# Patient Record
Sex: Female | Born: 1945 | Race: Black or African American | Hispanic: No | Marital: Single | Smoking: Former smoker
Health system: Southern US, Community
[De-identification: ages and names within clinical notes are randomized; demographics above are authoritative.]

## PROBLEM LIST (undated history)

## (undated) DIAGNOSIS — M1990 Unspecified osteoarthritis, unspecified site: Secondary | ICD-10-CM

## (undated) DIAGNOSIS — I10 Essential (primary) hypertension: Secondary | ICD-10-CM

## (undated) DIAGNOSIS — M329 Systemic lupus erythematosus, unspecified: Secondary | ICD-10-CM

## (undated) DIAGNOSIS — E7800 Pure hypercholesterolemia, unspecified: Secondary | ICD-10-CM

## (undated) DIAGNOSIS — G629 Polyneuropathy, unspecified: Secondary | ICD-10-CM

## (undated) DIAGNOSIS — E119 Type 2 diabetes mellitus without complications: Secondary | ICD-10-CM

## (undated) DIAGNOSIS — IMO0002 Reserved for concepts with insufficient information to code with codable children: Secondary | ICD-10-CM

## (undated) HISTORY — PX: BACK SURGERY: SHX140

## (undated) HISTORY — PX: LAPAROSCOPIC TUBAL LIGATION: SUR803

---

## 1999-01-31 ENCOUNTER — Encounter: Payer: Self-pay | Admitting: Cardiology

## 1999-01-31 ENCOUNTER — Ambulatory Visit (HOSPITAL_COMMUNITY): Admission: RE | Admit: 1999-01-31 | Discharge: 1999-01-31 | Payer: Self-pay | Admitting: Cardiology

## 1999-11-11 ENCOUNTER — Emergency Department (HOSPITAL_COMMUNITY): Admission: EM | Admit: 1999-11-11 | Discharge: 1999-11-11 | Payer: Self-pay | Admitting: Emergency Medicine

## 2000-12-13 ENCOUNTER — Other Ambulatory Visit: Admission: RE | Admit: 2000-12-13 | Discharge: 2000-12-13 | Payer: Self-pay | Admitting: Obstetrics and Gynecology

## 2000-12-20 ENCOUNTER — Encounter: Payer: Self-pay | Admitting: Obstetrics and Gynecology

## 2000-12-20 ENCOUNTER — Ambulatory Visit (HOSPITAL_COMMUNITY): Admission: RE | Admit: 2000-12-20 | Discharge: 2000-12-20 | Payer: Self-pay | Admitting: Obstetrics and Gynecology

## 2001-01-03 ENCOUNTER — Encounter: Payer: Self-pay | Admitting: Cardiology

## 2001-01-03 ENCOUNTER — Encounter: Admission: RE | Admit: 2001-01-03 | Discharge: 2001-01-03 | Payer: Self-pay | Admitting: Cardiology

## 2001-02-24 ENCOUNTER — Ambulatory Visit (HOSPITAL_COMMUNITY): Admission: RE | Admit: 2001-02-24 | Discharge: 2001-02-24 | Payer: Self-pay | Admitting: Cardiology

## 2001-02-24 ENCOUNTER — Encounter: Payer: Self-pay | Admitting: Cardiology

## 2001-03-07 ENCOUNTER — Ambulatory Visit (HOSPITAL_COMMUNITY): Admission: RE | Admit: 2001-03-07 | Discharge: 2001-03-07 | Payer: Self-pay | Admitting: Cardiology

## 2001-08-28 ENCOUNTER — Ambulatory Visit (HOSPITAL_COMMUNITY): Admission: RE | Admit: 2001-08-28 | Discharge: 2001-08-28 | Payer: Self-pay | Admitting: *Deleted

## 2001-08-31 ENCOUNTER — Ambulatory Visit (HOSPITAL_COMMUNITY): Admission: RE | Admit: 2001-08-31 | Discharge: 2001-08-31 | Payer: Self-pay | Admitting: Obstetrics and Gynecology

## 2001-08-31 ENCOUNTER — Encounter: Payer: Self-pay | Admitting: Obstetrics and Gynecology

## 2001-09-01 ENCOUNTER — Ambulatory Visit (HOSPITAL_COMMUNITY): Admission: RE | Admit: 2001-09-01 | Discharge: 2001-09-01 | Payer: Self-pay | Admitting: *Deleted

## 2001-11-06 ENCOUNTER — Encounter: Admission: RE | Admit: 2001-11-06 | Discharge: 2001-11-06 | Payer: Self-pay | Admitting: Cardiology

## 2001-11-06 ENCOUNTER — Encounter: Payer: Self-pay | Admitting: Cardiology

## 2001-11-10 ENCOUNTER — Ambulatory Visit (HOSPITAL_COMMUNITY): Admission: RE | Admit: 2001-11-10 | Discharge: 2001-11-10 | Payer: Self-pay | Admitting: Obstetrics and Gynecology

## 2001-11-10 ENCOUNTER — Encounter (INDEPENDENT_AMBULATORY_CARE_PROVIDER_SITE_OTHER): Payer: Self-pay

## 2002-02-23 ENCOUNTER — Ambulatory Visit (HOSPITAL_COMMUNITY): Admission: RE | Admit: 2002-02-23 | Discharge: 2002-02-23 | Payer: Self-pay | Admitting: *Deleted

## 2002-02-23 ENCOUNTER — Encounter (INDEPENDENT_AMBULATORY_CARE_PROVIDER_SITE_OTHER): Payer: Self-pay | Admitting: Specialist

## 2002-03-27 ENCOUNTER — Other Ambulatory Visit: Admission: RE | Admit: 2002-03-27 | Discharge: 2002-03-27 | Payer: Self-pay | Admitting: Obstetrics and Gynecology

## 2002-06-12 ENCOUNTER — Encounter: Payer: Self-pay | Admitting: Cardiology

## 2002-06-12 ENCOUNTER — Encounter: Admission: RE | Admit: 2002-06-12 | Discharge: 2002-06-12 | Payer: Self-pay | Admitting: Cardiology

## 2002-12-17 ENCOUNTER — Encounter: Payer: Self-pay | Admitting: Cardiology

## 2002-12-17 ENCOUNTER — Encounter: Admission: RE | Admit: 2002-12-17 | Discharge: 2002-12-17 | Payer: Self-pay | Admitting: Cardiology

## 2003-04-08 ENCOUNTER — Other Ambulatory Visit: Admission: RE | Admit: 2003-04-08 | Discharge: 2003-04-08 | Payer: Self-pay | Admitting: Obstetrics and Gynecology

## 2003-05-06 ENCOUNTER — Encounter: Payer: Self-pay | Admitting: Cardiology

## 2003-05-06 ENCOUNTER — Encounter: Admission: RE | Admit: 2003-05-06 | Discharge: 2003-05-06 | Payer: Self-pay | Admitting: Cardiology

## 2003-06-05 ENCOUNTER — Ambulatory Visit (HOSPITAL_BASED_OUTPATIENT_CLINIC_OR_DEPARTMENT_OTHER): Admission: RE | Admit: 2003-06-05 | Discharge: 2003-06-05 | Payer: Self-pay | Admitting: Urology

## 2003-06-05 ENCOUNTER — Encounter (INDEPENDENT_AMBULATORY_CARE_PROVIDER_SITE_OTHER): Payer: Self-pay | Admitting: Specialist

## 2004-01-20 ENCOUNTER — Encounter: Admission: RE | Admit: 2004-01-20 | Discharge: 2004-01-20 | Payer: Self-pay | Admitting: Urology

## 2004-01-22 ENCOUNTER — Ambulatory Visit (HOSPITAL_COMMUNITY): Admission: RE | Admit: 2004-01-22 | Discharge: 2004-01-22 | Payer: Self-pay | Admitting: Urology

## 2004-01-22 ENCOUNTER — Ambulatory Visit (HOSPITAL_BASED_OUTPATIENT_CLINIC_OR_DEPARTMENT_OTHER): Admission: RE | Admit: 2004-01-22 | Discharge: 2004-01-22 | Payer: Self-pay | Admitting: Urology

## 2004-04-22 ENCOUNTER — Ambulatory Visit (HOSPITAL_COMMUNITY): Admission: RE | Admit: 2004-04-22 | Discharge: 2004-04-22 | Payer: Self-pay | Admitting: Urology

## 2004-04-22 ENCOUNTER — Ambulatory Visit (HOSPITAL_BASED_OUTPATIENT_CLINIC_OR_DEPARTMENT_OTHER): Admission: RE | Admit: 2004-04-22 | Discharge: 2004-04-22 | Payer: Self-pay | Admitting: Urology

## 2004-07-22 ENCOUNTER — Encounter (INDEPENDENT_AMBULATORY_CARE_PROVIDER_SITE_OTHER): Payer: Self-pay | Admitting: Specialist

## 2004-07-22 ENCOUNTER — Ambulatory Visit (HOSPITAL_BASED_OUTPATIENT_CLINIC_OR_DEPARTMENT_OTHER): Admission: RE | Admit: 2004-07-22 | Discharge: 2004-07-22 | Payer: Self-pay | Admitting: Urology

## 2004-07-22 ENCOUNTER — Ambulatory Visit (HOSPITAL_COMMUNITY): Admission: RE | Admit: 2004-07-22 | Discharge: 2004-07-22 | Payer: Self-pay | Admitting: Urology

## 2005-07-29 ENCOUNTER — Ambulatory Visit: Payer: Self-pay | Admitting: Internal Medicine

## 2005-08-30 ENCOUNTER — Ambulatory Visit: Payer: Self-pay | Admitting: Internal Medicine

## 2005-09-06 ENCOUNTER — Encounter (INDEPENDENT_AMBULATORY_CARE_PROVIDER_SITE_OTHER): Payer: Self-pay | Admitting: *Deleted

## 2005-09-06 ENCOUNTER — Ambulatory Visit: Payer: Self-pay | Admitting: Internal Medicine

## 2006-07-12 ENCOUNTER — Other Ambulatory Visit: Admission: RE | Admit: 2006-07-12 | Discharge: 2006-07-12 | Payer: Self-pay | Admitting: Obstetrics and Gynecology

## 2007-02-03 ENCOUNTER — Encounter: Admission: RE | Admit: 2007-02-03 | Discharge: 2007-02-03 | Payer: Self-pay | Admitting: Urology

## 2007-02-08 ENCOUNTER — Ambulatory Visit (HOSPITAL_BASED_OUTPATIENT_CLINIC_OR_DEPARTMENT_OTHER): Admission: RE | Admit: 2007-02-08 | Discharge: 2007-02-08 | Payer: Self-pay | Admitting: Urology

## 2007-02-08 ENCOUNTER — Encounter (INDEPENDENT_AMBULATORY_CARE_PROVIDER_SITE_OTHER): Payer: Self-pay | Admitting: Specialist

## 2007-02-12 ENCOUNTER — Emergency Department (HOSPITAL_COMMUNITY): Admission: EM | Admit: 2007-02-12 | Discharge: 2007-02-12 | Payer: Self-pay | Admitting: Emergency Medicine

## 2007-05-31 ENCOUNTER — Encounter (INDEPENDENT_AMBULATORY_CARE_PROVIDER_SITE_OTHER): Payer: Self-pay | Admitting: Urology

## 2007-05-31 ENCOUNTER — Ambulatory Visit (HOSPITAL_BASED_OUTPATIENT_CLINIC_OR_DEPARTMENT_OTHER): Admission: RE | Admit: 2007-05-31 | Discharge: 2007-05-31 | Payer: Self-pay | Admitting: Urology

## 2008-03-19 ENCOUNTER — Encounter: Admission: RE | Admit: 2008-03-19 | Discharge: 2008-03-19 | Payer: Self-pay | Admitting: *Deleted

## 2008-08-15 ENCOUNTER — Encounter (INDEPENDENT_AMBULATORY_CARE_PROVIDER_SITE_OTHER): Payer: Self-pay | Admitting: Obstetrics and Gynecology

## 2008-08-15 ENCOUNTER — Ambulatory Visit (HOSPITAL_COMMUNITY): Admission: RE | Admit: 2008-08-15 | Discharge: 2008-08-15 | Payer: Self-pay | Admitting: Obstetrics and Gynecology

## 2008-12-30 ENCOUNTER — Encounter: Admission: RE | Admit: 2008-12-30 | Discharge: 2008-12-30 | Payer: Self-pay | Admitting: Cardiology

## 2009-02-27 ENCOUNTER — Encounter: Admission: RE | Admit: 2009-02-27 | Discharge: 2009-02-27 | Payer: Self-pay | Admitting: Obstetrics and Gynecology

## 2009-05-24 ENCOUNTER — Encounter: Payer: Self-pay | Admitting: Emergency Medicine

## 2009-05-25 ENCOUNTER — Encounter (INDEPENDENT_AMBULATORY_CARE_PROVIDER_SITE_OTHER): Payer: Self-pay | Admitting: Cardiology

## 2009-05-25 ENCOUNTER — Observation Stay (HOSPITAL_COMMUNITY): Admission: EM | Admit: 2009-05-25 | Discharge: 2009-05-30 | Payer: Self-pay | Admitting: Cardiology

## 2009-05-28 ENCOUNTER — Ambulatory Visit: Payer: Self-pay | Admitting: Pulmonary Disease

## 2010-02-27 DIAGNOSIS — Z8603 Personal history of neoplasm of uncertain behavior: Secondary | ICD-10-CM | POA: Insufficient documentation

## 2010-02-27 DIAGNOSIS — H409 Unspecified glaucoma: Secondary | ICD-10-CM | POA: Insufficient documentation

## 2010-02-27 DIAGNOSIS — M329 Systemic lupus erythematosus, unspecified: Secondary | ICD-10-CM | POA: Insufficient documentation

## 2010-05-01 ENCOUNTER — Emergency Department (HOSPITAL_COMMUNITY): Admission: EM | Admit: 2010-05-01 | Discharge: 2010-05-01 | Payer: Self-pay | Admitting: Emergency Medicine

## 2010-11-05 ENCOUNTER — Other Ambulatory Visit: Payer: Self-pay | Admitting: Family Medicine

## 2010-11-05 DIAGNOSIS — Z780 Asymptomatic menopausal state: Secondary | ICD-10-CM

## 2010-11-05 DIAGNOSIS — Z1239 Encounter for other screening for malignant neoplasm of breast: Secondary | ICD-10-CM

## 2010-12-01 ENCOUNTER — Ambulatory Visit
Admission: RE | Admit: 2010-12-01 | Discharge: 2010-12-01 | Disposition: A | Discharge status: Home / Self Care | Payer: PRIVATE HEALTH INSURANCE | Source: Ambulatory Visit | Attending: Family Medicine | Admitting: Family Medicine

## 2010-12-01 DIAGNOSIS — Z780 Asymptomatic menopausal state: Secondary | ICD-10-CM

## 2010-12-01 DIAGNOSIS — Z1239 Encounter for other screening for malignant neoplasm of breast: Secondary | ICD-10-CM

## 2010-12-07 DIAGNOSIS — M8580 Other specified disorders of bone density and structure, unspecified site: Secondary | ICD-10-CM | POA: Insufficient documentation

## 2010-12-26 LAB — URINALYSIS, ROUTINE W REFLEX MICROSCOPIC
Bilirubin Urine: NEGATIVE
Glucose, UA: NEGATIVE mg/dL
Hgb urine dipstick: NEGATIVE
Ketones, ur: NEGATIVE mg/dL
Nitrite: NEGATIVE
Protein, ur: NEGATIVE mg/dL
Specific Gravity, Urine: 1.008 (ref 1.005–1.030)
Urobilinogen, UA: 0.2 mg/dL (ref 0.0–1.0)
pH: 7 (ref 5.0–8.0)

## 2010-12-26 LAB — CBC
HCT: 44.9 % (ref 36.0–46.0)
Hemoglobin: 15.2 g/dL — ABNORMAL HIGH (ref 12.0–15.0)
MCH: 27.4 pg (ref 26.0–34.0)
MCHC: 33.8 g/dL (ref 30.0–36.0)
MCV: 81.1 fL (ref 78.0–100.0)
Platelets: 221 10*3/uL (ref 150–400)
RBC: 5.54 MIL/uL — ABNORMAL HIGH (ref 3.87–5.11)
RDW: 13.5 % (ref 11.5–15.5)
WBC: 6.2 10*3/uL (ref 4.0–10.5)

## 2010-12-26 LAB — POCT I-STAT, CHEM 8
BUN: 5 mg/dL — ABNORMAL LOW (ref 6–23)
Calcium, Ion: 1.08 mmol/L — ABNORMAL LOW (ref 1.12–1.32)
Chloride: 102 mEq/L (ref 96–112)
Creatinine, Ser: 0.9 mg/dL (ref 0.4–1.2)
Glucose, Bld: 105 mg/dL — ABNORMAL HIGH (ref 70–99)
HCT: 47 % — ABNORMAL HIGH (ref 36.0–46.0)
Hemoglobin: 16 g/dL — ABNORMAL HIGH (ref 12.0–15.0)
Potassium: 3.4 mEq/L — ABNORMAL LOW (ref 3.5–5.1)
Sodium: 137 mEq/L (ref 135–145)
TCO2: 26 mmol/L (ref 0–100)

## 2011-01-04 DIAGNOSIS — E1142 Type 2 diabetes mellitus with diabetic polyneuropathy: Secondary | ICD-10-CM | POA: Insufficient documentation

## 2011-01-04 DIAGNOSIS — I10 Essential (primary) hypertension: Secondary | ICD-10-CM | POA: Insufficient documentation

## 2011-01-04 DIAGNOSIS — E119 Type 2 diabetes mellitus without complications: Secondary | ICD-10-CM | POA: Insufficient documentation

## 2011-01-05 DIAGNOSIS — E119 Type 2 diabetes mellitus without complications: Secondary | ICD-10-CM | POA: Insufficient documentation

## 2011-01-05 DIAGNOSIS — E1169 Type 2 diabetes mellitus with other specified complication: Secondary | ICD-10-CM | POA: Insufficient documentation

## 2011-01-16 LAB — ANGIOTENSIN CONVERTING ENZYME: Angiotensin-Converting Enzyme: 31 U/L (ref 9–67)

## 2011-01-16 LAB — COMPREHENSIVE METABOLIC PANEL
ALT: 32 U/L (ref 0–35)
AST: 38 U/L — ABNORMAL HIGH (ref 0–37)
Albumin: 3.9 g/dL (ref 3.5–5.2)
Alkaline Phosphatase: 67 U/L (ref 39–117)
BUN: 10 mg/dL (ref 6–23)
CO2: 29 mEq/L (ref 19–32)
Calcium: 9.5 mg/dL (ref 8.4–10.5)
Chloride: 102 mEq/L (ref 96–112)
Creatinine, Ser: 0.9 mg/dL (ref 0.4–1.2)
GFR calc Af Amer: >60 mL/min (ref >60)
GFR calc non Af Amer: >60 mL/min (ref >60)
Glucose, Bld: 148 mg/dL — ABNORMAL HIGH (ref 70–99)
Potassium: 3.3 mEq/L — ABNORMAL LOW (ref 3.5–5.1)
Sodium: 139 mEq/L (ref 135–145)
Total Bilirubin: 0.5 mg/dL (ref 0.3–1.2)
Total Protein: 7.1 g/dL (ref 6.0–8.3)

## 2011-01-16 LAB — GLUCOSE, CAPILLARY
Glucose-Capillary: 110 mg/dL — ABNORMAL HIGH (ref 70–99)
Glucose-Capillary: 126 mg/dL — ABNORMAL HIGH (ref 70–99)
Glucose-Capillary: 126 mg/dL — ABNORMAL HIGH (ref 70–99)
Glucose-Capillary: 126 mg/dL — ABNORMAL HIGH (ref 70–99)
Glucose-Capillary: 128 mg/dL — ABNORMAL HIGH (ref 70–99)
Glucose-Capillary: 131 mg/dL — ABNORMAL HIGH (ref 70–99)
Glucose-Capillary: 132 mg/dL — ABNORMAL HIGH (ref 70–99)
Glucose-Capillary: 132 mg/dL — ABNORMAL HIGH (ref 70–99)
Glucose-Capillary: 146 mg/dL — ABNORMAL HIGH (ref 70–99)
Glucose-Capillary: 151 mg/dL — ABNORMAL HIGH (ref 70–99)
Glucose-Capillary: 157 mg/dL — ABNORMAL HIGH (ref 70–99)
Glucose-Capillary: 166 mg/dL — ABNORMAL HIGH (ref 70–99)

## 2011-01-16 LAB — BLOOD GAS, ARTERIAL
Acid-Base Excess: 0.9 mmol/L (ref 0.0–2.0)
Bicarbonate: 24.5 mEq/L — ABNORMAL HIGH (ref 20.0–24.0)
Drawn by: 122601
FIO2: 0.21 %
O2 Saturation: 97.1 %
Patient temperature: 98.6
TCO2: 25.6 mmol/L (ref 0–100)
pCO2 arterial: 35.7 mmHg (ref 35.0–45.0)
pH, Arterial: 7.451 — ABNORMAL HIGH (ref 7.350–7.400)
pO2, Arterial: 81 mmHg (ref 80.0–100.0)

## 2011-01-16 LAB — RHEUMATOID FACTOR: Rhuematoid fact SerPl-aCnc: 23 IU/mL — ABNORMAL HIGH (ref 0–20)

## 2011-01-16 LAB — URINALYSIS, ROUTINE W REFLEX MICROSCOPIC
Bilirubin Urine: NEGATIVE
Glucose, UA: NEGATIVE mg/dL
Hgb urine dipstick: NEGATIVE
Ketones, ur: NEGATIVE mg/dL
Nitrite: NEGATIVE
Protein, ur: NEGATIVE mg/dL
Specific Gravity, Urine: 1.01 (ref 1.005–1.030)
Urobilinogen, UA: 0.2 mg/dL (ref 0.0–1.0)
pH: 5 (ref 5.0–8.0)

## 2011-01-16 LAB — DIFFERENTIAL
Basophils Absolute: 0 10*3/uL (ref 0.0–0.1)
Basophils Absolute: 0 10*3/uL (ref 0.0–0.1)
Basophils Relative: 1 % (ref 0–1)
Basophils Relative: 1 % (ref 0–1)
Eosinophils Absolute: 0.1 10*3/uL (ref 0.0–0.7)
Eosinophils Absolute: 0 10*3/uL (ref 0.0–0.7)
Eosinophils Relative: 2 % (ref 0–5)
Eosinophils Relative: 0 % (ref 0–5)
Lymphocytes Relative: 49 % — ABNORMAL HIGH (ref 12–46)
Lymphocytes Relative: 26 % (ref 12–46)
Lymphs Abs: 2.5 10*3/uL (ref 0.7–4.0)
Lymphs Abs: 2.6 10*3/uL (ref 0.7–4.0)
Monocytes Absolute: 0.4 10*3/uL (ref 0.1–1.0)
Monocytes Absolute: 0.5 10*3/uL (ref 0.1–1.0)
Monocytes Relative: 8 % (ref 3–12)
Monocytes Relative: 5 % (ref 3–12)
Neutro Abs: 2 10*3/uL (ref 1.7–7.7)
Neutro Abs: 6.7 10*3/uL (ref 1.7–7.7)
Neutrophils Relative %: 40 % — ABNORMAL LOW (ref 43–77)
Neutrophils Relative %: 68 % (ref 43–77)

## 2011-01-16 LAB — CBC
HCT: 42 % (ref 36.0–46.0)
HCT: 46.5 % — ABNORMAL HIGH (ref 36.0–46.0)
Hemoglobin: 14 g/dL (ref 12.0–15.0)
Hemoglobin: 15.2 g/dL — ABNORMAL HIGH (ref 12.0–15.0)
MCHC: 33.4 g/dL (ref 30.0–36.0)
MCHC: 32.6 g/dL (ref 30.0–36.0)
MCV: 82.3 fL (ref 78.0–100.0)
MCV: 82 fL (ref 78.0–100.0)
Platelets: 220 10*3/uL (ref 150–400)
Platelets: 272 10*3/uL (ref 150–400)
RBC: 5.1 MIL/uL (ref 3.87–5.11)
RBC: 5.67 MIL/uL — ABNORMAL HIGH (ref 3.87–5.11)
RDW: 13.9 % (ref 11.5–15.5)
RDW: 14.1 % (ref 11.5–15.5)
WBC: 5 10*3/uL (ref 4.0–10.5)
WBC: 9.8 10*3/uL (ref 4.0–10.5)

## 2011-01-16 LAB — SEDIMENTATION RATE: Sed Rate: 11 mm/hr (ref 0–22)

## 2011-01-16 LAB — LIPASE, BLOOD
Lipase: 20 U/L (ref 11–59)
Lipase: 21 U/L (ref 11–59)

## 2011-01-16 LAB — CREATININE, SERUM
Creatinine, Ser: 0.77 mg/dL (ref 0.4–1.2)
GFR calc Af Amer: >60 mL/min (ref >60)
GFR calc non Af Amer: >60 mL/min (ref >60)

## 2011-01-16 LAB — URINE MICROSCOPIC-ADD ON

## 2011-01-16 LAB — POCT CARDIAC MARKERS
CKMB, poc: 1.4 ng/mL (ref 1.0–8.0)
Myoglobin, poc: 96.6 ng/mL (ref 12–200)
Troponin i, poc: <0.05 ng/mL (ref 0.00–0.09)

## 2011-01-16 LAB — CALCIUM: Calcium: 9.1 mg/dL (ref 8.4–10.5)

## 2011-01-16 LAB — C-REACTIVE PROTEIN: CRP: 0.9 mg/dL — ABNORMAL HIGH (ref <0.6)

## 2011-01-16 LAB — AMYLASE: Amylase: 58 U/L (ref 27–131)

## 2011-01-16 LAB — ANA: Anti Nuclear Antibody(ANA): NEGATIVE

## 2011-01-16 LAB — ANTI-DNA ANTIBODY, DOUBLE-STRANDED: ds DNA Ab: <1 IU/mL (ref <5)

## 2011-01-17 LAB — LIPID PANEL
Cholesterol: 209 mg/dL — ABNORMAL HIGH (ref 0–200)
HDL: 29 mg/dL — ABNORMAL LOW (ref >39)
LDL Cholesterol: 135 mg/dL — ABNORMAL HIGH (ref 0–99)
Total CHOL/HDL Ratio: 7.2 RATIO
Triglycerides: 225 mg/dL — ABNORMAL HIGH (ref <150)
VLDL: 45 mg/dL — ABNORMAL HIGH (ref 0–40)

## 2011-01-17 LAB — DIFFERENTIAL
Basophils Absolute: 0.1 10*3/uL (ref 0.0–0.1)
Basophils Relative: 1 % (ref 0–1)
Eosinophils Absolute: 0 10*3/uL (ref 0.0–0.7)
Eosinophils Relative: 0 % (ref 0–5)
Lymphocytes Relative: 37 % (ref 12–46)
Lymphs Abs: 2.7 10*3/uL (ref 0.7–4.0)
Monocytes Absolute: 0.4 10*3/uL (ref 0.1–1.0)
Monocytes Relative: 5 % (ref 3–12)
Neutro Abs: 4.1 10*3/uL (ref 1.7–7.7)
Neutrophils Relative %: 56 % (ref 43–77)

## 2011-01-17 LAB — COMPREHENSIVE METABOLIC PANEL
ALT: 25 U/L (ref 0–35)
AST: 33 U/L (ref 0–37)
Albumin: 3.2 g/dL — ABNORMAL LOW (ref 3.5–5.2)
Alkaline Phosphatase: 56 U/L (ref 39–117)
BUN: 8 mg/dL (ref 6–23)
CO2: 24 mEq/L (ref 19–32)
Calcium: 8.7 mg/dL (ref 8.4–10.5)
Chloride: 102 mEq/L (ref 96–112)
Creatinine, Ser: 0.9 mg/dL (ref 0.4–1.2)
GFR calc Af Amer: >60 mL/min (ref >60)
GFR calc non Af Amer: >60 mL/min (ref >60)
Glucose, Bld: 185 mg/dL — ABNORMAL HIGH (ref 70–99)
Potassium: 3.4 mEq/L — ABNORMAL LOW (ref 3.5–5.1)
Sodium: 135 mEq/L (ref 135–145)
Total Bilirubin: 0.5 mg/dL (ref 0.3–1.2)
Total Protein: 5.9 g/dL — ABNORMAL LOW (ref 6.0–8.3)

## 2011-01-17 LAB — BRAIN NATRIURETIC PEPTIDE: Pro B Natriuretic peptide (BNP): 37 pg/mL (ref 0.0–100.0)

## 2011-01-17 LAB — CARDIAC PANEL(CRET KIN+CKTOT+MB+TROPI)
CK, MB: 1.4 ng/mL (ref 0.3–4.0)
CK, MB: 1.4 ng/mL (ref 0.3–4.0)
CK, MB: 1.6 ng/mL (ref 0.3–4.0)
Relative Index: 1.1 (ref 0.0–2.5)
Relative Index: 1.3 (ref 0.0–2.5)
Relative Index: 1.6 (ref 0.0–2.5)
Total CK: 102 U/L (ref 7–177)
Total CK: 105 U/L (ref 7–177)
Total CK: 122 U/L (ref 7–177)
Troponin I: 0.01 ng/mL (ref 0.00–0.06)
Troponin I: 0.01 ng/mL (ref 0.00–0.06)
Troponin I: 0.02 ng/mL (ref 0.00–0.06)

## 2011-01-17 LAB — CBC
HCT: 38.7 % (ref 36.0–46.0)
Hemoglobin: 13 g/dL (ref 12.0–15.0)
MCHC: 33.6 g/dL (ref 30.0–36.0)
MCV: 81.1 fL (ref 78.0–100.0)
Platelets: 204 10*3/uL (ref 150–400)
RBC: 4.77 MIL/uL (ref 3.87–5.11)
RDW: 14.5 % (ref 11.5–15.5)
WBC: 7.2 10*3/uL (ref 4.0–10.5)

## 2011-01-17 LAB — PROTIME-INR
INR: 1.1 (ref 0.00–1.49)
Prothrombin Time: 14.2 seconds (ref 11.6–15.2)

## 2011-01-17 LAB — GLUCOSE, CAPILLARY
Glucose-Capillary: 130 mg/dL — ABNORMAL HIGH (ref 70–99)
Glucose-Capillary: 173 mg/dL — ABNORMAL HIGH (ref 70–99)

## 2011-01-17 LAB — APTT: aPTT: 36 seconds (ref 24–37)

## 2011-01-17 LAB — TSH: TSH: 0.31 u[IU]/mL — ABNORMAL LOW (ref 0.350–4.500)

## 2011-02-23 NOTE — Op Note (Signed)
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   OPERATION:  Cysto, transurethral resection of bladder tumor (small 2  cm).   ANESTHESIA:  General.   SURGEON:  Corky Downs, M.D.   BRIEF HISTORY:  A 65 year old black female presented with possible  recurrent cancer or CIS.  She has some inflammatory lesions of the right  base of the bladder which were fairly localized.  She had first her  bladder tumor 01/2003 had recurrent 7/05 and 10/05, has been on BCG  since then. had a negative biopsy for in 2004 and again 4/08.  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  She has no irritative  symptoms but does have some localized changes on cystoscopy and blood in  her urine.  She enters for biopsy of this area.   The patient was placed on the operating table in dorsal lithotomy  position, underwent time-out procedure, was given IV Cipro.  Prepped and  draped with Betadine in the usual sterile fashion.  The panendoscope was  inserted and bladder inspected.  The area over the right base was  photographed where the inflammatory lesions were seen.  The left base  where the previous lesions were seen just four months ago looked really  pretty benign.  I used a cold cup biopsy forceps and removed about 2 cm  of tissue on the right base.  I sent this for pathological examination.  I used the Bugbee electrode to fulgurate that and also some of the other  red areas that I saw.  Hemostasis was good.  I  drained the bladder,  removed the catheter and was took her to the recovery room in good  condition, be later discharged as an outpatient.  If she does well, I  may or may not continue the BCG if this is a BCG reaction causing this  irritation.      Corky Downs, M.D.  Electronically Signed     HMK/MEDQ  D:  05/31/2007  T:  06/01/2007  Job:  [id number]

## 2011-02-23 NOTE — Op Note (Signed)
Deborah Weeks, Deborah Weeks               ACCOUNT NO.:  [id number]   MEDICAL RECORD NO.:  [id number]          PATIENT TYPE:  AMB   LOCATION:  [place]                           FACILITY:  [facility]   PHYSICIAN:  Eli Hose, M.D.DATE OF BIRTH:  [birth year]   DATE OF PROCEDURE:  08/15/2008  DATE OF DISCHARGE:                               OPERATIVE REPORT   PREOPERATIVE DIAGNOSES:  1. Irregular uterine bleeding.  2. Endometrial polyps.   POSTOPERATIVE DIAGNOSES:  1. Irregular uterine bleeding.  2. Endometrial polyps.   PROCEDURES:  1. Hysteroscopy with resection of polyps.  2. Dilatation and curettage.  3. NovaSure ablation of the endometrium.   SURGEON:  Eli Hose, MD   FIRST ASSISTANT:  None.   ANESTHETIC:  General.   DISPOSITION:  Deborah Weeks is a 65 year old female who reports having  normal monthly cycles even at this age.  An endometrial biopsy has been  benign.  The patient had a hydrosonogram performed, which showed  endometrial polyps.  She presents at this time for the above-mentioned  procedure.  She understands the indications for her surgical procedure  and she accepts the risks of, but not limited to, anesthetic  complications, bleeding, infections, and possible damage to the  surrounding organs.   FINDINGS:  A normal-sized uterus was encountered.  No adnexal masses  were appreciated.  On hysteroscopy, the patient was found to have  multiple endometrial polyps.  The largest polyp measured approximately  1.5 cm.  No other pathology was noted.   PROCEDURE:  The patient was taken to the operating room where a general  anesthetic was given.  The patient's lower abdomen, perineum, and vagina  were prepped with multiple layers of Betadine.  The bladder was drained  of urine.  The patient was then sterilely draped.  Examination under  anesthesia was performed.  A paracervical block was placed using 10 mL  of 0.5% Marcaine with epinephrine.  An endocervical curettage  was  obtained.  The uterus sounded to 8 cm.  The cervix was gently dilated.  The diagnostic hysteroscope was inserted and the polyps were noted as  mentioned above.  The uterine cavity was then cleaned using Randall  stone forceps and then using a medium sharp curette.  Hysteroscopy was  repeated and the patient was noted to still have polyps on the inside.  We then irrigated the uterine cavity.  The operative hysteroscope was  inserted.  We used a single loop to then resect the remaining polyps.  Care was taken not to penetrate the myometrium.  We then irrigated the  uterine cavity once again.  The NovaSure apparatus was then inserted and  proper placement was confirmed.  The cervical length was 3 cm.  The  cavity length was 5 cm.  The cervical width was 3.8 cm.  A test  procedure was done and the integrity of the uterine cavity was  confirmed.  We then ablated the uterine cavity for 60 seconds until it  was completely ablated.  The patient tolerated her procedure well.  Hemostasis was adequate.  All instruments were removed.  The exam was  repeated and the uterus was noted to be firm.  The patient was awakened  from her anesthetic and then taken to the recovery room in stable  condition.  The estimated blood loss was 10 mL.  The estimated fluid  deficit was 100 mL.  The endocervical curettage, endometrial polyps, and  endometrial curettings were sent to pathology for evaluation.   FOLLOWUP INSTRUCTIONS:  The patient will return to see Dr. Raphael Gibney in 2  weeks for followup examination.  She was given a prescription for  Darvocet and she can take 1 or 2 tablets every 4 hours as needed for  severe pain.  She will take regular Tylenol 1 tablet or 2 tablets every  6 hours as needed for mild-to-moderate pain.  She was given a copy of  the postoperative instruction sheet as prepared by the [facility] for patients who have undergone a dilatation and  curettage.       Eli Hose, M.D.  Electronically Signed     AVS/MEDQ  D:  08/15/2008  T:  08/16/2008  Job:  [id number]   cc:   Ardyth Gal. Spruill, M.D.  Fax: [phone]

## 2011-02-25 ENCOUNTER — Other Ambulatory Visit: Payer: Self-pay | Admitting: Family Medicine

## 2011-02-26 NOTE — Op Note (Signed)
Deborah Weeks, Deborah Weeks                         ACCOUNT NO.:  [id number]   MEDICAL RECORD NO.:  [id number]                   PATIENT TYPE:  AMB   LOCATION:  [facility]                                 FACILITY:  [facility]   PHYSICIAN:  Corky Downs, M.D.          DATE OF BIRTH:  Nov 10, 1945   DATE OF PROCEDURE:  06/05/2003  DATE OF DISCHARGE:                                 OPERATIVE REPORT   PREOPERATIVE DIAGNOSES:  Hematuria, bladder tumor.   POSTOPERATIVE DIAGNOSES:  Hematuria, bladder tumor.   PROCEDURE:  Cystoscopy, transurethral resection of 3.5 cm bladder tumor.   SURGEON:  Corky Downs, M.D.   ASSISTANT:  Berdie Ogren, MD   ANESTHESIA:  General endotracheal.   DRAINS:  64 French Foley catheter straight drainage.   SPECIMENS:  Bladder tumor to pathology.   INDICATIONS FOR PROCEDURE:  Deborah Weeks is a 65 year old African-American  female who has recently been evaluated for hematuria. She underwent office  cystoscopy and CT scan which both demonstrated a 3.5 cm bladder tumor at the  right lateral wall. She was evaluated with an IVP which demonstrated normal  upper tracts. The patient does have a smoking history of approximately 1/2  pack of cigarettes per day. After understanding the risks and benefits, the  patient consented to cystoscopy with transurethral resection of bladder  tumor.   DESCRIPTION OF PROCEDURE:  The patient was taken to the operating room and  correctly identified by her identification bracelet. She was given  preoperative antibiotics and general endotracheal anesthesia. She was placed  in the dorsal lithotomy position and prepped and draped in the typical  sterile fashion. A 28 French resectoscope sheath was placed into the bladder  with obturator. The obturator was removed a cystoscope was placed into the  sheath. Careful cystoscopy was performed and again demonstrated the right  lateral wall bladder tumor which was papillary in nature. The  tumor appeared  to be on a small stalk. The rest of the bladder mucosa was carefully  surveyed and demonstrated no suspicious lesions. Bilateral ureteral orifices  were identified in the normal anatomic location. The cystoscope was removed  and the resectoscope was placed into the bladder. The cutting current was  used to excise the small stalk supporting the bladder tumor. The bladder  tumor was broken up into several pieces and removed from the bladder. The  bladder was carefully irrigated to get all of the tumor fragments out of the  bladder. The base of the stalk was then carefully fulgurated with the  coagulation current. The resection did involve the muscular wall of the  bladder. There was very little bleeding and the resection bed was assessed  with the flow off to ensure that no obvious bleeding was seen. The bladder  was then drained and a 16 French Foley catheter was  placed. This was irrigated several times and returned clear urine. The  patient was then allowed to  awaken and did so without complications. She was  taken to post anesthesia care unit in stable condition. Please note that Dr.  Serita Butcher was the primary surgeon and participated in all aspects of this  case.     Berdie Ogren, MD                           Corky Downs, M.D.    DR/MEDQ  D:  06/05/2003  T:  06/05/2003  Job:  [id number]

## 2011-02-26 NOTE — Op Note (Signed)
NAMEDARLISA, WALKENHORST               ACCOUNT NO.:  [id number]   MEDICAL RECORD NO.:  [id number]          PATIENT TYPE:  AMB   LOCATION:  [facility]                         [facility]ACILITY:  [facility]   PHYSICIAN:  Shanda Bumps., M.D.DATE OF BIRTH:  1946/05/24   DATE OF PROCEDURE:  07/22/2004  DATE OF DISCHARGE:                                 OPERATIVE REPORT   PREOPERATIVE DIAGNOSIS:  Recurrent bladder tumors, anterior bladder wall,  left anterior left bladder wall.   PROCEDURE:  Transurethral resection of bladder tumors (greater than 3 cm).   ANESTHESIA:  General.   SURGEON:  Corky Downs, M.D.   BRIEF HISTORY:  This 65 year old female is admitted with recurrent bladder  tumors for TURBT.  She had her first tumor, which was a grade 1 TCC of the  bladder, August 2004.  She had a recurrence July 2005 but had a negative  biopsy December 2004, which just showed chronic cystitis.  On routine office  cystoscopy she had no blood in her urine but had numerous, greater than 10-  15, bladder tumors on the anterior wall of her bladder, mainly on the right  side but also on the left anterior lateral wall as well.  She is to have  these resected at this time.   The patient was placed on the operating table in the dorsal lithotomy  position after satisfactory induction of general anesthesia with LMA and was  prepped and draped in the usual sterile fashion.  A #22 panendoscope was  inserted and the bladder inspected.  There were several hyperemic areas of  the bladder, but the only tumors were at the anterior wall mainly on the  right side but also on the left anterior and lateral wall.  These were  photographed.  Then using the cold cup biopsy forceps, the tumors mainly on  the right side were then resected.  There was an area of greater than 3 cm  in toto that was resected and then a few other satellite lesions as well.  After this was done, the right side was then fulgurated with the  Bugbee  electrode until hemostasis was good.  Then the left side was done and there  were fewer in number but still probably six or seven on this side that were  also excised with the cold cup biopsy forceps.  Again, the base was then  fulgurated with the Bugbee and the hemostasis was quite good. I did not feel  like I needed to leave a catheter, drained the bladder, put in a B&O  suppository, and she was taken to the recovery room in good condition.  She  will be later discharged as an outpatient if she is able to void, and we  will plan some BCG therapy when she recovers from this.      HMK/MEDQ  D:  07/22/2004  T:  07/22/2004  Job:  [id number]

## 2011-02-26 NOTE — Op Note (Signed)
[facility]  Patient:    Deborah Weeks, Deborah Weeks Visit Number: [id number] MRN: [id number]          Service Type: DSU Location: [facility] Attending Physician:  Gildardo Cranker Dictated by:   Eli Hose, M.D. Proc. Date: 11/10/01 Admit Date:  11/10/2001   CC:         Ardyth Gal. Spruill, M.D.  Allegra Lai. Terrence Dupont, M.D.   Operative Report  PREOPERATIVE DIAGNOSIS:       Postmenopausal bleeding.  POSTOPERATIVE DIAGNOSES:      1. Postmenopausal bleeding.                               2. Uterine polyp.  OPERATION:                    Hysteroscopy with dilatation and curettage.  SURGEON:                      Eli Hose, M.D.  ANESTHESIA:                   General.  DISPOSITION:                  The patient is a 65 year old female who presents with the above mentioned diagnosis.  She understands the indications for her procedure, and she accepts the risks of, but not limited to anesthetic complications, bleeding, infection, and possible damage to the surrounding organs.  FINDINGS:                     The uterus was eight to ten weeks size, and there was a question of a fibroid on the right anterior uterus.  There was a 1 cm endometrial polyp noted on hysteroscopy.  There was no evidence of cancer or any other pathology inside the uterine cavity.  DESCRIPTION OF PROCEDURE:     The patient was taken to the operating room where a general anesthetic was given.  The patients perineum and vagina were prepped with multiple layers of Betadine.  The bladder was drained of urine. Examination under anesthesia was performed.  The patient was sterilely draped. A paracervical block was placed using 10 cc of 0.25% Marcaine.  An endocervical curettage was performed.  The uterus was sounded to 7.5 cm.  The cervix was gradually dilated.  The uterine cavity was then inspected using the diagnostic hysteroscope.  The above mentioned polyp was noted.  The cavity  was then cleaned using a Randall stone forceps, a ring forceps, and then a medium sharp curet.  The cavity was felt to be clean at the end of our procedure. The hysteroscope was again inserted and no other polyps were noted.  No other pathology was noted.  All instruments were removed.  The patient was awakened from her anesthetic and taken to the recovery room in stable condition.  The estimated blood loss was 30 cc.  The fluid deficit was 80 cc.  The patient tolerated her procedure well.  FOLLOWUP INSTRUCTIONS:        The patient was given a prescription for Darvocet-N 100 and she will take one tablet every four hours as needed for pain (20 tablets total), no refills.  The patient will return to see Dr. Raphael Gibney in two to three weeks for follow up examination.  She was given a copy of the postoperative instruction sheet as  prepared by the [facility] for patients who have undergone a dilatation and curettage. Dictated by:   Eli Hose, M.D. Attending Physician:  Gildardo Cranker DD:  11/10/01 TD:  11/10/01 Job: [id number]

## 2011-02-26 NOTE — Op Note (Signed)
NAMESHARINE, Deborah Weeks               ACCOUNT NO.:  [id number]   MEDICAL RECORD NO.:  [id number]          PATIENT TYPE:  AMB   LOCATION:  [facility]                         FACILITY:  [place]:  Corky Downs, M.D.DATE OF BIRTH:  10/16/1945   DATE OF PROCEDURE:  02/08/2007  DATE OF DISCHARGE:                               OPERATIVE REPORT   PREOPERATIVE DIAGNOSIS:  Possible recurrent bladder cancer, left lateral  wall.   POSTOPERATIVE DIAGNOSIS:  Possible recurrent bladder cancer, left  lateral wall.   OPERATION:  1. Cystoscopy.  2. Transurethral resection of bladder tumor (2 cm), left lateral wall.   ANESTHESIA:  General.   SURGEON:  Corky Downs, M.D.   BRIEF HISTORY:  This 65 year old black female continues to smoke, has  hematuria and had some atypical cytology on cystoscopy.  She had some  velvety lesions on the left lateral wall and she enters for resection of  these lesions.  She does have history of bladder tumor and the first one  was April 2004, July 2005, October 2005 and had a negative biopsy in  2004.  She has been on BCG maintenance; the last was January 2008.  All  the tumors have been grade 1 TCC.   DESCRIPTION OF PROCEDURE:  The patient was placed on the operating table  in the dorsal lithotomy position after satisfactory induction of general  anesthesia.  She was prepped and draped in the usual sterile fashion and  given IV antibiotics.  Bladder was inspected using the right-angle and  the 4 oblique lens.  The lesions on the left lateral wall were somewhat  velvety in appearance and were photographed.  There were several, the  largest of which was about and 1.5 to 2 cm in diameter.  After  photographing these, I used the cold cup biopsy forceps and was able to  completely remove all of these lesions and used the Bugbee with  coagulation to achieve good hemostasis.  The lesions were then  photographed again that were biopsied.  The bladder was  drained,  irrigated with saline, was found to be clear and a Foley catheter was  not left.  She was taken to the recovery room in good condition, given  IV Toradol and will be later discharged as an outpatient and followed up  in the office in a week.      Corky Downs, M.D.  Electronically Signed     HMK/MEDQ  D:  02/08/2007  T:  02/08/2007  Job:  [id number]

## 2011-02-26 NOTE — Cardiovascular Report (Signed)
[facility]  Patient:    Deborah Weeks, Deborah Weeks                      MRN: [id number] Proc. Date: 03/07/01 Adm. Date:  [id number] Attending:  Clent Demark CC:         Ardyth Gal. Spruill, M.D.  Cardiac Catheterization Laboratory   Cardiac Catheterization  PROCEDURE:  Left cardiac catheterization with selective left and right coronary angiography, left ventriculography via the right groin using Judkins technique.  INDICATIONS FOR PROCEDURE:  The patient is a 65 year old, black female, with a past medical history significant for hypertension, hypothyroidism, lupus, complains of retrosternal chest pain associated with shortness of breath with exertion, relieved with rest.  States lately she feels weak and tired and has no energy.  The patient also gives a history of exertional dyspnea.  Denies palpitations, lightheadedness or syncope.  Denies PND, orthopnea or leg swelling.  Denies paroxysmal nocturnal angina.  The patient underwent an adenosine Cardiolite on Feb 24, 2001, which showed small area of reversible ischemia in the anterolateral wall with ejection fraction of 75%.  Due to recurrent chest pain, positive adenosine Cardiolite, the patient is advised for left catheterization, possible PTCA and stenting.  PAST MEDICAL HISTORY:  As above.  PAST SURGICAL HISTORY:  She had tubal ligation many years ago.  She had back surgery for a questionable herniated disk.  SOCIAL HISTORY:  She is single, has three children.  Smoked a half-pack per day for 36 years.  No history of alcohol or drug abuse.  The patient presently on disability.  FAMILY HISTORY:  Mother died of complications of congestive heart failure. She was 73.  She had significant coronary artery disease.  Father died of CA of lung.  She has one brother and sister, both are hypertensive.  ALLERGIES:  She has intolerance to CODEINE.  MEDICATIONS:  She is on atenolol 100 mg p.o. q.d.,  Synthroid 0.125 mg p.o. q.d., Norvasc 10 mg p.o. q.d., Lasix 20 mg p.o. q.d., aspirin 325 mg p.o. q.d., Valium 5 mg p.o. t.i.d., and Nitrostat sublingual as needed.  PHYSICAL EXAMINATION:  On examination, she is alert, awake, oriented x3 in no acute distress.  Blood pressure was 140/80, pulse was 76 regular.  Conjunctiva was pink.  Neck: Supple, no JVD, no bruits.  Lungs are clear to auscultation without rhonchi or rales.  Cardiovascular examination: S1 and S2 was normal. There was no S3 gallop.  There was soft systolic murmur at the apex.  Abdomen: Soft, bowel sounds are present.  Nontender.  Extremities: There was no clubbing, cyanosis or edema.  IMPRESSION:  New onset angina, positive adenosine Cardiolite, hypertension, hypothyroidism, tobacco abuse, positive family history of coronary artery disease.  I discussed with patient regarding left catheterization, possible PTCA with stenting, its risks, i.e., death, MI, stroke, need for emergency CABG, risk of restenosis, local vascular complications, etc., and consented for the procedure.  DESCRIPTION OF PROCEDURE:  After obtaining the informed consent, the patient was brought to the catheterization lab and was placed on the fluoroscopy table.  The right groin was prepped and draped in the usual fashion. Xylocaine 2% was used for local anesthesia in the right groin.  With the help of at thin-walled needle, a 6 French arterial sheath was placed.  The sheath was aspirated and flushed.  Next, a 11 French left Judkins catheter was advanced over the wire under fluoroscopic guidance up to the ascending aorta. The wire was  pulled out, the catheter was aspirated and connected to the manifold.  The catheter was further advanced and engaged into the left coronary ostium.  Multiple views of the left system were taken.  Next, the catheter was disengaged and was pulled out over the wire and was replaced with a 6 French right Judkins catheter, which  was advanced over the wire under fluoroscopic guidance up to the ascending aorta.  The wire was pulled out, the catheter was aspirated and connected to the manifold.  The catheter was further advanced and engaged into the right coronary ostium.  Multiple views of the right system were taken.  Next, the catheter was disengaged and was pulled out over the wire and was replaced with a 6 French pigtail catheter which was advanced over the wire under fluoroscopic guidance up to the ascending aorta.  The wire was pulled out, the catheter was aspirated and connected to the manifold.  The catheter was further advanced across the aortic valve into the LV.  LV pressures were recorded.  Next, left ventriculography was done in 30 degree RAO position.  Post cinegraphic pressures were recorded from LV and then pullback pressures were recorded from the aorta.  There was no significant gradient across the aortic valve.  Next, the pigtail catheter was pulled out, the sheaths were aspirated and flushed. Arteriotomy was closed with Perclose at the end of the procedure without any complications.  FINDINGS:  The patient has good LV systolic function.  Ejection fraction is about 65-70%.  Left main was patent.  LAD had 10-15% mid stenosis.  Diagonal #1 and diagonal #2 are patent.  Left circumflex is patent proximally and then tapers down in AV groove after giving off OM-2.  OM-1 is large which is patent.  OM-2 is medium sized which is patent.  RCA is patent proximally and midportion and is diffusely diseased which is the vessel site of less than 1 mm distally.  The patient tolerated the procedure well.  There were no complications.  PLAN:  To treat medically.  The patient was transferred to recovery room in stable condition. DD:  03/07/01 TD:  03/07/01 Job: [id number]

## 2011-02-26 NOTE — H&P (Signed)
[place]  Patient:    Deborah Weeks, Deborah Weeks Visit Number: [id number] MRN: [id number]          Service Type: END Location: ENDO Attending Physician:  Woody Seller Dictated by:   Eli Hose, M.D. Admit Date:  09/01/2001 Discharge Date: 09/01/2001   CC:         Deborah Weeks, M.D.  Woody Seller., M.D.   History and Physical  HISTORY OF PRESENT ILLNESS:   [name] is a 65 year old female, para 3-0-5-3, who presents for hysteroscopy and D&C.  The patient has a history of postmenopausal bleeding.  She has had a polyp removed that was benign.  A prior endometrial biopsy was negative.  Her most recent Pap smear was within normal limits.  An ultrasound showed marked abnormal endometrial thickening.  The ovaries appeared normal and there was no evidence of adnexal disease.  PAST MEDICAL HISTORY:         The patient has a history of systemic Lupus erythematosus.  She has hypertension, angina, mitral valve prolapse, recurrent kidney infections, arthritis, thyroid disease, and degenerative disk disease. She is taking multiple medications.  DRUG ALLERGIES:               CODEINE.  OBSTETRICAL HISTORY:          The patient has had three term vaginal deliveries.  She has had four miscarriages.  She has had one elective pregnancy termination.  SOCIAL HISTORY:               The patient currently does not work.  She is not married.  She smokes one-half pack of cigarette each day.  She denies alcohol use and recreational drug use.  REVIEW OF SYSTEMS:            Please see Past Medical History.  FAMILY HISTORY:               The patients mother has heart disease, kidney infections, intestinal disease, and she has had strokes.  She also has joint problems.  The patients father has a history of cancer.  PHYSICAL EXAMINATION:         Weight is 190 pounds.  HEENT:                        Within normal limits.  CHEST:                         Shows decreased sounds in the bases.  HEART:                        Regular rate and rhythm and there is a click present.  BREASTS:                      Without masses.  ABDOMEN:                      Nontender.  EXTREMITIES:                  Within normal limits.  NEUROLOGIC EXAMINATION:       Grossly normal.  PELVIC EXAMINATION:           External genitalia are normal, vagina is normal - except for relaxation. The cervix is within normal limits.  The uterus is upper limits of normal size.  Adnexa - no masses  and rectovaginal examination confirms.  ASSESSMENT:                   1. postmenopausal bleeding.                               2. Multiple medical problems.  PLAN:                         The patient has been cleared for surgery by Dr. Montez Morita.  We will perform hysteroscopy and dilatation and curettage.  Because the patient has a history of mitral valve prolapse, we will plan to give her antibiotics preoperatively.  The patient understands the indications for her procedure and she accepts the risks of, but not limited to, anesthetic complications, bleeding, infection, and possible damage to the surrounding organs. Dictated by:   Eli Hose, M.D. Attending Physician:  Woody Seller DD:  11/09/01 TD:  11/09/01 Job: [id number]

## 2011-03-02 ENCOUNTER — Ambulatory Visit
Admission: RE | Admit: 2011-03-02 | Discharge: 2011-03-02 | Disposition: A | Discharge status: Home / Self Care | Payer: PRIVATE HEALTH INSURANCE | Source: Ambulatory Visit | Attending: Family Medicine | Admitting: Family Medicine

## 2011-04-22 DIAGNOSIS — E559 Vitamin D deficiency, unspecified: Secondary | ICD-10-CM | POA: Insufficient documentation

## 2011-07-13 LAB — COMPREHENSIVE METABOLIC PANEL
ALT: 42 — ABNORMAL HIGH
AST: 49 — ABNORMAL HIGH
Albumin: 4.1
Alkaline Phosphatase: 58
BUN: 4 — ABNORMAL LOW
CO2: 25
Calcium: 9.4
Chloride: 103
Creatinine, Ser: 0.73
GFR calc Af Amer: >60
GFR calc non Af Amer: >60
Glucose, Bld: 120 — ABNORMAL HIGH
Potassium: 3.8
Sodium: 137
Total Bilirubin: 0.6
Total Protein: 6.9

## 2011-07-13 LAB — CBC
HCT: 45.6
Hemoglobin: 15
MCHC: 32.9
MCV: 81.4
Platelets: 261
RBC: 5.6 — ABNORMAL HIGH
RDW: 13.8
WBC: 5.8

## 2011-07-13 LAB — URINALYSIS, ROUTINE W REFLEX MICROSCOPIC
Bilirubin Urine: NEGATIVE
Glucose, UA: NEGATIVE
Hgb urine dipstick: NEGATIVE
Ketones, ur: NEGATIVE
Nitrite: NEGATIVE
Protein, ur: NEGATIVE
Specific Gravity, Urine: 1.01
Urobilinogen, UA: 0.2
pH: 5.5

## 2011-07-23 LAB — URINALYSIS, ROUTINE W REFLEX MICROSCOPIC
Bilirubin Urine: NEGATIVE
Glucose, UA: NEGATIVE
Hgb urine dipstick: NEGATIVE
Ketones, ur: NEGATIVE
Nitrite: NEGATIVE
Protein, ur: NEGATIVE
Specific Gravity, Urine: 1.005
Urobilinogen, UA: 0.2
pH: 6

## 2011-07-23 LAB — BASIC METABOLIC PANEL
BUN: 5 — ABNORMAL LOW
CO2: 26
Calcium: 9.9
Chloride: 97
Creatinine, Ser: 0.78
GFR calc Af Amer: >60
GFR calc non Af Amer: >60
Glucose, Bld: 101 — ABNORMAL HIGH
Potassium: 4.7
Sodium: 134 — ABNORMAL LOW

## 2011-07-23 LAB — CBC
HCT: 46.3 — ABNORMAL HIGH
Hemoglobin: 15.4 — ABNORMAL HIGH
MCHC: 33.2
MCV: 79.7
Platelets: 297
RBC: 5.8 — ABNORMAL HIGH
RDW: 12.9
WBC: 5.6

## 2012-05-30 DIAGNOSIS — J309 Allergic rhinitis, unspecified: Secondary | ICD-10-CM | POA: Insufficient documentation

## 2012-08-30 DIAGNOSIS — E042 Nontoxic multinodular goiter: Secondary | ICD-10-CM | POA: Insufficient documentation

## 2012-09-14 ENCOUNTER — Other Ambulatory Visit: Payer: Self-pay | Admitting: Family Medicine

## 2012-09-14 DIAGNOSIS — E041 Nontoxic single thyroid nodule: Secondary | ICD-10-CM

## 2012-09-18 ENCOUNTER — Other Ambulatory Visit: Payer: Self-pay | Admitting: Family Medicine

## 2012-09-18 DIAGNOSIS — E041 Nontoxic single thyroid nodule: Secondary | ICD-10-CM

## 2012-09-20 ENCOUNTER — Ambulatory Visit
Admission: RE | Admit: 2012-09-20 | Discharge: 2012-09-20 | Disposition: A | Discharge status: Home / Self Care | Payer: PRIVATE HEALTH INSURANCE | Source: Ambulatory Visit | Attending: Family Medicine | Admitting: Family Medicine

## 2012-09-20 ENCOUNTER — Other Ambulatory Visit (HOSPITAL_COMMUNITY)
Admission: RE | Admit: 2012-09-20 | Discharge: 2012-09-20 | Disposition: A | Discharge status: Home / Self Care | Payer: PRIVATE HEALTH INSURANCE | Source: Ambulatory Visit | Attending: Diagnostic Radiology | Admitting: Diagnostic Radiology

## 2012-09-20 DIAGNOSIS — E041 Nontoxic single thyroid nodule: Secondary | ICD-10-CM

## 2012-09-20 DIAGNOSIS — E049 Nontoxic goiter, unspecified: Secondary | ICD-10-CM | POA: Insufficient documentation

## 2014-10-28 DIAGNOSIS — F3342 Major depressive disorder, recurrent, in full remission: Secondary | ICD-10-CM | POA: Insufficient documentation

## 2015-10-31 ENCOUNTER — Other Ambulatory Visit: Payer: Self-pay | Admitting: Family Medicine

## 2015-10-31 DIAGNOSIS — E2839 Other primary ovarian failure: Secondary | ICD-10-CM

## 2015-10-31 DIAGNOSIS — Z1231 Encounter for screening mammogram for malignant neoplasm of breast: Secondary | ICD-10-CM

## 2015-11-11 ENCOUNTER — Encounter: Payer: Self-pay | Admitting: Internal Medicine

## 2015-11-24 ENCOUNTER — Ambulatory Visit
Admission: RE | Admit: 2015-11-24 | Discharge: 2015-11-24 | Disposition: A | Discharge status: Home / Self Care | Payer: Medicare Other | Source: Ambulatory Visit | Attending: Family Medicine | Admitting: Family Medicine

## 2015-11-24 DIAGNOSIS — Z1231 Encounter for screening mammogram for malignant neoplasm of breast: Secondary | ICD-10-CM

## 2015-11-24 DIAGNOSIS — E2839 Other primary ovarian failure: Secondary | ICD-10-CM

## 2016-01-14 ENCOUNTER — Other Ambulatory Visit: Payer: Self-pay | Admitting: Gastroenterology

## 2016-03-30 ENCOUNTER — Ambulatory Visit (INDEPENDENT_AMBULATORY_CARE_PROVIDER_SITE_OTHER): Payer: Medicare Other

## 2016-03-30 ENCOUNTER — Ambulatory Visit (INDEPENDENT_AMBULATORY_CARE_PROVIDER_SITE_OTHER): Payer: Medicare Other | Admitting: Sports Medicine

## 2016-03-30 ENCOUNTER — Encounter: Payer: Self-pay | Admitting: Sports Medicine

## 2016-03-30 DIAGNOSIS — M79673 Pain in unspecified foot: Secondary | ICD-10-CM

## 2016-03-30 DIAGNOSIS — M19079 Primary osteoarthritis, unspecified ankle and foot: Secondary | ICD-10-CM

## 2016-03-30 DIAGNOSIS — E1142 Type 2 diabetes mellitus with diabetic polyneuropathy: Secondary | ICD-10-CM

## 2016-03-30 DIAGNOSIS — M722 Plantar fascial fibromatosis: Secondary | ICD-10-CM | POA: Diagnosis not present

## 2016-03-30 DIAGNOSIS — L603 Nail dystrophy: Secondary | ICD-10-CM

## 2016-03-30 MED ORDER — DICLOFENAC SODIUM 75 MG PO TBEC
75.0000 mg | DELAYED_RELEASE_TABLET | Freq: Two times a day (BID) | ORAL | Status: DC
Start: 2016-03-30 — End: 2016-05-03

## 2016-03-30 NOTE — Progress Notes (Signed)
Patient ID: Deborah Weeks, female   DOB: [birth year], 70 y.o.   MRN: [id number] Subjective: Deborah Weeks is a 70 y.o. female patient seen today in office with complaint of 1) painful thickened and discolored nails worse at big toenails that sometime ingrow. Patient is desiring treatment for nail changes; Reports that nails are becoming difficult to manage because of the thickness. 2) Pain ocassional in nature at tops and bottoms of both feet that is worse at the end of the day or after use at night. Tylenol arthritis medication helps 3) Burning sensation to toes that is becoming more bothersome. Patient has no other pedal complaints at this time.   Patient Active Problem List   Diagnosis Date Noted  . Recurrent major depressive disorder, in full remission ([place]) 10/28/2014  . Multinodular goiter 08/30/2012  . Allergic rhinitis 05/30/2012  . Avitaminosis D 04/22/2011  . Type 2 diabetes mellitus ([place]) 01/05/2011  . Controlled type 2 diabetes mellitus without complication ([place]) [id number]  . BP (high blood pressure) 01/04/2011  . Osteopenia 12/07/2010  . Glaucoma 02/27/2010  . 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 Systemic lupus erythematosus ([place]) 02/27/2010    No current outpatient prescriptions on file prior to visit.   No current facility-administered medications on file prior to visit.    Allergies  Allergen Reactions  . Escitalopram Oxalate Other (See Comments)    Insomnia  . Codeine Nausea And Vomiting  . Niacin And Related Itching and Rash    Objective: Physical Exam  General: Well developed, nourished, no acute distress, awake, alert and oriented x 3  Vascular: Dorsalis pedis artery 2/4 bilateral, Posterior tibial artery 1/4 bilateral, skin temperature warm to warm proximal to distal bilateral lower extremities, no varicosities, pedal hair present bilateral.  Neurological: Gross sensation present via light touch bilateral. Protective intact bilateral. Vibratory  diminished bilateral. Subjective burning to toes bilateral.   Dermatological: Skin is warm, dry, and supple bilateral, Nails 1-10 are tender, short thick, and discolored with mild subungal debris with most involved nails bilateral hallux with no acute ingrowing noted, no webspace macerations present bilateral, no open lesions present bilateral, no callus/corns/hyperkeratotic tissue present bilateral. No signs of infection bilateral.  Musculoskeletal: No symptomatic boney deformities noted bilateral. No reproducible tenderness to both feet or plantar fascia. Muscular strength within normal limits without pain on range of motion. No pain with calf compression bilateral.  Xray: Left and right foot, Significant calcaneal spurs, joint space narrowing suggestive of arthritis. No other acute findings.   Assessment and Plan:  Problem List Items Addressed This Visit    None    Visit Diagnoses    Foot pain, unspecified laterality    -  Primary    Relevant Medications    diclofenac (VOLTAREN) 75 MG EC tablet    Other Relevant Orders    DG Foot 2 Views Left    DG Foot 2 Views Right    Osteoarthritis of ankle and foot, unspecified laterality        Relevant Medications    aspirin 81 MG tablet    diclofenac (VOLTAREN) 75 MG EC tablet    Nail dystrophy        Relevant Orders    Fungus Culture with Smear    Plantar fasciitis, bilateral        Diabetic polyneuropathy associated with type 2 diabetes mellitus (HCC)        Relevant Medications    amLODipine-olmesartan (AZOR) 10-40 MG tablet    aspirin 81 MG  tablet    atorvastatin (LIPITOR) 20 MG tablet    metFORMIN (GLUCOPHAGE) 1000 MG tablet    metFORMIN (GLUCOPHAGE) 1000 MG tablet       -Examined patient -Xrays reviewed -Rx Diclofenac for pain and arthritis in both feet. If worsens will further look at systemic conditions like Diabetes and Lupus to determine how these are contributing to foot symptoms -Recommend daily stretching for foot pain  and possible plantar fasciitis -Recommend good supportive shoes, daily inspection, and to refrain from walking barefoot -Recommend icing and epsom salt soaks as instructed  -Discussed treatment options for painful dystrophic nails  -Fungal culture was obtained from bilateral hallux nails and sent to [facility] lab -Patient to return in 4 weeks for follow up evaluation and discussion of fungal culture results or sooner if symptoms worsen.  Landis Martins, DPM

## 2016-04-27 ENCOUNTER — Ambulatory Visit: Payer: Medicare Other | Admitting: Sports Medicine

## 2016-05-03 ENCOUNTER — Encounter: Payer: Self-pay | Admitting: Sports Medicine

## 2016-05-03 ENCOUNTER — Ambulatory Visit (INDEPENDENT_AMBULATORY_CARE_PROVIDER_SITE_OTHER): Payer: Medicare Other | Admitting: Sports Medicine

## 2016-05-03 DIAGNOSIS — E1142 Type 2 diabetes mellitus with diabetic polyneuropathy: Secondary | ICD-10-CM

## 2016-05-03 DIAGNOSIS — M19079 Primary osteoarthritis, unspecified ankle and foot: Secondary | ICD-10-CM

## 2016-05-03 DIAGNOSIS — M79673 Pain in unspecified foot: Secondary | ICD-10-CM

## 2016-05-03 DIAGNOSIS — B351 Tinea unguium: Secondary | ICD-10-CM | POA: Diagnosis not present

## 2016-05-03 MED ORDER — NONFORMULARY OR COMPOUNDED ITEM: 11 refills | Status: DC

## 2016-05-03 MED ORDER — DICLOFENAC SODIUM 75 MG PO TBEC: 75.0000 mg | DELAYED_RELEASE_TABLET | Freq: Two times a day (BID) | ORAL | 1 refills | Status: DC

## 2016-05-03 NOTE — Progress Notes (Signed)
Patient ID: Deborah Weeks, female   DOB: 1946-07-22, 70 y.o.   MRN: [id number] Subjective: Deborah Weeks is a 70 y.o. female patient seen today in office 1) Fungal culture results 2) Pain ocassional in nature at tops and bottoms of both feet that is better with Diclofenac desires refill and 3)Continued Burning sensation to toes. Patient has no other pedal complaints at this time.   Patient Active Problem List   Diagnosis Date Noted  . Recurrent major depressive disorder, in full remission ([place]) 10/28/2014  . Multinodular goiter 08/30/2012  . Allergic rhinitis 05/30/2012  . Avitaminosis D 04/22/2011  . Type 2 diabetes mellitus ([place]) 01/05/2011  . Controlled type 2 diabetes mellitus without complication ([place]) 93/73/4287  . BP (high blood pressure) 01/04/2011  . Osteopenia 12/07/2010  . Glaucoma 02/27/2010  . 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 Systemic lupus erythematosus ([place]) 02/27/2010    Current Outpatient Prescriptions on File Prior to Visit  Medication Sig Dispense Refill  . amLODipine-olmesartan (AZOR) 10-40 MG tablet Take 1 tablet by mouth daily.  3  . aspirin 81 MG tablet Take by mouth.    Marland Kitchen atorvastatin (LIPITOR) 20 MG tablet     . Blood Glucose Calibration (OT ULTRA/FASTTK CNTRL SOLN) SOLN     . Blood Glucose Monitoring Suppl (ACCU-CHEK AVIVA PLUS) w/Device KIT     . carvedilol (COREG) 25 MG tablet     . glucose blood (FREESTYLE LITE) test strip     . Lancets (FREESTYLE) lancets     . metFORMIN (GLUCOPHAGE) 1000 MG tablet     . metFORMIN (GLUCOPHAGE) 1000 MG tablet TAKE 1 TABLET (1,000 MG TOTAL) BY MOUTH 2 (TWO) TIMES DAILY WITH MEALS.  3  . Multiple Vitamin (MULTIVITAMIN) capsule Take by mouth.    . polyethylene glycol-electrolytes (NULYTELY/GOLYTELY) 420 g solution See admin instructions.  0  . Travoprost, BAK Free, (TRAVATAN Z) 0.004 % SOLN ophthalmic solution      No current facility-administered medications on file prior to visit.     Allergies  Allergen  Reactions  . Escitalopram Oxalate Other (See Comments)    Insomnia  . Codeine Nausea And Vomiting  . Niacin And Related Itching and Rash    Objective: Physical Exam  General: Well developed, nourished, no acute distress, awake, alert and oriented x 3  Vascular: Dorsalis pedis artery 2/4 bilateral, Posterior tibial artery 1/4 bilateral, skin temperature warm to warm proximal to distal bilateral lower extremities, no varicosities, pedal hair present bilateral.  Neurological: Gross sensation present via light touch bilateral. Protective intact bilateral. Vibratory diminished bilateral. Subjective burning to toes bilateral.   Dermatological: Skin is warm, dry, and supple bilateral, Nails 1-10 are tender, short thick, and discolored with mild subungal debris with most involved nails bilateral hallux with no acute ingrowing noted, no webspace macerations present bilateral, no open lesions present bilateral, no callus/corns/hyperkeratotic tissue present bilateral. No signs of infection bilateral.  Musculoskeletal: No symptomatic boney deformities noted bilateral. No reproducible tenderness to both feet or plantar fascia. Muscular strength within normal limits without pain on range of motion. No pain with calf compression bilateral.   Assessment and Plan:  Problem List Items Addressed This Visit    None    Visit Diagnoses    Nail fungus    -  Primary   Osteoarthritis of ankle and foot, unspecified laterality       Relevant Medications   diclofenac (VOLTAREN) 75 MG EC tablet   Foot pain, unspecified laterality  Relevant Medications   diclofenac (VOLTAREN) 75 MG EC tablet   Diabetic polyneuropathy associated with type 2 diabetes mellitus ([place])          -Examined patient -Discussed + Fungal culture results T. Rubrum and Saprophytic mold -Patient opt for topical and would like to also try laser once she has enough $ to do so -Rx topical onychomycosis nail lacquer from  [facility] -Refilled Diclofenac for pain and arthritis in both feet. If worsens will further look at systemic conditions like Diabetes and Lupus to determine how these are contributing to foot symptoms -Recommend daily stretching for foot pain -Recommend good supportive shoes, daily inspection, and to refrain from walking barefoot -Recommend  Continue icing and epsom salt soaks as instructed  -Will consider topical capsciacin cream if neuropathic pain continues because has had several other neuropathic meds with adverse reactions -Patient to return in 12 weeks for follow up evaluation or sooner if symptoms worsen.  Deborah Weeks, DPM

## 2016-05-04 ENCOUNTER — Other Ambulatory Visit: Payer: Self-pay | Admitting: Sports Medicine

## 2016-05-04 DIAGNOSIS — M79673 Pain in unspecified foot: Secondary | ICD-10-CM

## 2016-05-04 DIAGNOSIS — M19079 Primary osteoarthritis, unspecified ankle and foot: Secondary | ICD-10-CM

## 2016-05-07 ENCOUNTER — Encounter: Payer: Self-pay | Admitting: Sports Medicine

## 2016-08-03 ENCOUNTER — Ambulatory Visit: Payer: Medicare Other | Admitting: Sports Medicine

## 2016-08-10 ENCOUNTER — Ambulatory Visit (INDEPENDENT_AMBULATORY_CARE_PROVIDER_SITE_OTHER): Payer: Medicare Other | Admitting: Sports Medicine

## 2016-08-10 ENCOUNTER — Encounter: Payer: Self-pay | Admitting: Sports Medicine

## 2016-08-10 DIAGNOSIS — M79672 Pain in left foot: Secondary | ICD-10-CM

## 2016-08-10 DIAGNOSIS — E1142 Type 2 diabetes mellitus with diabetic polyneuropathy: Secondary | ICD-10-CM | POA: Diagnosis not present

## 2016-08-10 DIAGNOSIS — B351 Tinea unguium: Secondary | ICD-10-CM | POA: Diagnosis not present

## 2016-08-10 DIAGNOSIS — M19079 Primary osteoarthritis, unspecified ankle and foot: Secondary | ICD-10-CM

## 2016-08-10 DIAGNOSIS — M79671 Pain in right foot: Secondary | ICD-10-CM | POA: Diagnosis not present

## 2016-08-10 NOTE — Progress Notes (Signed)
Patient ID: Deborah Weeks, female   DOB: [birth year], 70 y.o.   MRN: [id number] Subjective: Deborah Weeks is a 70 y.o. female patient seen today in office fungal nail check and for diabetic nail care; Reports that she has not been using anything on her nails. Could not afford topical and would like to save for laser. Patient has no other pedal complaints at this time.   Patient Active Problem List   Diagnosis Date Noted  . Recurrent major depressive disorder, in full remission ([place]) 10/28/2014  . Multinodular goiter 08/30/2012  . Allergic rhinitis 05/30/2012  . Avitaminosis D 04/22/2011  . Type 2 diabetes mellitus ([place]) 01/05/2011  . Controlled type 2 diabetes mellitus without complication ([place]) 76/73/4193  . BP (high blood pressure) 01/04/2011  . Osteopenia 12/07/2010  . Glaucoma 02/27/2010  . 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 Systemic lupus erythematosus ([place]) 02/27/2010    Current Outpatient Prescriptions on File Prior to Visit  Medication Sig Dispense Refill  . amLODipine-olmesartan (AZOR) 10-40 MG tablet Take 1 tablet by mouth daily.  3  . aspirin 81 MG tablet Take by mouth.    Marland Kitchen atorvastatin (LIPITOR) 20 MG tablet     . Blood Glucose Calibration (OT ULTRA/FASTTK CNTRL SOLN) SOLN     . Blood Glucose Monitoring Suppl (ACCU-CHEK AVIVA PLUS) w/Device KIT     . carvedilol (COREG) 25 MG tablet     . diclofenac (VOLTAREN) 75 MG EC tablet Take 1 tablet (75 mg total) by mouth 2 (two) times daily. 30 tablet 1  . diclofenac (VOLTAREN) 75 MG EC tablet TAKE 1 TABLET (75 MG TOTAL) BY MOUTH 2 (TWO) TIMES DAILY. 30 tablet 1  . glucose blood (FREESTYLE LITE) test strip     . Lancets (FREESTYLE) lancets     . metFORMIN (GLUCOPHAGE) 1000 MG tablet     . metFORMIN (GLUCOPHAGE) 1000 MG tablet TAKE 1 TABLET (1,000 MG TOTAL) BY MOUTH 2 (TWO) TIMES DAILY WITH MEALS.  3  . Multiple Vitamin (MULTIVITAMIN) capsule Take by mouth.    . NONFORMULARY OR COMPOUNDED [place] compound:   Fluconazole 2%, Terbinafine 1%, DMSO, apply daily to affected area. 120 each 11  . polyethylene glycol-electrolytes (NULYTELY/GOLYTELY) 420 g solution See admin instructions.  0  . Travoprost, BAK Free, (TRAVATAN Z) 0.004 % SOLN ophthalmic solution      No current facility-administered medications on file prior to visit.     Allergies  Allergen Reactions  . Escitalopram Oxalate Other (See Comments)    Insomnia  . Codeine Nausea And Vomiting  . Niacin And Related Itching and Rash    Objective: Physical Exam  General: Well developed, nourished, no acute distress, awake, alert and oriented x 3  Vascular: Dorsalis pedis artery 2/4 bilateral, Posterior tibial artery 1/4 bilateral, skin temperature warm to warm proximal to distal bilateral lower extremities, no varicosities, pedal hair present bilateral.  Neurological: Gross sensation present via light touch bilateral. Protective intact bilateral. Vibratory diminished bilateral. Subjective burning to toes bilateral.   Dermatological: Skin is warm, dry, and supple bilateral, Nails 1-10 are tender, elongated thick, and discolored with mild subungal debris with most involved nails bilateral hallux with no acute ingrowing noted, no webspace macerations present bilateral, no open lesions present bilateral, no callus/corns/hyperkeratotic tissue present bilateral. No signs of infection bilateral.  Musculoskeletal: No symptomatic boney deformities noted bilateral. No reproducible tenderness to both feet or plantar fascia. Muscular strength within normal limits without pain on range of motion. No pain with  calf compression bilateral.   Assessment and Plan:  Problem List Items Addressed This Visit    None    Visit Diagnoses    Nail fungus    -  Primary   Diabetic polyneuropathy associated with type 2 diabetes mellitus (HCC)       Osteoarthritis of ankle and foot, unspecified laterality       Foot pain, bilateral          -Examined  patient -Mechanically trimmed nails x 10 using sterile nail nipper -Patient will save for laser for nails -Recommend good hygiene habits -Continue Lyrica as rx by PCP -Patient to return in 12 weeks for follow up evaluation/Diabetic nail care or sooner if symptoms worsen.  Deborah Weeks, DPM

## 2016-09-18 ENCOUNTER — Other Ambulatory Visit: Payer: Self-pay | Admitting: Sports Medicine

## 2016-09-18 DIAGNOSIS — M19079 Primary osteoarthritis, unspecified ankle and foot: Secondary | ICD-10-CM

## 2016-09-18 DIAGNOSIS — M79673 Pain in unspecified foot: Secondary | ICD-10-CM

## 2016-11-09 ENCOUNTER — Encounter: Payer: Self-pay | Admitting: Sports Medicine

## 2016-11-09 ENCOUNTER — Ambulatory Visit (INDEPENDENT_AMBULATORY_CARE_PROVIDER_SITE_OTHER): Payer: Medicare Other | Admitting: Sports Medicine

## 2016-11-09 DIAGNOSIS — M79671 Pain in right foot: Secondary | ICD-10-CM

## 2016-11-09 DIAGNOSIS — M19079 Primary osteoarthritis, unspecified ankle and foot: Secondary | ICD-10-CM

## 2016-11-09 DIAGNOSIS — E1142 Type 2 diabetes mellitus with diabetic polyneuropathy: Secondary | ICD-10-CM | POA: Diagnosis not present

## 2016-11-09 DIAGNOSIS — M79672 Pain in left foot: Secondary | ICD-10-CM | POA: Diagnosis not present

## 2016-11-09 DIAGNOSIS — B351 Tinea unguium: Secondary | ICD-10-CM

## 2016-11-09 NOTE — Progress Notes (Signed)
Patient ID: Deborah Weeks, female   DOB: 1946-09-06, 71 y.o.   MRN: [id number] Subjective: Deborah Weeks is a 71 y.o. female patient seen today in office for diabetic foot check, states that she had her nails trimmed last week at nail salon could not wait until this week. Patient has no other pedal complaints at this time.  FBS good   Patient Active Problem List   Diagnosis Date Noted  . Recurrent major depressive disorder, in full remission ([place]) 10/28/2014  . Multinodular goiter 08/30/2012  . Allergic rhinitis 05/30/2012  . Avitaminosis D 04/22/2011  . Type 2 diabetes mellitus ([place]) 01/05/2011  . Controlled type 2 diabetes mellitus without complication ([place]) 67/59/1638  . BP (high blood pressure) 01/04/2011  . Osteopenia 12/07/2010  . Glaucoma 02/27/2010  . 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 Systemic lupus erythematosus ([place]) 02/27/2010    Current Outpatient Prescriptions on File Prior to Visit  Medication Sig Dispense Refill  . amLODipine-olmesartan (AZOR) 10-40 MG tablet Take 1 tablet by mouth daily.  3  . aspirin 81 MG tablet Take by mouth.    Marland Kitchen atorvastatin (LIPITOR) 20 MG tablet     . Blood Glucose Calibration (OT ULTRA/FASTTK CNTRL SOLN) SOLN     . Blood Glucose Monitoring Suppl (ACCU-CHEK AVIVA PLUS) w/Device KIT     . carvedilol (COREG) 25 MG tablet     . diclofenac (VOLTAREN) 75 MG EC tablet Take 1 tablet (75 mg total) by mouth 2 (two) times daily. 30 tablet 1  . diclofenac (VOLTAREN) 75 MG EC tablet TAKE 1 TABLET (75 MG TOTAL) BY MOUTH 2 (TWO) TIMES DAILY. 30 tablet 1  . glucose blood (FREESTYLE LITE) test strip     . Lancets (FREESTYLE) lancets     . metFORMIN (GLUCOPHAGE) 1000 MG tablet     . metFORMIN (GLUCOPHAGE) 1000 MG tablet TAKE 1 TABLET (1,000 MG TOTAL) BY MOUTH 2 (TWO) TIMES DAILY WITH MEALS.  3  . Multiple Vitamin (MULTIVITAMIN) capsule Take by mouth.    . NONFORMULARY OR COMPOUNDED [place] compound:  Fluconazole 2%, Terbinafine 1%,  DMSO, apply daily to affected area. 120 each 11  . polyethylene glycol-electrolytes (NULYTELY/GOLYTELY) 420 g solution See admin instructions.  0  . Travoprost, BAK Free, (TRAVATAN Z) 0.004 % SOLN ophthalmic solution      No current facility-administered medications on file prior to visit.     Allergies  Allergen Reactions  . Escitalopram Oxalate Other (See Comments)    Insomnia  . Codeine Nausea And Vomiting  . Niacin And Related Itching and Rash    Objective: Physical Exam  General: Well developed, nourished, no acute distress, awake, alert and oriented x 3  Vascular: Dorsalis pedis artery 2/4 bilateral, Posterior tibial artery 1/4 bilateral, skin temperature warm to warm proximal to distal bilateral lower extremities, no varicosities, pedal hair present bilateral.  Neurological: Gross sensation present via light touch bilateral. Protective intact bilateral. Vibratory diminished bilateral. Subjective burning to toes bilateral.   Dermatological: Skin is warm, dry, and supple bilateral, Nails 1-10 are short thick, and discolored with mild subungal debris with most involved nails bilateral hallux with no acute ingrowing noted, trimmed last week, no webspace macerations present bilateral, no open lesions present bilateral, no callus/corns/hyperkeratotic tissue present bilateral. No signs of infection bilateral.  Musculoskeletal: No symptomatic boney deformities noted bilateral. No reproducible tenderness to both feet or plantar fascia. Muscular strength within normal limits without pain on range of motion. No pain with calf compression bilateral.  Assessment and Plan:  Problem List Items Addressed This Visit    None    Visit Diagnoses    Nail fungus    -  Primary   Diabetic polyneuropathy associated with type 2 diabetes mellitus (HCC)       Osteoarthritis of ankle and foot, unspecified laterality       Foot pain, bilateral         -Examined patient -Nails were trimmed at  salon/spa last week -Patient will save for laser for nails -Recommend good hygiene habits -Continue Lyrica as rx by PCP -Patient to return in  62 ddaysfor follow up evaluation/Diabetic nail care or sooner if symptoms worsen.  Landis Martins, DPM

## 2016-12-02 ENCOUNTER — Other Ambulatory Visit: Payer: Self-pay | Admitting: Family Medicine

## 2016-12-02 DIAGNOSIS — Z1231 Encounter for screening mammogram for malignant neoplasm of breast: Secondary | ICD-10-CM

## 2017-01-11 ENCOUNTER — Ambulatory Visit (INDEPENDENT_AMBULATORY_CARE_PROVIDER_SITE_OTHER): Payer: Medicare Other | Admitting: Sports Medicine

## 2017-01-11 ENCOUNTER — Encounter: Payer: Self-pay | Admitting: Sports Medicine

## 2017-01-11 DIAGNOSIS — M79671 Pain in right foot: Secondary | ICD-10-CM | POA: Diagnosis not present

## 2017-01-11 DIAGNOSIS — M19079 Primary osteoarthritis, unspecified ankle and foot: Secondary | ICD-10-CM

## 2017-01-11 DIAGNOSIS — M79672 Pain in left foot: Secondary | ICD-10-CM

## 2017-01-11 DIAGNOSIS — B351 Tinea unguium: Secondary | ICD-10-CM | POA: Diagnosis not present

## 2017-01-11 DIAGNOSIS — E1142 Type 2 diabetes mellitus with diabetic polyneuropathy: Secondary | ICD-10-CM | POA: Diagnosis not present

## 2017-01-11 MED ORDER — DICLOFENAC SODIUM 75 MG PO TBEC: 75.0000 mg | DELAYED_RELEASE_TABLET | Freq: Two times a day (BID) | ORAL | 5 refills | Status: DC

## 2017-01-11 NOTE — Progress Notes (Signed)
Patient ID: Deborah Weeks, female   DOB: 02-22-1946, 71 y.o.   MRN: [id number] Subjective: Deborah Weeks is a 71 y.o. female patient seen today in office for diabetic foot check and nail trim, states that she had her nails trimmed 2 weeks ago at nail salon could not wait. Patient has no other pedal complaints at this time.  FBS good   Patient Active Problem List   Diagnosis Date Noted  . Recurrent major depressive disorder, in full remission ([place]) 10/28/2014  . Multinodular goiter 08/30/2012  . Allergic rhinitis 05/30/2012  . Avitaminosis D 04/22/2011  . Type 2 diabetes mellitus ([place]) 01/05/2011  . Controlled type 2 diabetes mellitus without complication ([place]) 98/92/1194  . BP (high blood pressure) 01/04/2011  . Osteopenia 12/07/2010  . Glaucoma 02/27/2010  . 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 Systemic lupus erythematosus ([place]) 02/27/2010    Current Outpatient Prescriptions on File Prior to Visit  Medication Sig Dispense Refill  . amLODipine-olmesartan (AZOR) 10-40 MG tablet Take 1 tablet by mouth daily.  3  . aspirin 81 MG tablet Take by mouth.    Marland Kitchen atorvastatin (LIPITOR) 20 MG tablet     . Blood Glucose Calibration (OT ULTRA/FASTTK CNTRL SOLN) SOLN     . Blood Glucose Monitoring Suppl (ACCU-CHEK AVIVA PLUS) w/Device KIT     . carvedilol (COREG) 25 MG tablet     . glucose blood (FREESTYLE LITE) test strip     . Lancets (FREESTYLE) lancets     . metFORMIN (GLUCOPHAGE) 1000 MG tablet     . Multiple Vitamin (MULTIVITAMIN) capsule Take by mouth.    . NONFORMULARY OR COMPOUNDED [place] compound:  Fluconazole 2%, Terbinafine 1%, DMSO, apply daily to affected area. 120 each 11  . polyethylene glycol-electrolytes (NULYTELY/GOLYTELY) 420 g solution See admin instructions.  0  . Travoprost, BAK Free, (TRAVATAN Z) 0.004 % SOLN ophthalmic solution      No current facility-administered medications on file prior to visit.     Allergies  Allergen Reactions  .  Escitalopram Oxalate Other (See Comments)    Insomnia  . Codeine Nausea And Vomiting  . Niacin And Related Itching and Rash    Objective: Physical Exam  General: Well developed, nourished, no acute distress, awake, alert and oriented x 3  Vascular: Dorsalis pedis artery 2/4 bilateral, Posterior tibial artery 1/4 bilateral, skin temperature warm to warm proximal to distal bilateral lower extremities, no varicosities, pedal hair present bilateral.  Neurological: Gross sensation present via light touch bilateral. Protective intact bilateral. Vibratory diminished bilateral. Subjective burning to toes bilateral.   Dermatological: Skin is warm, dry, and supple bilateral, Nails 1-10 are mildly elonagted thick, and discolored with mild subungal debris with most involved nails bilateral hallux with no acute ingrowing noted, trimmed last week, no webspace macerations present bilateral, no open lesions present bilateral, no callus/corns/hyperkeratotic tissue present bilateral. No signs of infection bilateral.  Musculoskeletal: No symptomatic boney deformities noted bilateral. No reproducible tenderness to both feet or plantar fascia. History of arthritis. Muscular strength within normal limits without pain on range of motion. No pain with calf compression bilateral.   Assessment and Plan:  Problem List Items Addressed This Visit    None    Visit Diagnoses    Nail fungus    -  Primary   Diabetic polyneuropathy associated with type 2 diabetes mellitus (HCC)       Foot pain, bilateral       Osteoarthritis of ankle and foot, unspecified  laterality       Relevant Medications   diclofenac (VOLTAREN) 75 MG EC tablet     -Examined patient -Nails x10  Debrided with sterile nail nipper without incident  -Patient will save for laser for nails -Recommend good hygiene habits -Continue Lyrica as rx by PCP -Rx Diclofenac for pain and arthritis  -Patient to return in  62 daysfor follow up  evaluation/Diabetic nail care or sooner if symptoms worsen.  Landis Martins, DPM

## 2017-02-24 ENCOUNTER — Ambulatory Visit
Admission: RE | Admit: 2017-02-24 | Discharge: 2017-02-24 | Disposition: A | Discharge status: Home / Self Care | Payer: Medicare Other | Source: Ambulatory Visit | Attending: Family Medicine | Admitting: Family Medicine

## 2017-02-24 DIAGNOSIS — Z1231 Encounter for screening mammogram for malignant neoplasm of breast: Secondary | ICD-10-CM

## 2017-04-12 ENCOUNTER — Encounter: Payer: Self-pay | Admitting: Sports Medicine

## 2017-04-12 ENCOUNTER — Ambulatory Visit (INDEPENDENT_AMBULATORY_CARE_PROVIDER_SITE_OTHER): Payer: Medicare Other | Admitting: Sports Medicine

## 2017-04-12 DIAGNOSIS — M79674 Pain in right toe(s): Secondary | ICD-10-CM

## 2017-04-12 DIAGNOSIS — B351 Tinea unguium: Secondary | ICD-10-CM

## 2017-04-12 DIAGNOSIS — E1142 Type 2 diabetes mellitus with diabetic polyneuropathy: Secondary | ICD-10-CM | POA: Diagnosis not present

## 2017-04-12 DIAGNOSIS — M79675 Pain in left toe(s): Secondary | ICD-10-CM

## 2017-04-12 NOTE — Progress Notes (Signed)
Patient ID: Elleanor Guyett, female   DOB: March 26, 1946, 71 y.o.   MRN: [id number] Subjective: Keyira Mondesir is a 71 y.o. female patient seen today in office for diabetic foot check and nail trim, states that she had no changes with medication. Patient has no other pedal complaints at this time.  FBS good   Patient Active Problem List   Diagnosis Date Noted  . Recurrent major depressive disorder, in full remission ([place]) 10/28/2014  . Multinodular goiter 08/30/2012  . Allergic rhinitis 05/30/2012  . Avitaminosis D 04/22/2011  . Type 2 diabetes mellitus ([place]) 01/05/2011  . Controlled type 2 diabetes mellitus without complication ([place]) 60/07/9322  . BP (high blood pressure) 01/04/2011  . Osteopenia 12/07/2010  . Glaucoma 02/27/2010  . 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 Systemic lupus erythematosus ([place]) 02/27/2010    Current Outpatient Prescriptions on File Prior to Visit  Medication Sig Dispense Refill  . amLODipine-olmesartan (AZOR) 10-40 MG tablet Take 1 tablet by mouth daily.  3  . aspirin 81 MG tablet Take by mouth.    Marland Kitchen atorvastatin (LIPITOR) 20 MG tablet     . Blood Glucose Calibration (OT ULTRA/FASTTK CNTRL SOLN) SOLN     . Blood Glucose Monitoring Suppl (ACCU-CHEK AVIVA PLUS) w/Device KIT     . carvedilol (COREG) 25 MG tablet     . diclofenac (VOLTAREN) 75 MG EC tablet Take 1 tablet (75 mg total) by mouth 2 (two) times daily. 90 tablet 5  . glucose blood (FREESTYLE LITE) test strip     . Lancets (FREESTYLE) lancets     . metFORMIN (GLUCOPHAGE) 1000 MG tablet     . Multiple Vitamin (MULTIVITAMIN) capsule Take by mouth.    . NONFORMULARY OR COMPOUNDED [place] compound:  Fluconazole 2%, Terbinafine 1%, DMSO, apply daily to affected area. 120 each 11  . polyethylene glycol-electrolytes (NULYTELY/GOLYTELY) 420 g solution See admin instructions.  0  . Travoprost, BAK Free, (TRAVATAN Z) 0.004 % SOLN ophthalmic solution      No current facility-administered  medications on file prior to visit.     Allergies  Allergen Reactions  . Escitalopram Oxalate Other (See Comments)    Insomnia  . Codeine Nausea And Vomiting  . Niacin And Related Itching and Rash    Objective: Physical Exam  General: Well developed, nourished, no acute distress, awake, alert and oriented x 3  Vascular: Dorsalis pedis artery 2/4 bilateral, Posterior tibial artery 1/4 bilateral, skin temperature warm to warm proximal to distal bilateral lower extremities, no varicosities, pedal hair present bilateral.  Neurological: Gross sensation present via light touch bilateral. Protective intact bilateral. Vibratory diminished bilateral. Subjective burning to toes bilateral.   Dermatological: Skin is warm, dry, and supple bilateral, Nails 1-10 are elonagted thick, and discolored with mild subungal debris with most involved nails bilateral hallux with no acute ingrowing noted, no webspace macerations present bilateral, no open lesions present bilateral, no callus/corns/hyperkeratotic tissue present bilateral. No signs of infection bilateral.  Musculoskeletal: No symptomatic boney deformities noted bilateral. No reproducible tenderness to both feet or plantar fascia. History of arthritis. Muscular strength within normal limits without pain on range of motion. No pain with calf compression bilateral.   Assessment and Plan:  Problem List Items Addressed This Visit    None    Visit Diagnoses    Pain due to onychomycosis of toenails of both feet    -  Primary   Diabetic polyneuropathy associated with type 2 diabetes mellitus ([place])         -  Examined patient -Nails x10  Debrided with sterile nail nipper without incident  -Recommend good hygiene habits -Continue Lyrica as rx by PCP -Continue with PRN Diclofenac for pain and arthritis  -Patient to return in  2.5 to 3 months for follow up evaluation/Diabetic nail care or sooner if symptoms worsen.  Landis Martins, DPM

## 2017-06-21 ENCOUNTER — Other Ambulatory Visit: Payer: Self-pay | Admitting: Sports Medicine

## 2017-06-21 ENCOUNTER — Encounter: Payer: Self-pay | Admitting: Sports Medicine

## 2017-06-21 ENCOUNTER — Ambulatory Visit (INDEPENDENT_AMBULATORY_CARE_PROVIDER_SITE_OTHER): Payer: Medicare Other

## 2017-06-21 ENCOUNTER — Ambulatory Visit (INDEPENDENT_AMBULATORY_CARE_PROVIDER_SITE_OTHER): Payer: Medicare Other | Admitting: Sports Medicine

## 2017-06-21 DIAGNOSIS — M79672 Pain in left foot: Secondary | ICD-10-CM

## 2017-06-21 DIAGNOSIS — M722 Plantar fascial fibromatosis: Secondary | ICD-10-CM

## 2017-06-21 DIAGNOSIS — M19079 Primary osteoarthritis, unspecified ankle and foot: Secondary | ICD-10-CM

## 2017-06-21 DIAGNOSIS — M19072 Primary osteoarthritis, left ankle and foot: Secondary | ICD-10-CM

## 2017-06-21 DIAGNOSIS — E1142 Type 2 diabetes mellitus with diabetic polyneuropathy: Secondary | ICD-10-CM

## 2017-06-21 MED ORDER — DICLOFENAC SODIUM 75 MG PO TBEC: 75.0000 mg | DELAYED_RELEASE_TABLET | Freq: Two times a day (BID) | ORAL | 5 refills | Status: DC

## 2017-06-21 MED ORDER — TRIAMCINOLONE ACETONIDE 10 MG/ML IJ SUSP: 10.0000 mg | Freq: Once | INTRAMUSCULAR | Status: AC

## 2017-06-21 NOTE — Progress Notes (Signed)
Patient ID: Ahnesti Townsend, female   DOB: April 13, 1946, 71 y.o.   MRN: [id number] Subjective: Shannen Flansburg is a 71 y.o. female patient seen today in office for diabetic foot check and for pain in left heel x 3weeks. States that it is a sharp pain that has intensified, states that it just hurts, worse 1st thing in the morning. Reports that she has tried tyelnol arthritis with no relief. Patient denies any bruising or trauma. Patient reports that she is not on any medication for her lupus. Patient has no other pedal complaints at this time.  FBS good last A1c 6.0  Patient Active Problem List   Diagnosis Date Noted  . Recurrent major depressive disorder, in full remission ([place]) 10/28/2014  . Multinodular goiter 08/30/2012  . Allergic rhinitis 05/30/2012  . Avitaminosis D 04/22/2011  . Type 2 diabetes mellitus ([place]) 01/05/2011  . Controlled type 2 diabetes mellitus without complication ([place]) 11/55/2080  . BP (high blood pressure) 01/04/2011  . Osteopenia 12/07/2010  . Glaucoma 02/27/2010  . 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 Systemic lupus erythematosus ([place]) 02/27/2010    Current Outpatient Prescriptions on File Prior to Visit  Medication Sig Dispense Refill  . amLODipine-olmesartan (AZOR) 10-40 MG tablet Take 1 tablet by mouth daily.  3  . aspirin 81 MG tablet Take by mouth.    Marland Kitchen atorvastatin (LIPITOR) 20 MG tablet     . Blood Glucose Calibration (OT ULTRA/FASTTK CNTRL SOLN) SOLN     . Blood Glucose Monitoring Suppl (ACCU-CHEK AVIVA PLUS) w/Device KIT     . carvedilol (COREG) 25 MG tablet     . diclofenac (VOLTAREN) 75 MG EC tablet Take 1 tablet (75 mg total) by mouth 2 (two) times daily. 90 tablet 5  . glucose blood (FREESTYLE LITE) test strip     . Lancets (FREESTYLE) lancets     . metFORMIN (GLUCOPHAGE) 1000 MG tablet     . Multiple Vitamin (MULTIVITAMIN) capsule Take by mouth.    . NONFORMULARY OR COMPOUNDED [place] compound:  Fluconazole 2%, Terbinafine  1%, DMSO, apply daily to affected area. 120 each 11  . polyethylene glycol-electrolytes (NULYTELY/GOLYTELY) 420 g solution See admin instructions.  0  . Travoprost, BAK Free, (TRAVATAN Z) 0.004 % SOLN ophthalmic solution      No current facility-administered medications on file prior to visit.     Allergies  Allergen Reactions  . Escitalopram Oxalate Other (See Comments)    Insomnia  . Codeine Nausea And Vomiting  . Niacin And Related Itching and Rash    Objective: Physical Exam  General: Well developed, nourished, no acute distress, awake, alert and oriented x 3  Vascular: Dorsalis pedis artery 2/4 bilateral, Posterior tibial artery 1/4 bilateral, skin temperature warm to warm proximal to distal bilateral lower extremities, no varicosities, pedal hair present bilateral.  Neurological: Gross sensation present via light touch bilateral. Protective intact bilateral. Vibratory diminished bilateral. Subjective burning to toes bilateral.   Dermatological: Skin is warm, dry, and supple bilateral, Nails 1-10 are elonagted thick, and discolored with mild subungal debris with most involved nails bilateral hallux with no acute ingrowing noted, no webspace macerations present bilateral, no open lesions present bilateral, no callus/corns/hyperkeratotic tissue present bilateral. No signs of infection bilateral.  Musculoskeletal: + pain at medial insertion at plantar fascia on left heel. Mild limited range of motion. History of arthritis. Muscular strength within normal limits. No pain with calf compression bilateral.  Xray left, inferior and posterior calcaneal spur, ankle and  midfoot joint space narrowing. Soft tissues within normal limits. No other perntainent acute findings.    Assessment and Plan:  Problem List Items Addressed This Visit    None    Visit Diagnoses    Plantar fasciitis of left foot    -  Primary   Foot pain, left       Relevant Orders   DG Foot Complete Left    Osteoarthritis of left ankle and foot       Diabetic polyneuropathy associated with type 2 diabetes mellitus ([place])         -Examined patient -Xray reviewed -After oral consent and aseptic prep, injected a mixture containing 1 ml of 2%  plain lidocaine, 1 ml 0.5% plain marcaine, 0.5 ml of kenalog 10 and 0.5 ml of dexamethasone phosphate into left heel at medial insertion without complication. Post-injection care discussed with patient.  -Dispensed fascial brace for left and instructed on use -Recommend daily stretching and icing -Continue with PRN Diclofenac for pain and arthritis; refilled today  -Recommend good supportive shoes  -Patient to return next month for follow up evaluation of left fasciitis and Diabetic nail care or sooner if symptoms worsen.  Landis Martins, DPM

## 2017-06-21 NOTE — Patient Instructions (Signed)

## 2017-07-19 ENCOUNTER — Ambulatory Visit (INDEPENDENT_AMBULATORY_CARE_PROVIDER_SITE_OTHER): Payer: Medicare Other | Admitting: Sports Medicine

## 2017-07-19 ENCOUNTER — Encounter: Payer: Self-pay | Admitting: Sports Medicine

## 2017-07-19 DIAGNOSIS — M79675 Pain in left toe(s): Secondary | ICD-10-CM

## 2017-07-19 DIAGNOSIS — M79674 Pain in right toe(s): Secondary | ICD-10-CM | POA: Diagnosis not present

## 2017-07-19 DIAGNOSIS — E1142 Type 2 diabetes mellitus with diabetic polyneuropathy: Secondary | ICD-10-CM | POA: Diagnosis not present

## 2017-07-19 DIAGNOSIS — B351 Tinea unguium: Secondary | ICD-10-CM | POA: Diagnosis not present

## 2017-07-19 NOTE — Progress Notes (Signed)
Patient ID: Deborah Weeks, female   DOB: 1946-10-10, 71 y.o.   MRN: [id number] Subjective: Deborah Weeks is a 71 y.o. female patient seen today in office for diabetic foot check and nail trim, states that she had no changes with medication. Patient has no other pedal complaints at this time, States that previous heel pain is much better and that diclofenac is also helping the arthritis that she has in her feet.   FBS not recorded today, last A1c 6.   Patient Active Problem List   Diagnosis Date Noted  . Recurrent major depressive disorder, in full remission ([place]) 10/28/2014  . Multinodular goiter 08/30/2012  . Allergic rhinitis 05/30/2012  . Avitaminosis D 04/22/2011  . Type 2 diabetes mellitus ([place]) 01/05/2011  . Controlled type 2 diabetes mellitus without complication ([place]) 91/79/1505  . BP (high blood pressure) 01/04/2011  . Osteopenia 12/07/2010  . Glaucoma 02/27/2010  . 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 Systemic lupus erythematosus ([place]) 02/27/2010    Current Outpatient Prescriptions on File Prior to Visit  Medication Sig Dispense Refill  . amLODipine-olmesartan (AZOR) 10-40 MG tablet Take 1 tablet by mouth daily.  3  . aspirin 81 MG tablet Take by mouth.    Marland Kitchen atorvastatin (LIPITOR) 20 MG tablet     . Blood Glucose Calibration (OT ULTRA/FASTTK CNTRL SOLN) SOLN     . Blood Glucose Monitoring Suppl (ACCU-CHEK AVIVA PLUS) w/Device KIT     . carvedilol (COREG) 25 MG tablet     . diclofenac (VOLTAREN) 75 MG EC tablet Take 1 tablet (75 mg total) by mouth 2 (two) times daily. 90 tablet 5  . glucose blood (FREESTYLE LITE) test strip     . Lancets (FREESTYLE) lancets     . metFORMIN (GLUCOPHAGE) 1000 MG tablet     . Multiple Vitamin (MULTIVITAMIN) capsule Take by mouth.    . NONFORMULARY OR COMPOUNDED [place] compound:  Fluconazole 2%, Terbinafine 1%, DMSO, apply daily to affected area. 120 each 11  . polyethylene glycol-electrolytes (NULYTELY/GOLYTELY) 420 g  solution See admin instructions.  0  . Travoprost, BAK Free, (TRAVATAN Z) 0.004 % SOLN ophthalmic solution      Current Facility-Administered Medications on File Prior to Visit  Medication Dose Route Frequency Provider Last Rate Last Dose  . triamcinolone acetonide (KENALOG) 10 MG/ML injection 10 mg  10 mg Other Once Landis Martins, DPM        Allergies  Allergen Reactions  . Escitalopram Oxalate Other (See Comments)    Insomnia  . Codeine Nausea And Vomiting  . Niacin And Related Itching and Rash    Objective: Physical Exam  General: Well developed, nourished, no acute distress, awake, alert and oriented x 3  Vascular: Dorsalis pedis artery 2/4 bilateral, Posterior tibial artery 1/4 bilateral, skin temperature warm to warm proximal to distal bilateral lower extremities, no varicosities, pedal hair present bilateral.  Neurological: Gross sensation present via light touch bilateral. Protective intact bilateral. Vibratory diminished bilateral. Subjective burning to toes bilateral.   Dermatological: Skin is warm, dry, and supple bilateral, Nails 1-10 are elonagted thick, and discolored with mild subungal debris with most involved nails bilateral hallux with no acute ingrowing noted, no webspace macerations present bilateral, no open lesions present bilateral, no callus/corns/hyperkeratotic tissue present bilateral. No signs of infection bilateral.  Musculoskeletal: No symptomatic boney deformities noted bilateral. No reproducible tenderness to both feet or plantar fascia. History of arthritis. Muscular strength within normal limits without pain on range of motion. No  pain with calf compression bilateral.   Assessment and Plan:  Problem List Items Addressed This Visit    None    Visit Diagnoses    Pain due to onychomycosis of toenails of both feet    -  Primary   Diabetic polyneuropathy associated with type 2 diabetes mellitus ([place])         -Examined patient -Nails x10  Debrided  with sterile nail nipper without incident  -Recommend good hygiene habits -Continue Lyrica as rx by PCP -Continue with PRN Diclofenac for pain and arthritis  -Patient to return in  2.5 to 3 months for follow up evaluation/Diabetic nail care or sooner if symptoms worsen.  Landis Martins, DPM

## 2017-08-15 ENCOUNTER — Encounter (HOSPITAL_COMMUNITY): Payer: Self-pay | Admitting: Emergency Medicine

## 2017-08-15 ENCOUNTER — Ambulatory Visit (HOSPITAL_COMMUNITY)
Admission: EM | Admit: 2017-08-15 | Discharge: 2017-08-15 | Disposition: A | Discharge status: Home / Self Care | Payer: Medicare Other

## 2017-08-15 ENCOUNTER — Ambulatory Visit (HOSPITAL_COMMUNITY)
Admission: EM | Admit: 2017-08-15 | Discharge: 2017-08-15 | Disposition: A | Discharge status: Home / Self Care | Payer: Medicare Other | Attending: Family Medicine | Admitting: Family Medicine

## 2017-08-15 DIAGNOSIS — J9801 Acute bronchospasm: Secondary | ICD-10-CM | POA: Diagnosis not present

## 2017-08-15 DIAGNOSIS — R0982 Postnasal drip: Secondary | ICD-10-CM | POA: Diagnosis not present

## 2017-08-15 DIAGNOSIS — R05 Cough: Secondary | ICD-10-CM | POA: Diagnosis not present

## 2017-08-15 DIAGNOSIS — R059 Cough, unspecified: Secondary | ICD-10-CM

## 2017-08-15 HISTORY — DX: Systemic lupus erythematosus, unspecified: M32.9

## 2017-08-15 HISTORY — DX: Reserved for concepts with insufficient information to code with codable children: IMO0002

## 2017-08-15 HISTORY — DX: Essential (primary) hypertension: I10

## 2017-08-15 HISTORY — DX: Type 2 diabetes mellitus without complications: E11.9

## 2017-08-15 MED ORDER — TRIAMCINOLONE ACETONIDE 40 MG/ML IJ SUSP
INTRAMUSCULAR | Status: AC
  Filled 2017-08-15: qty 1

## 2017-08-15 MED ORDER — PREDNISONE 50 MG PO TABS: ORAL_TABLET | ORAL | 0 refills | Status: DC

## 2017-08-15 MED ORDER — TRIAMCINOLONE ACETONIDE 40 MG/ML IJ SUSP
40.0000 mg | Freq: Once | INTRAMUSCULAR | Status: AC
  Administered 2017-08-15: 40 mg via INTRAMUSCULAR

## 2017-08-15 MED ORDER — ALBUTEROL SULFATE HFA 108 (90 BASE) MCG/ACT IN AERS: 2.0000 | INHALATION_SPRAY | RESPIRATORY_TRACT | 0 refills | Status: DC | PRN

## 2017-08-15 NOTE — Discharge Instructions (Signed)
Your laryngitis and your cough are partly due to the drainage in the back of your throat. He also have bronchospasm or wheezing which is contributing to your cough. The sure to take an antihistamine as directed. Take the prednisone as directed and take with food. Use the inhaler 2 puffs every 4 hours for cough and wheeze.

## 2017-08-15 NOTE — ED Provider Notes (Addendum)
[facility]    CSN: [id number] Arrival date & time: 08/15/17  1215     History   Chief Complaint Chief Complaint  Patient presents with  . Cough    HPI Deborah Weeks is a 71 y.o. female.   71 year old female presents to the urgent care with cough, laryngitis and PND for 3 days. She has a history of smoking but quit 3 years ago. Claims a minor sore throat.      Past Medical History:  Diagnosis Date  . Diabetes mellitus without complication ([place])   . Hypertension   . Lupus     Patient Active Problem List   Diagnosis Date Noted  . Recurrent major depressive disorder, in full remission ([place]) 10/28/2014  . Multinodular goiter 08/30/2012  . Allergic rhinitis 05/30/2012  . Avitaminosis D 04/22/2011  . Type 2 diabetes mellitus ([place]) 01/05/2011  . Controlled type 2 diabetes mellitus without complication ([place]) 56/38/7564  . BP (high blood pressure) 01/04/2011  . Osteopenia 12/07/2010  . Glaucoma 02/27/2010  . 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 Systemic lupus erythematosus ([place]) 02/27/2010    History reviewed. No pertinent surgical history.  OB History    No data available       Home Medications    Prior to Admission medications   Medication Sig Start Date End Date Taking? Authorizing Provider  amLODipine-olmesartan (AZOR) 10-40 MG tablet Take 1 tablet by mouth daily. 01/02/16  Yes [provider]  aspirin 81 MG tablet Take by mouth.   Yes [provider]  carvedilol (COREG) 25 MG tablet  05/26/15  Yes [provider]  diclofenac (VOLTAREN) 75 MG EC tablet Take 1 tablet (75 mg total) by mouth 2 (two) times daily. 06/21/17  Yes Landis Martins, DPM  metFORMIN (GLUCOPHAGE) 1000 MG tablet  09/01/15  Yes [provider]  albuterol (PROVENTIL HFA;VENTOLIN HFA) 108 (90 Base) MCG/ACT inhaler Inhale 2 puffs every 4 (four) hours as needed into the lungs for wheezing or shortness of breath. 08/15/17   Janne Napoleon, NP    Blood Glucose Calibration (OT ULTRA/FASTTK CNTRL SOLN) SOLN  10/09/14   [provider]  Blood Glucose Monitoring Suppl (ACCU-CHEK AVIVA PLUS) w/Device KIT  11/16/13   [provider]  glucose blood (FREESTYLE LITE) test strip  10/09/13   [provider]  Lancets (FREESTYLE) lancets  10/09/13   [provider]  Multiple Vitamin (MULTIVITAMIN) capsule Take by mouth.    [provider]  NONFORMULARY OR COMPOUNDED [place] compound:  Fluconazole 2%, Terbinafine 1%, DMSO, apply daily to affected area. 05/03/16   Landis Martins, DPM  polyethylene glycol-electrolytes (NULYTELY/GOLYTELY) 420 g solution See admin instructions. 01/12/16   [provider]  predniSONE (DELTASONE) 50 MG tablet 1 tab po daily for 4 days. Take with food. Start 08/16/17. 08/15/17   Janne Napoleon, NP  Travoprost, BAK Free, (TRAVATAN Z) 0.004 % SOLN ophthalmic solution  08/21/14   [provider]    Family History Family History  Problem Relation Age of Onset  . Breast cancer Cousin     Social History Social History   Tobacco Use  . Smoking status: Unknown If Ever Smoked  . Smokeless tobacco: Never Used  Substance Use Topics  . Alcohol use: Not on file  . Drug use: Not on file     Allergies   Escitalopram oxalate; Codeine; and Niacin and related   Review of Systems Review of Systems  Constitutional: Negative.  Noted in HPI otherwise Negative  HENT: Positive for congestion and postnasal drip.        Noted in HPI, otherwise negative  Eyes: Negative for pain.  Respiratory: Positive for cough and shortness of breath.   Cardiovascular: Negative for chest pain, palpitations and leg swelling.  Gastrointestinal: Negative.   Musculoskeletal: Negative.   Skin: Negative for rash.  Neurological: Negative.   Psychiatric/Behavioral: Negative.   All other systems reviewed and are negative.    Physical Exam Triage Vital Signs ED Triage  Vitals [08/15/17 1302]  Enc Vitals Group     BP (!) 157/86     Pulse Rate 90     Resp 18     Temp 99.2 F (37.3 C)     Temp Source Oral     SpO2 100 %     Weight      Height      Head Circumference      Peak Flow      Pain Score      Pain Loc      Pain Edu?      Excl. in [place]?    No data found.  Updated Vital Signs BP (!) 157/86   Pulse 90   Temp 99.2 F (37.3 C) (Oral)   Resp 18   LMP  (LMP Unknown)   SpO2 100%   Visual Acuity Right Eye Distance:   Left Eye Distance:   Bilateral Distance:    Right Eye Near:   Left Eye Near:    Bilateral Near:     Physical Exam  Constitutional: She is oriented to person, place, and time. She appears well-developed and well-nourished. No distress.  HENT:  Oropharynx with minor cobblestoning, clear PND and injection. No exudate or swelling.  Eyes: EOM are normal.  Neck: Normal range of motion. Neck supple.  Cardiovascular: Normal rate, regular rhythm and normal heart sounds.  Pulmonary/Chest: Effort normal. No respiratory distress.  Prolonged expiratory phase. Cough with inspiration. Forced cough reveals distant bilateral coarseness.  Musculoskeletal: Normal range of motion. She exhibits no edema.  Lymphadenopathy:    She has no cervical adenopathy.  Neurological: She is alert and oriented to person, place, and time.  Skin: Skin is warm and dry. No rash noted.  Psychiatric: She has a normal mood and affect.  Nursing note and vitals reviewed.    UC Treatments / Results  Labs (all labs ordered are listed, but only abnormal results are displayed) Labs Reviewed - No data to display  EKG  EKG Interpretation None       Radiology No results found.  Procedures Procedures (including critical care time)  Medications Ordered in UC Medications  triamcinolone acetonide (KENALOG-40) injection 40 mg (40 mg Intramuscular Given 08/15/17 1422)     Initial Impression / Assessment and Plan / UC Course  I have reviewed the  triage vital signs and the nursing notes.  Pertinent labs & imaging results that were available during my care of the patient were reviewed by me and considered in my medical decision making (see chart for details).    Your laryngitis and your cough are partly due to the drainage in the back of your throat. He also have bronchospasm or wheezing which is contributing to your cough. The sure to take an antihistamine as directed. Take the prednisone as directed and take with food. Use the inhaler 2 puffs every 4 hours for cough and wheeze.     Final Clinical Impressions(s) / UC Diagnoses  Final diagnoses:  Cough  PND (post-nasal drip)  Bronchospasm  Software error, No exposure to STD  New Prescriptions This SmartLink is deprecated. Use AVSMEDLIST instead to display the medication list for a patient.     Controlled Substance Prescriptions [place] Controlled Substance Registry consulted? Not Applicable   Janne Napoleon, NP 08/15/17 1406    Janne Napoleon, NP 08/15/17 1410    Janne Napoleon, NP 08/15/17 1410    Janne Napoleon, NP 08/15/17 2149

## 2017-08-15 NOTE — ED Triage Notes (Signed)
Pt c/o cough, has lost her voice. Denies any other symptoms.

## 2017-10-25 ENCOUNTER — Encounter: Payer: Self-pay | Admitting: Sports Medicine

## 2017-10-25 ENCOUNTER — Ambulatory Visit (INDEPENDENT_AMBULATORY_CARE_PROVIDER_SITE_OTHER): Payer: Medicare Other | Admitting: Sports Medicine

## 2017-10-25 DIAGNOSIS — B351 Tinea unguium: Secondary | ICD-10-CM | POA: Diagnosis not present

## 2017-10-25 DIAGNOSIS — E1142 Type 2 diabetes mellitus with diabetic polyneuropathy: Secondary | ICD-10-CM | POA: Diagnosis not present

## 2017-10-25 DIAGNOSIS — M79674 Pain in right toe(s): Secondary | ICD-10-CM

## 2017-10-25 DIAGNOSIS — M79675 Pain in left toe(s): Secondary | ICD-10-CM

## 2017-10-25 NOTE — Progress Notes (Signed)
Patient ID: Deborah Weeks, female   DOB: May 06, 1946, 72 y.o.   MRN: [id number] Subjective: Deborah Weeks is a 72 y.o. female patient seen today in office for diabetic foot check and nail trim, states that she had no changes with medications. Patient has no other pedal complaints at this time, States that previous heel pain is much better no flare up and that diclofenac is also helping the arthritis that she has in her feet.   FBS 119 this AM, last A1c 6.5.   Patient Active Problem List   Diagnosis Date Noted  . Recurrent major depressive disorder, in full remission ([place]) 10/28/2014  . Multinodular goiter 08/30/2012  . Allergic rhinitis 05/30/2012  . Avitaminosis D 04/22/2011  . Type 2 diabetes mellitus ([place]) 01/05/2011  . Controlled type 2 diabetes mellitus without complication ([place]) 54/49/2010  . BP (high blood pressure) 01/04/2011  . Osteopenia 12/07/2010  . Glaucoma 02/27/2010  . 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 Systemic lupus erythematosus ([place]) 02/27/2010    Current Outpatient Medications on File Prior to Visit  Medication Sig Dispense Refill  . albuterol (PROVENTIL HFA;VENTOLIN HFA) 108 (90 Base) MCG/ACT inhaler Inhale 2 puffs every 4 (four) hours as needed into the lungs for wheezing or shortness of breath. 1 Inhaler 0  . amLODipine-olmesartan (AZOR) 10-40 MG tablet Take 1 tablet by mouth daily.  3  . aspirin 81 MG tablet Take by mouth.    . Blood Glucose Calibration (OT ULTRA/FASTTK CNTRL SOLN) SOLN     . Blood Glucose Monitoring Suppl (ACCU-CHEK AVIVA PLUS) w/Device KIT     . carvedilol (COREG) 25 MG tablet     . diclofenac (VOLTAREN) 75 MG EC tablet Take 1 tablet (75 mg total) by mouth 2 (two) times daily. 90 tablet 5  . glucose blood (FREESTYLE LITE) test strip     . Lancets (FREESTYLE) lancets     . metFORMIN (GLUCOPHAGE) 1000 MG tablet     . Multiple Vitamin (MULTIVITAMIN) capsule Take by mouth.    . NONFORMULARY OR COMPOUNDED [place]  compound:  Fluconazole 2%, Terbinafine 1%, DMSO, apply daily to affected area. 120 each 11  . polyethylene glycol-electrolytes (NULYTELY/GOLYTELY) 420 g solution See admin instructions.  0  . predniSONE (DELTASONE) 50 MG tablet 1 tab po daily for 4 days. Take with food. Start 08/16/17. 4 tablet 0  . Travoprost, BAK Free, (TRAVATAN Z) 0.004 % SOLN ophthalmic solution      Current Facility-Administered Medications on File Prior to Visit  Medication Dose Route Frequency Provider Last Rate Last Dose  . triamcinolone acetonide (KENALOG) 10 MG/ML injection 10 mg  10 mg Other Once Landis Martins, DPM        Allergies  Allergen Reactions  . Escitalopram Oxalate Other (See Comments)    Insomnia  . Codeine Nausea And Vomiting  . Niacin And Related Itching and Rash    Objective: Physical Exam  General: Well developed, nourished, no acute distress, awake, alert and oriented x 3  Vascular: Dorsalis pedis artery 2/4 bilateral, Posterior tibial artery 1/4 bilateral, skin temperature warm to warm proximal to distal bilateral lower extremities, no varicosities, pedal hair present bilateral.  Neurological: Gross sensation present via light touch bilateral. Protective intact bilateral. Vibratory diminished bilateral. Subjective burning to toes bilateral.   Dermatological: Skin is warm, dry, and supple bilateral, Nails 1-10 are elonagted thick, and discolored with mild subungal debris with most involved nails bilateral hallux with no acute ingrowing noted, no webspace macerations present bilateral,  no open lesions present bilateral, no callus/corns/hyperkeratotic tissue present bilateral. No signs of infection bilateral.  Musculoskeletal: No symptomatic boney deformities noted bilateral. No reproducible tenderness to both feet or plantar fascia. History of arthritis. Muscular strength within normal limits without pain on range of motion. No pain with calf compression bilateral.   Assessment and Plan:   Problem List Items Addressed This Visit    None    Visit Diagnoses    Pain due to onychomycosis of toenails of both feet    -  Primary   Diabetic polyneuropathy associated with type 2 diabetes mellitus ([place])         -Examined patient -Nails x10  Debrided with sterile nail nipper without incident  -Recommend good hygiene habits -Continue Lyrica as rx by PCP -Continue with PRN Diclofenac for pain and arthritis  -Patient to return in  2.5 to 3 months for follow up evaluation/Diabetic nail care or sooner if symptoms worsen.  Landis Martins, DPM

## 2018-01-03 ENCOUNTER — Ambulatory Visit (INDEPENDENT_AMBULATORY_CARE_PROVIDER_SITE_OTHER): Payer: Medicare Other | Admitting: Sports Medicine

## 2018-01-03 ENCOUNTER — Encounter: Payer: Self-pay | Admitting: Sports Medicine

## 2018-01-03 DIAGNOSIS — M79675 Pain in left toe(s): Secondary | ICD-10-CM | POA: Diagnosis not present

## 2018-01-03 DIAGNOSIS — E1142 Type 2 diabetes mellitus with diabetic polyneuropathy: Secondary | ICD-10-CM | POA: Diagnosis not present

## 2018-01-03 DIAGNOSIS — M79674 Pain in right toe(s): Secondary | ICD-10-CM | POA: Diagnosis not present

## 2018-01-03 DIAGNOSIS — B351 Tinea unguium: Secondary | ICD-10-CM

## 2018-01-03 NOTE — Progress Notes (Signed)
Patient ID: Deborah Weeks, female   DOB: 02-20-1946, 72 y.o.   MRN: [id number] Subjective: Deborah Weeks is a 72 y.o. female patient seen today in office for diabetic foot check and nail trim, states that she had no changes with medications. Patient has no other pedal complaints at this time.   FBS not checked this AM, last A1c 6.5.   Patient Active Problem List   Diagnosis Date Noted  . Recurrent major depressive disorder, in full remission ([place]) 10/28/2014  . Multinodular goiter 08/30/2012  . Allergic rhinitis 05/30/2012  . Avitaminosis D 04/22/2011  . Type 2 diabetes mellitus ([place]) 01/05/2011  . Controlled type 2 diabetes mellitus without complication ([place]) 78/58/8502  . BP (high blood pressure) 01/04/2011  . Osteopenia 12/07/2010  . Glaucoma 02/27/2010  . 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 Systemic lupus erythematosus ([place]) 02/27/2010    Current Outpatient Medications on File Prior to Visit  Medication Sig Dispense Refill  . albuterol (PROVENTIL HFA;VENTOLIN HFA) 108 (90 Base) MCG/ACT inhaler Inhale 2 puffs every 4 (four) hours as needed into the lungs for wheezing or shortness of breath. 1 Inhaler 0  . amLODipine-olmesartan (AZOR) 10-40 MG tablet Take 1 tablet by mouth daily.  3  . aspirin 81 MG tablet Take by mouth.    . Blood Glucose Calibration (OT ULTRA/FASTTK CNTRL SOLN) SOLN     . Blood Glucose Monitoring Suppl (ACCU-CHEK AVIVA PLUS) w/Device KIT     . carvedilol (COREG) 25 MG tablet     . diclofenac (VOLTAREN) 75 MG EC tablet Take 1 tablet (75 mg total) by mouth 2 (two) times daily. 90 tablet 5  . glucose blood (FREESTYLE LITE) test strip     . Lancets (FREESTYLE) lancets     . metFORMIN (GLUCOPHAGE) 1000 MG tablet     . Multiple Vitamin (MULTIVITAMIN) capsule Take by mouth.    . NONFORMULARY OR COMPOUNDED [place] compound:  Fluconazole 2%, Terbinafine 1%, DMSO, apply daily to affected area. 120 each 11  . polyethylene glycol-electrolytes  (NULYTELY/GOLYTELY) 420 g solution See admin instructions.  0  . predniSONE (DELTASONE) 50 MG tablet 1 tab po daily for 4 days. Take with food. Start 08/16/17. 4 tablet 0  . Travoprost, BAK Free, (TRAVATAN Z) 0.004 % SOLN ophthalmic solution      Current Facility-Administered Medications on File Prior to Visit  Medication Dose Route Frequency Provider Last Rate Last Dose  . triamcinolone acetonide (KENALOG) 10 MG/ML injection 10 mg  10 mg Other Once Landis Martins, DPM        Allergies  Allergen Reactions  . Escitalopram Oxalate Other (See Comments)    Insomnia  . Codeine Nausea And Vomiting  . Niacin And Related Itching and Rash    Objective: Physical Exam  General: Well developed, nourished, no acute distress, awake, alert and oriented x 3  Vascular: Dorsalis pedis artery 2/4 bilateral, Posterior tibial artery 1/4 bilateral, skin temperature warm to warm proximal to distal bilateral lower extremities, no varicosities, pedal hair present bilateral.  Neurological: Gross sensation present via light touch bilateral. Protective intact bilateral. Vibratory diminished bilateral. Subjective burning to toes bilateral on Lyrica.   Dermatological: Skin is warm, dry, and supple bilateral, Nails 1-10 are elonagted thick, and discolored with mild subungal debris with most involved nails bilateral hallux with no acute ingrowing noted, no webspace macerations present bilateral, no open lesions present bilateral, no callus/corns/hyperkeratotic tissue present bilateral. No signs of infection bilateral.  Musculoskeletal: No symptomatic boney deformities noted  bilateral. Muscular strength within normal limits without pain on range of motion. No pain with calf compression bilateral.   Assessment and Plan:  Problem List Items Addressed This Visit    None    Visit Diagnoses    Pain due to onychomycosis of toenails of both feet    -  Primary   Diabetic polyneuropathy associated with type 2 diabetes  mellitus ([place])         -Examined patient -Nails x10  Debrided with sterile nail nipper without incident   -Patient to return in  2.5 to 3 months for follow up evaluation/Diabetic nail care or sooner if symptoms worsen.  Landis Martins, DPM

## 2018-03-07 ENCOUNTER — Ambulatory Visit (INDEPENDENT_AMBULATORY_CARE_PROVIDER_SITE_OTHER): Payer: Medicare Other | Admitting: Sports Medicine

## 2018-03-07 ENCOUNTER — Encounter: Payer: Self-pay | Admitting: Sports Medicine

## 2018-03-07 DIAGNOSIS — B351 Tinea unguium: Secondary | ICD-10-CM

## 2018-03-07 DIAGNOSIS — M79672 Pain in left foot: Secondary | ICD-10-CM

## 2018-03-07 DIAGNOSIS — M79674 Pain in right toe(s): Secondary | ICD-10-CM

## 2018-03-07 DIAGNOSIS — E1142 Type 2 diabetes mellitus with diabetic polyneuropathy: Secondary | ICD-10-CM | POA: Diagnosis not present

## 2018-03-07 DIAGNOSIS — M79671 Pain in right foot: Secondary | ICD-10-CM

## 2018-03-07 DIAGNOSIS — M79675 Pain in left toe(s): Secondary | ICD-10-CM

## 2018-03-07 NOTE — Progress Notes (Signed)
Patient ID: Deborah Weeks, female   DOB: 08-Jul-1946, 72 y.o.   MRN: [id number] Subjective: Deborah Weeks is a 72 y.o. female patient seen today in office for diabetic foot check and nail trim, states that she had no changes with medications. Patient has no other pedal complaints at this time.   FBS not checked this AM, last A1c 6.1.    Patient Active Problem List   Diagnosis Date Noted  . Recurrent major depressive disorder, in full remission ([place]) 10/28/2014  . Multinodular goiter 08/30/2012  . Allergic rhinitis 05/30/2012  . Avitaminosis D 04/22/2011  . Type 2 diabetes mellitus ([place]) 01/05/2011  . Controlled type 2 diabetes mellitus without complication ([place]) 05/24/4817  . BP (high blood pressure) 01/04/2011  . Osteopenia 12/07/2010  . Glaucoma 02/27/2010  . 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 Systemic lupus erythematosus ([place]) 02/27/2010    Current Outpatient Medications on File Prior to Visit  Medication Sig Dispense Refill  . albuterol (PROVENTIL HFA;VENTOLIN HFA) 108 (90 Base) MCG/ACT inhaler Inhale 2 puffs every 4 (four) hours as needed into the lungs for wheezing or shortness of breath. 1 Inhaler 0  . amLODipine-olmesartan (AZOR) 10-40 MG tablet Take 1 tablet by mouth daily.  3  . aspirin 81 MG tablet Take by mouth.    . Blood Glucose Calibration (OT ULTRA/FASTTK CNTRL SOLN) SOLN     . Blood Glucose Monitoring Suppl (ACCU-CHEK AVIVA PLUS) w/Device KIT     . carvedilol (COREG) 25 MG tablet     . diclofenac (VOLTAREN) 75 MG EC tablet Take 1 tablet (75 mg total) by mouth 2 (two) times daily. 90 tablet 5  . glucose blood (FREESTYLE LITE) test strip     . Lancets (FREESTYLE) lancets     . metFORMIN (GLUCOPHAGE) 1000 MG tablet     . Multiple Vitamin (MULTIVITAMIN) capsule Take by mouth.    . NONFORMULARY OR COMPOUNDED [place] compound:  Fluconazole 2%, Terbinafine 1%, DMSO, apply daily to affected area. 120 each 11  . polyethylene glycol-electrolytes  (NULYTELY/GOLYTELY) 420 g solution See admin instructions.  0  . predniSONE (DELTASONE) 50 MG tablet 1 tab po daily for 4 days. Take with food. Start 08/16/17. 4 tablet 0  . Travoprost, BAK Free, (TRAVATAN Z) 0.004 % SOLN ophthalmic solution      Current Facility-Administered Medications on File Prior to Visit  Medication Dose Route Frequency Provider Last Rate Last Dose  . triamcinolone acetonide (KENALOG) 10 MG/ML injection 10 mg  10 mg Other Once Landis Martins, DPM        Allergies  Allergen Reactions  . Escitalopram Oxalate Other (See Comments)    Insomnia  . Codeine Nausea And Vomiting  . Niacin And Related Itching and Rash    Objective: Physical Exam  General: Well developed, nourished, no acute distress, awake, alert and oriented x 3  Vascular: Dorsalis pedis artery 2/4 bilateral, Posterior tibial artery 1/4 bilateral, skin temperature warm to warm proximal to distal bilateral lower extremities, no varicosities, pedal hair present bilateral.  Neurological: Gross sensation present via light touch bilateral. Protective intact bilateral. Vibratory diminished bilateral. Subjective burning to toes bilateral on Lyrica.   Dermatological: Skin is warm, dry, and supple bilateral, Nails 1-10 are elonagted thick, and discolored with mild subungal debris with most involved nails bilateral hallux with no acute ingrowing noted, no webspace macerations present bilateral, no open lesions present bilateral, no callus/corns/hyperkeratotic tissue present bilateral. No signs of infection bilateral.  Musculoskeletal: No symptomatic boney deformities  noted bilateral. Muscular strength within normal limits without pain on range of motion. No pain with calf compression bilateral.   Assessment and Plan:  Problem List Items Addressed This Visit    None    Visit Diagnoses    Pain due to onychomycosis of toenails of both feet    -  Primary   Diabetic polyneuropathy associated with type 2 diabetes  mellitus ([facility])       Foot pain, bilateral         -Examined patient -Nails x10  Debrided with sterile nail nipper without incident   -Patient to return in  2.5 to 3 months for follow up evaluation/Diabetic nail care or sooner if symptoms worsen.  Landis Martins, DPM

## 2018-03-13 ENCOUNTER — Telehealth: Payer: Self-pay | Admitting: *Deleted

## 2018-03-13 DIAGNOSIS — M19079 Primary osteoarthritis, unspecified ankle and foot: Secondary | ICD-10-CM

## 2018-03-13 NOTE — Telephone Encounter (Signed)
Refill request for Diclofenac.

## 2018-03-14 MED ORDER — DICLOFENAC SODIUM 75 MG PO TBEC: 75.0000 mg | DELAYED_RELEASE_TABLET | Freq: Two times a day (BID) | ORAL | 5 refills | Status: DC

## 2018-03-14 NOTE — Telephone Encounter (Signed)
Refill Ok -Dr. Chauncey Cruel

## 2018-05-29 ENCOUNTER — Emergency Department (HOSPITAL_COMMUNITY)
Admission: EM | Admit: 2018-05-29 | Discharge: 2018-05-29 | Disposition: A | Discharge status: Home / Self Care | Payer: Medicare Other | Attending: Emergency Medicine | Admitting: Emergency Medicine

## 2018-05-29 ENCOUNTER — Encounter (HOSPITAL_COMMUNITY): Payer: Self-pay | Admitting: Emergency Medicine

## 2018-05-29 ENCOUNTER — Other Ambulatory Visit: Payer: Self-pay

## 2018-05-29 ENCOUNTER — Emergency Department (HOSPITAL_COMMUNITY): Payer: Medicare Other

## 2018-05-29 DIAGNOSIS — J209 Acute bronchitis, unspecified: Secondary | ICD-10-CM | POA: Insufficient documentation

## 2018-05-29 DIAGNOSIS — Z7984 Long term (current) use of oral hypoglycemic drugs: Secondary | ICD-10-CM | POA: Diagnosis not present

## 2018-05-29 DIAGNOSIS — I10 Essential (primary) hypertension: Secondary | ICD-10-CM | POA: Diagnosis not present

## 2018-05-29 DIAGNOSIS — Z7982 Long term (current) use of aspirin: Secondary | ICD-10-CM | POA: Insufficient documentation

## 2018-05-29 DIAGNOSIS — R7989 Other specified abnormal findings of blood chemistry: Secondary | ICD-10-CM | POA: Diagnosis not present

## 2018-05-29 DIAGNOSIS — R0602 Shortness of breath: Secondary | ICD-10-CM | POA: Diagnosis present

## 2018-05-29 DIAGNOSIS — Z87891 Personal history of nicotine dependence: Secondary | ICD-10-CM | POA: Insufficient documentation

## 2018-05-29 DIAGNOSIS — E1140 Type 2 diabetes mellitus with diabetic neuropathy, unspecified: Secondary | ICD-10-CM | POA: Insufficient documentation

## 2018-05-29 HISTORY — DX: Unspecified osteoarthritis, unspecified site: M19.90

## 2018-05-29 HISTORY — DX: Polyneuropathy, unspecified: G62.9

## 2018-05-29 HISTORY — DX: Pure hypercholesterolemia, unspecified: E78.00

## 2018-05-29 LAB — CBC
HCT: 45.6 % (ref 36.0–46.0)
Hemoglobin: 13.5 g/dL (ref 12.0–15.0)
MCH: 25 pg — ABNORMAL LOW (ref 26.0–34.0)
MCHC: 29.6 g/dL — ABNORMAL LOW (ref 30.0–36.0)
MCV: 84.6 fL (ref 78.0–100.0)
Platelets: 254 10*3/uL (ref 150–400)
RBC: 5.39 MIL/uL — ABNORMAL HIGH (ref 3.87–5.11)
RDW: 14.9 % (ref 11.5–15.5)
WBC: 5.5 10*3/uL (ref 4.0–10.5)

## 2018-05-29 LAB — BASIC METABOLIC PANEL
Anion gap: 10 (ref 5–15)
BUN: 17 mg/dL (ref 8–23)
CO2: 23 mmol/L (ref 22–32)
Calcium: 9.8 mg/dL (ref 8.9–10.3)
Chloride: 110 mmol/L (ref 98–111)
Creatinine, Ser: 0.96 mg/dL (ref 0.44–1.00)
GFR calc Af Amer: >60 mL/min (ref >60)
GFR calc non Af Amer: 58 mL/min — ABNORMAL LOW (ref >60)
Glucose, Bld: 183 mg/dL — ABNORMAL HIGH (ref 70–99)
Potassium: 5.1 mmol/L (ref 3.5–5.1)
Sodium: 143 mmol/L (ref 135–145)

## 2018-05-29 LAB — BRAIN NATRIURETIC PEPTIDE: B Natriuretic Peptide: 205.8 pg/mL — ABNORMAL HIGH (ref 0.0–100.0)

## 2018-05-29 LAB — I-STAT TROPONIN, ED: Troponin i, poc: 0.01 ng/mL (ref 0.00–0.08)

## 2018-05-29 MED ORDER — PREDNISONE 20 MG PO TABS
60.0000 mg | ORAL_TABLET | Freq: Once | ORAL | Status: AC
  Administered 2018-05-29: 60 mg via ORAL
  Filled 2018-05-29: qty 3

## 2018-05-29 MED ORDER — DOXYCYCLINE HYCLATE 100 MG PO CAPS: 100.0000 mg | ORAL_CAPSULE | Freq: Two times a day (BID) | ORAL | 0 refills | Status: AC

## 2018-05-29 MED ORDER — PREDNISONE 20 MG PO TABS: 40.0000 mg | ORAL_TABLET | Freq: Every day | ORAL | 0 refills | Status: AC

## 2018-05-29 MED ORDER — IPRATROPIUM-ALBUTEROL 0.5-2.5 (3) MG/3ML IN SOLN
3.0000 mL | Freq: Once | RESPIRATORY_TRACT | Status: AC
  Administered 2018-05-29: 3 mL via RESPIRATORY_TRACT
  Filled 2018-05-29: qty 3

## 2018-05-29 MED ORDER — AEROCHAMBER PLUS FLO-VU LARGE MISC
1.0000 | Freq: Once | Status: AC
  Administered 2018-05-29: 1

## 2018-05-29 MED ORDER — DOXYCYCLINE HYCLATE 100 MG PO TABS
100.0000 mg | ORAL_TABLET | Freq: Once | ORAL | Status: AC
  Administered 2018-05-29: 100 mg via ORAL
  Filled 2018-05-29: qty 1

## 2018-05-29 MED ORDER — FUROSEMIDE 20 MG PO TABS
40.0000 mg | ORAL_TABLET | Freq: Once | ORAL | Status: AC
  Administered 2018-05-29: 40 mg via ORAL
  Filled 2018-05-29: qty 2

## 2018-05-29 MED ORDER — ALBUTEROL SULFATE HFA 108 (90 BASE) MCG/ACT IN AERS
2.0000 | INHALATION_SPRAY | Freq: Once | RESPIRATORY_TRACT | Status: AC
  Administered 2018-05-29: 2 via RESPIRATORY_TRACT
  Filled 2018-05-29: qty 6.7

## 2018-05-29 NOTE — ED Provider Notes (Signed)
[facility] EMERGENCY DEPARTMENT Provider Note   CSN: [id number] Arrival date & time: 05/29/18  0049     History   Chief Complaint Chief Complaint  Patient presents with  . Shortness of Breath    HPI Deborah Weeks is a 72 y.o. female.  HPI 72 year old female with reported past medical history of lupus, recurrent bronchitis, here with cough and sputum production.  The patient states that over the last 2 to 3 days, she has had a moderate cough productive of yellow-green sputum.  The patient had a viral illness and cough 2 weeks ago, and never completely recovered.  Tonight, the patient was resting when she laid down, then developed worsening shortness of breath.  She felt like she was "rattling" in her chest.  She denies any associated chest pain.  She felt extremely short of breath.  She subsequent presents for evaluation.  While waiting, the patient states she feels mildly improved.  She denies any fevers.  Denies any chest pain.  Denies any increased lower extremity swelling.  No recent medication change.  She does have a history of previous smoking, but has not smoked in 3 years.  Denies formal history of COPD or asthma.  She does note that she has frequent cough after URIs.  Past Medical History:  Diagnosis Date  . Arthritis   . Diabetes mellitus without complication ([place])   . High cholesterol   . Hypertension   . Lupus ([place])   . Neuropathy     Patient Active Problem List   Diagnosis Date Noted  . Recurrent major depressive disorder, in full remission ([place]) 10/28/2014  . Multinodular goiter 08/30/2012  . Allergic rhinitis 05/30/2012  . Avitaminosis D 04/22/2011  . Type 2 diabetes mellitus ([place]) 01/05/2011  . Controlled type 2 diabetes mellitus without complication ([place]) 67/67/2094  . BP (high blood pressure) 01/04/2011  . Osteopenia 12/07/2010  . Glaucoma 02/27/2010  . 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 Systemic lupus erythematosus ([place])  02/27/2010    History reviewed. No pertinent surgical history.   OB History   None      Home Medications    Prior to Admission medications   Medication Sig Start Date End Date Taking? Authorizing Provider  albuterol (PROVENTIL HFA;VENTOLIN HFA) 108 (90 Base) MCG/ACT inhaler Inhale 2 puffs every 4 (four) hours as needed into the lungs for wheezing or shortness of breath. 08/15/17   Janne Napoleon, NP  amLODipine-olmesartan (AZOR) 10-40 MG tablet Take 1 tablet by mouth daily. 01/02/16   [provider]  aspirin 81 MG tablet Take by mouth.    [provider]  Blood Glucose Calibration (OT ULTRA/FASTTK CNTRL SOLN) SOLN  10/09/14   [provider]  Blood Glucose Monitoring Suppl (ACCU-CHEK AVIVA PLUS) w/Device KIT  11/16/13   [provider]  carvedilol (COREG) 25 MG tablet  05/26/15   [provider]  cyanocobalamin (SM VITAMIN B-12) 100 MCG tablet Take by mouth.    [provider]  diclofenac (VOLTAREN) 75 MG EC tablet Take 1 tablet (75 mg total) by mouth 2 (two) times daily. 03/14/18   Landis Martins, DPM  doxycycline (VIBRAMYCIN) 100 MG capsule Take 1 capsule (100 mg total) by mouth 2 (two) times daily for 7 days. 05/29/18 06/05/18  Duffy Bruce, MD  glucose blood (FREESTYLE LITE) test strip  10/09/13   [provider]  Lancets (FREESTYLE) lancets  10/09/13   [provider]  LYRICA 75 MG capsule TAKE 1 CAPSULE  BY MOUTH THREE TIMES A DAY 02/24/18   [provider]  metFORMIN (GLUCOPHAGE) 1000 MG tablet  09/01/15   [provider]  Multiple Vitamin (MULTIVITAMIN) capsule Take by mouth.    [provider]  NONFORMULARY OR COMPOUNDED [place] compound:  Fluconazole 2%, Terbinafine 1%, DMSO, apply daily to affected area. 05/03/16   Landis Martins, DPM  polyethylene glycol-electrolytes (NULYTELY/GOLYTELY) 420 g solution See admin instructions. 01/12/16   [provider]  predniSONE  (DELTASONE) 20 MG tablet Take 2 tablets (40 mg total) by mouth daily for 5 days. 05/29/18 06/03/18  Duffy Bruce, MD  Travoprost, BAK Free, (TRAVATAN Z) 0.004 % SOLN ophthalmic solution  08/21/14   [provider]    Family History Family History  Problem Relation Age of Onset  . Breast cancer Cousin     Social History Social History   Tobacco Use  . Smoking status: Former Research scientist (life sciences)  . Smokeless tobacco: Never Used  Substance Use Topics  . Alcohol use: Never    Alcohol/week: 0.0 standard drinks    Frequency: Never  . Drug use: Never     Allergies   Escitalopram oxalate; Codeine; and Niacin and related   Review of Systems Review of Systems  Constitutional: Positive for fatigue. Negative for chills and fever.  HENT: Negative for congestion and rhinorrhea.   Eyes: Negative for visual disturbance.  Respiratory: Positive for shortness of breath and wheezing. Negative for cough.   Cardiovascular: Negative for chest pain and leg swelling.  Gastrointestinal: Negative for abdominal pain, diarrhea, nausea and vomiting.  Genitourinary: Negative for dysuria and flank pain.  Musculoskeletal: Negative for neck pain and neck stiffness.  Skin: Negative for rash and wound.  Allergic/Immunologic: Negative for immunocompromised state.  Neurological: Negative for syncope, weakness and headaches.  All other systems reviewed and are negative.    Physical Exam Updated Vital Signs BP (!) 169/87 (BP Location: Right Arm)   Pulse 73   Temp 98.6 F (37 C) (Oral)   Resp 16   LMP  (LMP Unknown)   SpO2 100%   Physical Exam  Constitutional: She is oriented to person, place, and time. She appears well-developed and well-nourished. No distress.  HENT:  Head: Normocephalic and atraumatic.  Eyes: Conjunctivae are normal.  Neck: Neck supple.  Cardiovascular: Normal rate, regular rhythm and normal heart sounds.  Pulmonary/Chest: Effort normal. No respiratory distress. She has decreased  breath sounds. She has wheezes. She has no rhonchi. She has no rales.  Abdominal: Soft. She exhibits no distension. There is no tenderness.  Musculoskeletal: She exhibits no edema.  Neurological: She is alert and oriented to person, place, and time. She exhibits normal muscle tone.  Skin: Skin is warm. Capillary refill takes less than 2 seconds. No rash noted.  Nursing note and vitals reviewed.    ED Treatments / Results  Labs (all labs ordered are listed, but only abnormal results are displayed) Labs Reviewed  BASIC METABOLIC PANEL - Abnormal; Notable for the following components:      Result Value   Glucose, Bld 183 (*)    GFR calc non Af Amer 58 (*)    All other components within normal limits  CBC - Abnormal; Notable for the following components:   RBC 5.39 (*)    MCH 25.0 (*)    MCHC 29.6 (*)    All other components within normal limits  BRAIN NATRIURETIC PEPTIDE - Abnormal; Notable for the following components:   B Natriuretic Peptide 205.8 (*)  All other components within normal limits  I-STAT TROPONIN, ED    EKG EKG Interpretation  Date/Time:  Monday May 29 2018 00:53:56 EDT Ventricular Rate:  72 PR Interval:  162 QRS Duration: 82 QT Interval:  424 QTC Calculation: 464 R Axis:   6 Text Interpretation:  Normal sinus rhythm Septal infarct , age undetermined Abnormal ECG No significant change since last tracing Confirmed by Duffy Bruce [phone]) on 05/29/2018 2:47:00 AM   Radiology Dg Chest 2 View  Result Date: 05/29/2018 CLINICAL DATA:  Shortness of breath EXAM: CHEST - 2 VIEW COMPARISON:  05/24/2009 FINDINGS: Diffuse increased interstitial opacity. No consolidation or effusion. Normal heart size. No pneumothorax. IMPRESSION: Diffuse increased interstitial opacity may reflect acute interstitial edema or inflammatory process on underlying chronic lung disease. Electronically Signed   By: Donavan Foil M.D.   On: 05/29/2018 01:55    Procedures Procedures  (including critical care time)  Medications Ordered in ED Medications  predniSONE (DELTASONE) tablet 60 mg (60 mg Oral Given 05/29/18 0304)  doxycycline (VIBRA-TABS) tablet 100 mg (100 mg Oral Given 05/29/18 0304)  ipratropium-albuterol (DUONEB) 0.5-2.5 (3) MG/3ML nebulizer solution 3 mL (3 mLs Nebulization Given 05/29/18 0304)  albuterol (PROVENTIL HFA;VENTOLIN HFA) 108 (90 Base) MCG/ACT inhaler 2 puff (2 puffs Inhalation Given 05/29/18 0414)  AEROCHAMBER PLUS FLO-VU LARGE MISC 1 each (1 each Other Given 05/29/18 0414)  furosemide (LASIX) tablet 40 mg (40 mg Oral Given 05/29/18 0414)     Initial Impression / Assessment and Plan / ED Course  I have reviewed the triage vital signs and the nursing notes.  Pertinent labs & imaging results that were available during my care of the patient were reviewed by me and considered in my medical decision making (see chart for details).     72 year old female with history of lupus here with shortness of breath and cough.  Patient had a URI 2 weeks ago, and continues to have cough with sputum production.  Her chest x-ray today is consistent with likely atypical pneumonia, the interstitial edema is also on differential.  She has a reported history of possible CHF on Coreg, with history of pulmonary lupus.  I suspect symptoms are secondary to acute bronchitis in the setting of underlying lung disease.  She has no evidence of sepsis.  She is not hypoxic.  She does have history of smoking and wheezing on exam that resolved with breathing treatments and she has had symptomatic relief.  Will treat her with steroids and doxycycline.  Patient also given a dose of Lasix here.  Discussed likely diagnosis of infectious bronchitis, but possibility of underlying mild CHF as well.  Patient EKG is nonischemic, troponin negative, and she has no chest pain.  She would like to follow-up as an outpatient for this, which I think is reasonable.  Her dose of Lasix here should improve her  symptoms and will have her call her PCP early this morning to discuss her visit and need for further work-up.  Return precautions given in detail.  Final Clinical Impressions(s) / ED Diagnoses   Final diagnoses:  Acute bronchitis, unspecified organism  Elevated brain natriuretic peptide (BNP) level    ED Discharge Orders         Ordered    predniSONE (DELTASONE) 20 MG tablet  Daily     05/29/18 0407    doxycycline (VIBRAMYCIN) 100 MG capsule  2 times daily     05/29/18 0407           Duffy Bruce,  MD 05/29/18 [id number]

## 2018-05-29 NOTE — Discharge Instructions (Signed)
As we discussed, your symptoms could be due to multiple things - I suspect this is a bronchitis with a component of wheezing, for which we will start steroids and an antibiotics.  As we discussed, your fluid level (BNP) was also slightly elevated. We have given you a dose of lasix here. It is VERY IMPORTANT to call Dr. Coletta Memos early this week to discuss your visit and to arrange follow-up. I'd like for you to be seen early this week, and you may need a repeat echocardiogram or more lasix.

## 2018-05-29 NOTE — ED Notes (Signed)
ED Provider at bedside. 

## 2018-05-29 NOTE — ED Triage Notes (Signed)
C/o sob that started tonight- worse when lying down.  Also reports productive cough with yellow phlegm x 2-3 days.

## 2018-05-29 NOTE — ED Notes (Signed)
Pt sob while ambulating. O2 90% when getting back from the restroom.

## 2018-05-30 ENCOUNTER — Ambulatory Visit: Payer: Medicare Other | Admitting: Sports Medicine

## 2018-06-13 ENCOUNTER — Encounter: Payer: Self-pay | Admitting: Sports Medicine

## 2018-06-13 ENCOUNTER — Ambulatory Visit (INDEPENDENT_AMBULATORY_CARE_PROVIDER_SITE_OTHER): Payer: Medicare Other | Admitting: Sports Medicine

## 2018-06-13 DIAGNOSIS — B351 Tinea unguium: Secondary | ICD-10-CM | POA: Diagnosis not present

## 2018-06-13 DIAGNOSIS — M79675 Pain in left toe(s): Secondary | ICD-10-CM | POA: Diagnosis not present

## 2018-06-13 DIAGNOSIS — M79672 Pain in left foot: Secondary | ICD-10-CM

## 2018-06-13 DIAGNOSIS — M79671 Pain in right foot: Secondary | ICD-10-CM

## 2018-06-13 DIAGNOSIS — E1142 Type 2 diabetes mellitus with diabetic polyneuropathy: Secondary | ICD-10-CM

## 2018-06-13 DIAGNOSIS — M79674 Pain in right toe(s): Secondary | ICD-10-CM

## 2018-06-13 NOTE — Progress Notes (Signed)
Patient ID: Deborah Weeks, female   DOB: 1946-09-28, 72 y.o.   MRN: [id number] Subjective: Deborah Weeks is a 72 y.o. female patient seen today in office for diabetic foot check and nail trim. FBS not checked this AM was 119.  Patient reports since last visit she was in hospital but now is doing better with no new changes to medications or any other acute problems or issues noted at this time.  Patient Active Problem List   Diagnosis Date Noted  . Recurrent major depressive disorder, in full remission ([place]) 10/28/2014  . Multinodular goiter 08/30/2012  . Allergic rhinitis 05/30/2012  . Avitaminosis D 04/22/2011  . Type 2 diabetes mellitus ([place]) 01/05/2011  . Controlled type 2 diabetes mellitus without complication ([place]) 58/52/7782  . BP (high blood pressure) 01/04/2011  . Osteopenia 12/07/2010  . Glaucoma 02/27/2010  . 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 Systemic lupus erythematosus ([place]) 02/27/2010    Current Outpatient Medications on File Prior to Visit  Medication Sig Dispense Refill  . albuterol (PROVENTIL HFA;VENTOLIN HFA) 108 (90 Base) MCG/ACT inhaler Inhale 2 puffs every 4 (four) hours as needed into the lungs for wheezing or shortness of breath. 1 Inhaler 0  . amLODipine-olmesartan (AZOR) 10-40 MG tablet Take 1 tablet by mouth daily.  3  . aspirin 81 MG tablet Take by mouth.    . Blood Glucose Calibration (OT ULTRA/FASTTK CNTRL SOLN) SOLN     . Blood Glucose Monitoring Suppl (ACCU-CHEK AVIVA PLUS) w/Device KIT     . carvedilol (COREG) 25 MG tablet     . cyanocobalamin (SM VITAMIN B-12) 100 MCG tablet Take by mouth.    . diclofenac (VOLTAREN) 75 MG EC tablet Take 1 tablet (75 mg total) by mouth 2 (two) times daily. 90 tablet 5  . glucose blood (FREESTYLE LITE) test strip     . Lancets (FREESTYLE) lancets     . LYRICA 75 MG capsule TAKE 1 CAPSULE BY MOUTH THREE TIMES A DAY  1  . metFORMIN (GLUCOPHAGE) 1000 MG tablet     . Multiple Vitamin (MULTIVITAMIN) capsule Take  by mouth.    . NONFORMULARY OR COMPOUNDED [place] compound:  Fluconazole 2%, Terbinafine 1%, DMSO, apply daily to affected area. 120 each 11  . polyethylene glycol-electrolytes (NULYTELY/GOLYTELY) 420 g solution See admin instructions.  0  . Travoprost, BAK Free, (TRAVATAN Z) 0.004 % SOLN ophthalmic solution      Current Facility-Administered Medications on File Prior to Visit  Medication Dose Route Frequency Provider Last Rate Last Dose  . triamcinolone acetonide (KENALOG) 10 MG/ML injection 10 mg  10 mg Other Once Landis Martins, DPM        Allergies  Allergen Reactions  . Escitalopram Oxalate Other (See Comments)    Insomnia  . Codeine Nausea And Vomiting  . Niacin And Related Itching and Rash    Objective: Physical Exam  General: Well developed, nourished, no acute distress, awake, alert and oriented x 3  Vascular: Dorsalis pedis artery 2/4 bilateral, Posterior tibial artery 1/4 bilateral, skin temperature warm to warm proximal to distal bilateral lower extremities, no varicosities, pedal hair present bilateral.  Neurological: Gross sensation present via light touch bilateral. Protective intact bilateral. Vibratory diminished bilateral. Subjective burning to toes bilateral on Lyrica.   Dermatological: Skin is warm, dry, and supple bilateral, Nails 1-10 are elonagted thick, and discolored with mild subungal debris with most involved nails bilateral hallux with no acute ingrowing noted, no webspace macerations present bilateral, no open  lesions present bilateral, no callus/corns/hyperkeratotic tissue present bilateral. No signs of infection bilateral.  Musculoskeletal: No symptomatic boney deformities noted bilateral. Muscular strength within normal limits without pain on range of motion. No pain with calf compression bilateral.   Assessment and Plan:  Problem List Items Addressed This Visit    None    Visit Diagnoses    Pain due to onychomycosis of toenails of  both feet    -  Primary   Diabetic polyneuropathy associated with type 2 diabetes mellitus (HCC)       Foot pain, bilateral          -Examined patient -Nails x10  Debrided with sterile nail nipper without incident   -Patient to return in  2.5 to 3 months for follow up evaluation/Diabetic nail care or sooner if symptoms worsen.  Landis Martins, DPM

## 2018-07-27 ENCOUNTER — Encounter: Payer: Self-pay | Admitting: Cardiology

## 2018-07-27 NOTE — Progress Notes (Signed)
Cardiology Office Note:    Date:  07/28/2018   ID:  Deborah Weeks, DOB 1946/02/02, MRN [id number]  PCP:  [facility]  Cardiologist:  No primary care provider on file.  Electrophysiologist:  None   Referring MD: No ref. provider found     History of Present Illness:    Deborah Weeks is a 72 y.o. female here for evaluation of shortness of breath at the request of Dr. Ellender Hose.  Was in the emergency department on 05/29/2018 with shortness of breath, recurrent bronchitis, cough, sputum production, lupus.  Felt "rattling" in her chest.  Denied any chest pain at that time.  She was feeling extremely short of breath.  Has not smoked in over 3 years.  Wheezing was noted on exam in ER.  EKG personally reviewed showed sinus rhythm possible septal infarct pattern no significant changes.  Her BNP was mildly elevated at 205.  She reported to the ER physician that she had possible CHF and history of pulmonary lupus.  Suspicion at the time was secondary to acute bronchitis.  No hypoxia was noted.  Wheezing was noted.  Steroids and doxycycline was administered.  She was also given a dose of Lasix 79m given possibility of heart failure.  Troponin was normal.  Happens when walking quickly or bending over cleaning (pressure in chest at time, thinks belly too big). Mild edema in ankles.   Back in 2010 and echocardiogram was performed that showed normal ejection fraction with only trivial valvular lesions.  A nuclear stress test was also performed in 2010 showing no ischemia.  Normal EF.  She does admit to liking salt.  No fevers chills nausea vomiting syncope bleeding  Past Medical History:  Diagnosis Date  . Arthritis   . Diabetes mellitus without complication ([place]   . High cholesterol   . Hypertension   . Lupus ([place]   . Neuropathy     History reviewed. No pertinent surgical history.  Current Medications: Current Meds  Medication Sig  . albuterol (PROVENTIL  HFA;VENTOLIN HFA) 108 (90 Base) MCG/ACT inhaler Inhale 2 puffs every 4 (four) hours as needed into the lungs for wheezing or shortness of breath.  .Marland KitchenamLODipine-olmesartan (AZOR) 10-40 MG tablet Take 1 tablet by mouth daily.  .Marland Kitchenaspirin 81 MG tablet Take 81 mg by mouth daily.  . Blood Glucose Calibration (OT ULTRA/FASTTK CNTRL SOLN) SOLN   . Blood Glucose Monitoring Suppl (ACCU-CHEK AVIVA PLUS) w/Device KIT   . carvedilol (COREG) 25 MG tablet Take 25 mg by mouth daily.   . colesevelam (WELCHOL) 625 MG tablet Take 625 mg by mouth 2 (two) times daily.  . diclofenac (VOLTAREN) 75 MG EC tablet Take 1 tablet (75 mg total) by mouth 2 (two) times daily.  .Marland Kitchenglucose blood (FREESTYLE LITE) test strip   . Lancets (FREESTYLE) lancets   . LYRICA 75 MG capsule Take 75 mg by mouth 3 (three) times daily.  . metFORMIN (GLUCOPHAGE) 1000 MG tablet Take 500 mg by mouth daily.   . NONFORMULARY OR COMPOUNDED [place]ound:  Fluconazole 2%, Terbinafine 1%, DMSO, apply daily to affected area.   Current Facility-Administered Medications for the 07/28/18 encounter (Office Visit) with SJerline Pain MD  Medication  . triamcinolone acetonide (KENALOG) 10 MG/ML injection 10 mg     Allergies:   Atorvastatin; Escitalopram oxalate; Codeine; and Niacin and related   Social History   Socioeconomic History  . Marital status: Single    Spouse name: Not on  file  . Number of children: Not on file  . Years of education: Not on file  . Highest education level: Not on file  Occupational History  . Not on file  Social Needs  . Financial resource strain: Not on file  . Food insecurity:    Worry: Not on file    Inability: Not on file  . Transportation needs:    Medical: Not on file    Non-medical: Not on file  Tobacco Use  . Smoking status: Former Research scientist (life sciences)  . Smokeless tobacco: Never Used  Substance and Sexual Activity  . Alcohol use: Never    Alcohol/week: 0.0 standard drinks    Frequency: Never  .  Drug use: Never  . Sexual activity: Not on file  Lifestyle  . Physical activity:    Days per week: Not on file    Minutes per session: Not on file  . Stress: Not on file  Relationships  . Social connections:    Talks on phone: Not on file    Gets together: Not on file    Attends religious service: Not on file    Active member of club or organization: Not on file    Attends meetings of clubs or organizations: Not on file    Relationship status: Not on file  Other Topics Concern  . Not on file  Social History Narrative  . Not on file     Family History: The patient's family history includes Breast cancer in her cousin.  ROS:   Please see the history of present illness.     All other systems reviewed and are negative.  EKGs/Labs/Other Studies Reviewed:    The following studies were reviewed today: Prior lab work EKG office notes reviewed hemoglobin 13.5, creatinine 0.9  EKG:  EKG is  ordered today.  The ekg ordered today demonstrates 05/29/2018 shows normal sinus rhythm with possible septal infarct pattern personally reviewed and interpreted.  Recent Labs: 05/29/2018: B Natriuretic Peptide 205.8; BUN 17; Creatinine, Ser 0.96; Hemoglobin 13.5; Platelets 254; Potassium 5.1; Sodium 143  Recent Lipid Panel    Component Value Date/Time   CHOL (H) 05/25/2009 0540    209        ATP III CLASSIFICATION:  <200     mg/dL   Desirable  200-239  mg/dL   Borderline High  >=240    mg/dL   High          TRIG 225 (H) 05/25/2009 0540   HDL 29 (L) 05/25/2009 0540   CHOLHDL 7.2 05/25/2009 0540   VLDL 45 (H) 05/25/2009 0540   LDLCALC (H) 05/25/2009 0540    135        Total Cholesterol/HDL:CHD Risk Coronary Heart Disease Risk Table                     Men   Women  1/2 Average Risk   3.4   3.3  Average Risk       5.0   4.4  2 X Average Risk   9.6   7.1  3 X Average Risk  23.4   11.0        Use the calculated Patient Ratio above and the CHD Risk Table to determine the patient's CHD  Risk.        ATP III CLASSIFICATION (LDL):  <100     mg/dL   Optimal  100-129  mg/dL   Near or Above  Optimal  130-159  mg/dL   Borderline  160-189  mg/dL   High  >190     mg/dL   Very High    Physical Exam:    VS:  BP 140/70   Pulse 69   Ht _0  (1.753 m)   Wt 212 lb (96.2 kg)   LMP  (LMP Unknown)   SpO2 97%   BMI 31.31 kg/m     Wt Readings from Last 3 Encounters:  07/28/18 212 lb (96.2 kg)     GEN: Overweight well nourished, well developed in no acute distress HEENT: Normal NECK: No JVD; No carotid bruits LYMPHATICS: No lymphadenopathy CARDIAC: RRR, no murmurs, rubs, gallops RESPIRATORY:  Clear to auscultation without rales, wheezing or rhonchi  ABDOMEN: Soft, non-tender, non-distended MUSCULOSKELETAL:  No nephric and edema; No deformity  SKIN: Warm and dry NEUROLOGIC:  Alert and oriented x 3 PSYCHIATRIC:  Normal affect   ASSESSMENT:    1. Dyspnea, unspecified type   2. Other forms of angina pectoris ([place])   3. Other systemic lupus erythematosus with other organ involvement ([place])   4. Chest pain, unspecified type    PLAN:    In order of problems listed above:  Shortness of breath - We will go ahead and repeat echocardiogram to ensure proper structure and function.  She could have a degree of diastolic dysfunction given her advanced age. -BNP was mildly elevated.  During her prior ER visit, Lasix was administered x1. -Shortness of breath could be possibly an anginal equivalent and given her lupus, this is a risk factor for accelerated atherosclerosis. - We will set up for nuclear stress test to ensure that her shortness of breath is not a form of ischemia and that she does not have any high risk ischemia present.  Atypical chest pain - When bending over, sometimes experiences.  Is likely is a more of a restrictive phenomenon.  Nonetheless, checking a stress test in this situation makes sense.  Lupus - Per primary team.  In remission she  states  Overall decrease salt.  Follow-up with results of study.   Medication Adjustments/Labs and Tests Ordered: Current medicines are reviewed at length with the patient today.  Concerns regarding medicines are outlined above.  Orders Placed This Encounter  Procedures  . MYOCARDIAL PERFUSION IMAGING  . ECHOCARDIOGRAM COMPLETE   No orders of the defined types were placed in this encounter.   Patient Instructions  Medication Instructions:  The current medical regimen is effective;  continue present plan and medications.  If you need a refill on your cardiac medications before your next appointment, please call your pharmacy.   Testing/Procedures: Your physician has requested that you have an echocardiogram. Echocardiography is a painless test that uses sound waves to create images of your heart. It provides your doctor with information about the size and shape of your heart and how well your heart's chambers and valves are working. This procedure takes approximately one hour. There are no restrictions for this procedure.  Your physician has requested that you have a myoview. For further information please visit [email or web address]. Please follow instruction sheet, as given.  Follow-Up: Follow up as needed after the above testing.  Thank you for choosing [facility]!!          Signed, Candee Furbish, MD  07/28/2018 9:04 AM    [place]

## 2018-07-28 ENCOUNTER — Ambulatory Visit (INDEPENDENT_AMBULATORY_CARE_PROVIDER_SITE_OTHER): Payer: Medicare Other | Admitting: Cardiology

## 2018-07-28 ENCOUNTER — Encounter: Payer: Self-pay | Admitting: Cardiology

## 2018-07-28 ENCOUNTER — Encounter: Payer: Self-pay | Admitting: *Deleted

## 2018-07-28 VITALS — BP 140/70 | HR 69 | Ht 69.0 in | Wt 212.0 lb

## 2018-07-28 DIAGNOSIS — I208 Other forms of angina pectoris: Secondary | ICD-10-CM | POA: Diagnosis not present

## 2018-07-28 DIAGNOSIS — R0600 Dyspnea, unspecified: Secondary | ICD-10-CM

## 2018-07-28 DIAGNOSIS — R079 Chest pain, unspecified: Secondary | ICD-10-CM

## 2018-07-28 DIAGNOSIS — M3219 Other organ or system involvement in systemic lupus erythematosus: Secondary | ICD-10-CM

## 2018-07-28 DIAGNOSIS — I2089 Other forms of angina pectoris: Secondary | ICD-10-CM

## 2018-07-28 NOTE — Addendum Note (Signed)
Addended by: Shellia Cleverly on: 07/28/2018 09:15 AM   Modules accepted: Orders

## 2018-07-28 NOTE — Patient Instructions (Addendum)
Medication Instructions:  The current medical regimen is effective;  continue present plan and medications.  If you need a refill on your cardiac medications before your next appointment, please call your pharmacy.   Testing/Procedures: Your physician has requested that you have an echocardiogram. Echocardiography is a painless test that uses sound waves to create images of your heart. It provides your doctor with information about the size and shape of your heart and how well your heart's chambers and valves are working. This procedure takes approximately one hour. There are no restrictions for this procedure.  Your physician has requested that you have a lexiscan myoview. For further information please visit [email or web address]. Please follow instruction sheet, as given.  Follow-Up: Follow up as needed after the above testing.  Thank you for choosing [facility]!!

## 2018-08-03 ENCOUNTER — Telehealth (HOSPITAL_COMMUNITY): Payer: Self-pay | Admitting: *Deleted

## 2018-08-03 NOTE — Telephone Encounter (Signed)
Patient given detailed instructions per Myocardial Perfusion Study Information Sheet for the test on 08/08/18. Patient notified to arrive 15 minutes early and that it is imperative to arrive on time for appointment to keep from having the test rescheduled.  If you need to cancel or reschedule your appointment, please call the office within 24 hours of your appointment. . Patient verbalized understanding. Kirstie Peri

## 2018-08-08 ENCOUNTER — Ambulatory Visit (HOSPITAL_BASED_OUTPATIENT_CLINIC_OR_DEPARTMENT_OTHER): Payer: Medicare Other

## 2018-08-08 ENCOUNTER — Other Ambulatory Visit: Payer: Self-pay

## 2018-08-08 ENCOUNTER — Ambulatory Visit (HOSPITAL_COMMUNITY): Payer: Medicare Other | Attending: Cardiology

## 2018-08-08 DIAGNOSIS — R079 Chest pain, unspecified: Secondary | ICD-10-CM

## 2018-08-08 DIAGNOSIS — R0600 Dyspnea, unspecified: Secondary | ICD-10-CM | POA: Diagnosis not present

## 2018-08-08 LAB — MYOCARDIAL PERFUSION IMAGING
LV dias vol: 80 mL (ref 46–106)
LV sys vol: 23 mL
Peak HR: 84 {beats}/min
Rest HR: 68 {beats}/min
SDS: 0
SRS: 2
SSS: 2
TID: 1.11

## 2018-08-08 LAB — ECHOCARDIOGRAM COMPLETE
Height: 69 in
Weight: 3392 oz

## 2018-08-08 MED ORDER — PERFLUTREN LIPID MICROSPHERE
1.0000 mL | INTRAVENOUS | Status: AC | PRN
  Administered 2018-08-08: 2 mL via INTRAVENOUS

## 2018-08-08 MED ORDER — TECHNETIUM TC 99M TETROFOSMIN IV KIT
10.3000 | PACK | Freq: Once | INTRAVENOUS | Status: AC | PRN
  Administered 2018-08-08: 10.3 via INTRAVENOUS
  Filled 2018-08-08: qty 11

## 2018-08-08 MED ORDER — TECHNETIUM TC 99M TETROFOSMIN IV KIT
32.8000 | PACK | Freq: Once | INTRAVENOUS | Status: AC | PRN
  Administered 2018-08-08: 32.8 via INTRAVENOUS
  Filled 2018-08-08: qty 33

## 2018-08-08 MED ORDER — REGADENOSON 0.4 MG/5ML IV SOLN
0.4000 mg | Freq: Once | INTRAVENOUS | Status: AC
  Administered 2018-08-08: 0.4 mg via INTRAVENOUS

## 2018-08-09 ENCOUNTER — Other Ambulatory Visit: Payer: Self-pay

## 2018-08-09 ENCOUNTER — Telehealth: Payer: Self-pay | Admitting: *Deleted

## 2018-08-09 MED ORDER — FUROSEMIDE 20 MG PO TABS: 20.0000 mg | ORAL_TABLET | Freq: Every day | ORAL | 0 refills | Status: DC

## 2018-08-09 NOTE — Telephone Encounter (Signed)
Called pt re: stress test results.  Left a message for pt to call back.

## 2018-08-09 NOTE — Telephone Encounter (Signed)
-----   Message from Nuala Alpha, LPN sent at 15/83/0940  8:24 AM EDT -----   ----- Message ----- From: Jerline Pain, MD Sent: 08/09/2018   6:30 AM EDT To: Shellia Cleverly, RN, [facility] Triage  Low risk study, normal EF. Looks good Candee Furbish, MD

## 2018-08-09 NOTE — Telephone Encounter (Signed)
Pt returned my call and she has been made aware of her stress test results and she verbalized understanding.

## 2018-08-22 ENCOUNTER — Ambulatory Visit (INDEPENDENT_AMBULATORY_CARE_PROVIDER_SITE_OTHER): Payer: Medicare Other | Admitting: Sports Medicine

## 2018-08-22 ENCOUNTER — Encounter: Payer: Self-pay | Admitting: Sports Medicine

## 2018-08-22 DIAGNOSIS — M79674 Pain in right toe(s): Secondary | ICD-10-CM

## 2018-08-22 DIAGNOSIS — M79675 Pain in left toe(s): Secondary | ICD-10-CM

## 2018-08-22 DIAGNOSIS — E1142 Type 2 diabetes mellitus with diabetic polyneuropathy: Secondary | ICD-10-CM

## 2018-08-22 DIAGNOSIS — M79671 Pain in right foot: Secondary | ICD-10-CM

## 2018-08-22 DIAGNOSIS — B351 Tinea unguium: Secondary | ICD-10-CM | POA: Diagnosis not present

## 2018-08-22 DIAGNOSIS — M79672 Pain in left foot: Secondary | ICD-10-CM

## 2018-08-22 NOTE — Progress Notes (Signed)
Patient ID: Deborah Weeks, female   DOB: [birth year], 72 y.o.   MRN: [id number] Subjective: Deborah Weeks is a 72 y.o. female patient seen today in office for diabetic foot check and nail trim.  Reports that her blood sugar was not checked and has no idea on what it was today.  Patient does admit to some pain of sharp shooting and throbbing at bedtime in her toes.  Patient states that the pain only lasts for a few minutes after lying down but then slowly goes away.  Patient denies any rest pain or any pain that wakes up in the middle of the night or any pain that improves with dependency of leg.  Patient denies any calf pain any redness warmth swelling or any other acute symptoms at this time.  Patient Active Problem List   Diagnosis Date Noted  . Recurrent major depressive disorder, in full remission ([place]) 10/28/2014  . Multinodular goiter 08/30/2012  . Allergic rhinitis 05/30/2012  . Avitaminosis D 04/22/2011  . Type 2 diabetes mellitus ([place]) 01/05/2011  . Controlled type 2 diabetes mellitus without complication ([place]) 13/05/6577  . BP (high blood pressure) 01/04/2011  . Osteopenia 12/07/2010  . Glaucoma 02/27/2010  . 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 Systemic lupus erythematosus ([place]) 02/27/2010    Current Outpatient Medications on File Prior to Visit  Medication Sig Dispense Refill  . albuterol (PROVENTIL HFA;VENTOLIN HFA) 108 (90 Base) MCG/ACT inhaler Inhale 2 puffs every 4 (four) hours as needed into the lungs for wheezing or shortness of breath. 1 Inhaler 0  . amLODipine-olmesartan (AZOR) 10-40 MG tablet Take 1 tablet by mouth daily.  3  . aspirin 81 MG tablet Take 81 mg by mouth daily.    . Blood Glucose Calibration (OT ULTRA/FASTTK CNTRL SOLN) SOLN     . Blood Glucose Monitoring Suppl (ACCU-CHEK AVIVA PLUS) w/Device KIT     . carvedilol (COREG) 25 MG tablet Take 25 mg by mouth 2 (two) times daily.     . colesevelam (WELCHOL) 625 MG tablet Take 625 mg by mouth 2 (two)  times daily.    . diclofenac (VOLTAREN) 75 MG EC tablet Take 1 tablet (75 mg total) by mouth 2 (two) times daily. 90 tablet 5  . furosemide (LASIX) 20 MG tablet Take 1 tablet (20 mg total) by mouth daily. 90 tablet 0  . glucose blood (FREESTYLE LITE) test strip     . Lancets (FREESTYLE) lancets     . LYRICA 75 MG capsule Take 75 mg by mouth 3 (three) times daily.  1  . metFORMIN (GLUCOPHAGE) 1000 MG tablet Take 500 mg by mouth daily.     . NONFORMULARY OR COMPOUNDED [place] compound:  Fluconazole 2%, Terbinafine 1%, DMSO, apply daily to affected area. 120 each 11   Current Facility-Administered Medications on File Prior to Visit  Medication Dose Route Frequency Provider Last Rate Last Dose  . triamcinolone acetonide (KENALOG) 10 MG/ML injection 10 mg  10 mg Other Once Landis Martins, DPM        Allergies  Allergen Reactions  . Atorvastatin Other (See Comments)    cramps  . Escitalopram Oxalate Other (See Comments)    Insomnia  . Codeine Nausea And Vomiting  . Niacin And Related Itching and Rash    Objective: Physical Exam  General: Well developed, nourished, no acute distress, awake, alert and oriented x 3  Vascular: Dorsalis pedis artery 2/4 bilateral, Posterior tibial artery 1/4 bilateral, skin temperature warm to warm  proximal to distal bilateral lower extremities, no varicosities, pedal hair present bilateral.  Neurological: Gross sensation present via light touch bilateral. Protective intact bilateral. Vibratory diminished bilateral. Subjective burning to toes bilateral on Lyrica.   Dermatological: Skin is warm, dry, and supple bilateral, Nails 1-10 are elonagted thick, and discolored with mild subungal debris with most involved nails bilateral hallux with no acute ingrowing noted, no webspace macerations present bilateral, no open lesions present bilateral, no callus/corns/hyperkeratotic tissue present bilateral. No signs of infection  bilateral.  Musculoskeletal: No symptomatic boney deformities noted bilateral. Muscular strength within normal limits without pain on range of motion. No pain with calf compression bilateral.   Assessment and Plan:  Problem List Items Addressed This Visit    None    Visit Diagnoses    Pain due to onychomycosis of toenails of both feet    -  Primary   Diabetic polyneuropathy associated with type 2 diabetes mellitus (HCC)       Foot pain, bilateral          -Examined patient -Nails x10  Debrided with sterile nail nipper without incident   -Continue with Lyrica for neuropathy pain and advised patient if pain changes where it wakes her up in the middle the night or gets worse when she has to get up and move around her leg and will also do further testing of ABIs for baseline evaluation in the setting of diabetes with pain -Patient to return in  2.5 to 3 months for follow up evaluation/Diabetic nail care or sooner if symptoms worsen.  Landis Martins, DPM

## 2018-10-14 ENCOUNTER — Inpatient Hospital Stay (HOSPITAL_COMMUNITY)
Admission: EM | Admit: 2018-10-14 | Discharge: 2018-10-21 | DRG: 871 | Disposition: A | Discharge status: Home / Self Care | Payer: Medicare Other | Attending: Internal Medicine | Admitting: Internal Medicine

## 2018-10-14 ENCOUNTER — Other Ambulatory Visit: Payer: Self-pay

## 2018-10-14 ENCOUNTER — Emergency Department (HOSPITAL_COMMUNITY): Payer: Medicare Other

## 2018-10-14 DIAGNOSIS — J9601 Acute respiratory failure with hypoxia: Secondary | ICD-10-CM | POA: Diagnosis not present

## 2018-10-14 DIAGNOSIS — R042 Hemoptysis: Secondary | ICD-10-CM | POA: Diagnosis not present

## 2018-10-14 DIAGNOSIS — E1165 Type 2 diabetes mellitus with hyperglycemia: Secondary | ICD-10-CM | POA: Diagnosis not present

## 2018-10-14 DIAGNOSIS — A419 Sepsis, unspecified organism: Principal | ICD-10-CM | POA: Diagnosis present

## 2018-10-14 DIAGNOSIS — Z7984 Long term (current) use of oral hypoglycemic drugs: Secondary | ICD-10-CM | POA: Diagnosis not present

## 2018-10-14 DIAGNOSIS — M329 Systemic lupus erythematosus, unspecified: Secondary | ICD-10-CM | POA: Diagnosis not present

## 2018-10-14 DIAGNOSIS — Z6832 Body mass index (BMI) 32.0-32.9, adult: Secondary | ICD-10-CM

## 2018-10-14 DIAGNOSIS — Z79899 Other long term (current) drug therapy: Secondary | ICD-10-CM | POA: Diagnosis not present

## 2018-10-14 DIAGNOSIS — E785 Hyperlipidemia, unspecified: Secondary | ICD-10-CM | POA: Diagnosis present

## 2018-10-14 DIAGNOSIS — H409 Unspecified glaucoma: Secondary | ICD-10-CM | POA: Diagnosis present

## 2018-10-14 DIAGNOSIS — R0602 Shortness of breath: Secondary | ICD-10-CM | POA: Diagnosis not present

## 2018-10-14 DIAGNOSIS — J9611 Chronic respiratory failure with hypoxia: Secondary | ICD-10-CM | POA: Diagnosis present

## 2018-10-14 DIAGNOSIS — E119 Type 2 diabetes mellitus without complications: Secondary | ICD-10-CM

## 2018-10-14 DIAGNOSIS — J309 Allergic rhinitis, unspecified: Secondary | ICD-10-CM | POA: Diagnosis present

## 2018-10-14 DIAGNOSIS — E876 Hypokalemia: Secondary | ICD-10-CM | POA: Diagnosis present

## 2018-10-14 DIAGNOSIS — K5900 Constipation, unspecified: Secondary | ICD-10-CM | POA: Diagnosis present

## 2018-10-14 DIAGNOSIS — M3219 Other organ or system involvement in systemic lupus erythematosus: Secondary | ICD-10-CM | POA: Diagnosis not present

## 2018-10-14 DIAGNOSIS — E7800 Pure hypercholesterolemia, unspecified: Secondary | ICD-10-CM | POA: Diagnosis not present

## 2018-10-14 DIAGNOSIS — J441 Chronic obstructive pulmonary disease with (acute) exacerbation: Secondary | ICD-10-CM | POA: Diagnosis not present

## 2018-10-14 DIAGNOSIS — R8271 Bacteriuria: Secondary | ICD-10-CM | POA: Diagnosis present

## 2018-10-14 DIAGNOSIS — J181 Lobar pneumonia, unspecified organism: Secondary | ICD-10-CM

## 2018-10-14 DIAGNOSIS — J189 Pneumonia, unspecified organism: Secondary | ICD-10-CM | POA: Diagnosis present

## 2018-10-14 DIAGNOSIS — R079 Chest pain, unspecified: Secondary | ICD-10-CM | POA: Diagnosis not present

## 2018-10-14 DIAGNOSIS — R8290 Unspecified abnormal findings in urine: Secondary | ICD-10-CM | POA: Diagnosis present

## 2018-10-14 DIAGNOSIS — Z87891 Personal history of nicotine dependence: Secondary | ICD-10-CM

## 2018-10-14 DIAGNOSIS — I110 Hypertensive heart disease with heart failure: Secondary | ICD-10-CM | POA: Diagnosis not present

## 2018-10-14 DIAGNOSIS — Z885 Allergy status to narcotic agent status: Secondary | ICD-10-CM

## 2018-10-14 DIAGNOSIS — E1140 Type 2 diabetes mellitus with diabetic neuropathy, unspecified: Secondary | ICD-10-CM | POA: Diagnosis present

## 2018-10-14 DIAGNOSIS — J440 Chronic obstructive pulmonary disease with acute lower respiratory infection: Secondary | ICD-10-CM | POA: Diagnosis present

## 2018-10-14 DIAGNOSIS — I480 Paroxysmal atrial fibrillation: Secondary | ICD-10-CM | POA: Diagnosis present

## 2018-10-14 DIAGNOSIS — J690 Pneumonitis due to inhalation of food and vomit: Secondary | ICD-10-CM

## 2018-10-14 DIAGNOSIS — Z7982 Long term (current) use of aspirin: Secondary | ICD-10-CM | POA: Diagnosis not present

## 2018-10-14 DIAGNOSIS — K2100 Gastro-esophageal reflux disease with esophagitis, without bleeding: Secondary | ICD-10-CM

## 2018-10-14 DIAGNOSIS — Z888 Allergy status to other drugs, medicaments and biological substances status: Secondary | ICD-10-CM

## 2018-10-14 DIAGNOSIS — I5033 Acute on chronic diastolic (congestive) heart failure: Secondary | ICD-10-CM | POA: Diagnosis not present

## 2018-10-14 LAB — I-STAT TROPONIN, ED: Troponin i, poc: 0.02 ng/mL (ref 0.00–0.08)

## 2018-10-14 LAB — COMPREHENSIVE METABOLIC PANEL
ALT: 15 U/L (ref 0–44)
AST: 17 U/L (ref 15–41)
Albumin: 3.8 g/dL (ref 3.5–5.0)
Alkaline Phosphatase: 47 U/L (ref 38–126)
Anion gap: 11 (ref 5–15)
BUN: 6 mg/dL — ABNORMAL LOW (ref 8–23)
CO2: 22 mmol/L (ref 22–32)
Calcium: 9.1 mg/dL (ref 8.9–10.3)
Chloride: 100 mmol/L (ref 98–111)
Creatinine, Ser: 0.93 mg/dL (ref 0.44–1.00)
GFR calc Af Amer: >60 mL/min (ref >60)
GFR calc non Af Amer: >60 mL/min (ref >60)
Glucose, Bld: 180 mg/dL — ABNORMAL HIGH (ref 70–99)
Potassium: 3.4 mmol/L — ABNORMAL LOW (ref 3.5–5.1)
Sodium: 133 mmol/L — ABNORMAL LOW (ref 135–145)
Total Bilirubin: 1.1 mg/dL (ref 0.3–1.2)
Total Protein: 7.5 g/dL (ref 6.5–8.1)

## 2018-10-14 LAB — INFLUENZA PANEL BY PCR (TYPE A & B)
Influenza A By PCR: NEGATIVE
Influenza B By PCR: NEGATIVE

## 2018-10-14 LAB — CBC WITH DIFFERENTIAL/PLATELET
Abs Immature Granulocytes: 0 10*3/uL (ref 0.00–0.07)
Band Neutrophils: 10 %
Basophils Absolute: 0.2 10*3/uL — ABNORMAL HIGH (ref 0.0–0.1)
Basophils Relative: 2 %
Eosinophils Absolute: 0 10*3/uL (ref 0.0–0.5)
Eosinophils Relative: 0 %
HCT: 39.2 % (ref 36.0–46.0)
Hemoglobin: 12.4 g/dL (ref 12.0–15.0)
Lymphocytes Relative: 5 %
Lymphs Abs: 0.6 10*3/uL — ABNORMAL LOW (ref 0.7–4.0)
MCH: 25.5 pg — ABNORMAL LOW (ref 26.0–34.0)
MCHC: 31.6 g/dL (ref 30.0–36.0)
MCV: 80.7 fL (ref 80.0–100.0)
Monocytes Absolute: 0.7 10*3/uL (ref 0.1–1.0)
Monocytes Relative: 6 %
Neutro Abs: 9.7 10*3/uL — ABNORMAL HIGH (ref 1.7–7.7)
Neutrophils Relative %: 77 %
Platelets: 283 10*3/uL (ref 150–400)
RBC: 4.86 MIL/uL (ref 3.87–5.11)
RDW: 14 % (ref 11.5–15.5)
WBC: 11.2 10*3/uL — ABNORMAL HIGH (ref 4.0–10.5)
nRBC: 0 % (ref 0.0–0.2)

## 2018-10-14 LAB — URINALYSIS, ROUTINE W REFLEX MICROSCOPIC
Bacteria, UA: NONE SEEN
Bilirubin Urine: NEGATIVE
Glucose, UA: NEGATIVE mg/dL
Ketones, ur: NEGATIVE mg/dL
Nitrite: NEGATIVE
Protein, ur: 30 mg/dL — AB
Specific Gravity, Urine: 1.014 (ref 1.005–1.030)
pH: 5 (ref 5.0–8.0)

## 2018-10-14 LAB — I-STAT ARTERIAL BLOOD GAS, ED
Bicarbonate: 22.9 mmol/L (ref 20.0–28.0)
O2 Saturation: 88 %
TCO2: 24 mmol/L (ref 22–32)
pCO2 arterial: 32.1 mmHg (ref 32.0–48.0)
pH, Arterial: 7.461 — ABNORMAL HIGH (ref 7.350–7.450)
pO2, Arterial: 50 mmHg — ABNORMAL LOW (ref 83.0–108.0)

## 2018-10-14 LAB — I-STAT CG4 LACTIC ACID, ED: Lactic Acid, Venous: 1.76 mmol/L (ref 0.5–1.9)

## 2018-10-14 MED ORDER — ENOXAPARIN SODIUM 40 MG/0.4ML ~~LOC~~ SOLN
40.0000 mg | Freq: Every day | SUBCUTANEOUS | Status: DC
  Administered 2018-10-15 – 2018-10-16 (×2): 40 mg via SUBCUTANEOUS
  Filled 2018-10-14 (×2): qty 0.4

## 2018-10-14 MED ORDER — POTASSIUM CHLORIDE 10 MEQ/100ML IV SOLN
10.0000 meq | INTRAVENOUS | Status: AC
  Administered 2018-10-14 – 2018-10-15 (×2): 10 meq via INTRAVENOUS
  Filled 2018-10-14 (×2): qty 100

## 2018-10-14 MED ORDER — SODIUM CHLORIDE 0.9 % IV SOLN
INTRAVENOUS | Status: DC
  Administered 2018-10-15 (×3): via INTRAVENOUS

## 2018-10-14 MED ORDER — PANTOPRAZOLE SODIUM 40 MG IV SOLR
40.0000 mg | Freq: Two times a day (BID) | INTRAVENOUS | Status: DC
  Administered 2018-10-15 – 2018-10-16 (×4): 40 mg via INTRAVENOUS
  Filled 2018-10-14 (×4): qty 40

## 2018-10-14 MED ORDER — ACETAMINOPHEN 650 MG RE SUPP: 650.0000 mg | Freq: Four times a day (QID) | RECTAL | Status: DC | PRN

## 2018-10-14 MED ORDER — IBUPROFEN 400 MG PO TABS
600.0000 mg | ORAL_TABLET | Freq: Once | ORAL | Status: AC
  Administered 2018-10-14: 600 mg via ORAL
  Filled 2018-10-14: qty 1

## 2018-10-14 MED ORDER — METHYLPREDNISOLONE SODIUM SUCC 125 MG IJ SOLR
125.0000 mg | INTRAMUSCULAR | Status: AC
  Administered 2018-10-14: 125 mg via INTRAVENOUS
  Filled 2018-10-14: qty 2

## 2018-10-14 MED ORDER — INSULIN ASPART 100 UNIT/ML ~~LOC~~ SOLN
0.0000 [IU] | Freq: Four times a day (QID) | SUBCUTANEOUS | Status: DC
  Administered 2018-10-15 (×3): 3 [IU] via SUBCUTANEOUS
  Administered 2018-10-16: 2 [IU] via SUBCUTANEOUS
  Administered 2018-10-16: 3 [IU] via SUBCUTANEOUS

## 2018-10-14 MED ORDER — ONDANSETRON HCL 4 MG PO TABS: 4.0000 mg | ORAL_TABLET | Freq: Four times a day (QID) | ORAL | Status: DC | PRN

## 2018-10-14 MED ORDER — LORAZEPAM 2 MG/ML IJ SOLN
0.5000 mg | Freq: Once | INTRAMUSCULAR | Status: AC
  Administered 2018-10-15: 0.5 mg via INTRAVENOUS
  Filled 2018-10-14: qty 1

## 2018-10-14 MED ORDER — SODIUM CHLORIDE 0.9% FLUSH
3.0000 mL | Freq: Two times a day (BID) | INTRAVENOUS | Status: DC
  Administered 2018-10-15 – 2018-10-21 (×13): 3 mL via INTRAVENOUS

## 2018-10-14 MED ORDER — ACETAMINOPHEN 325 MG PO TABS
650.0000 mg | ORAL_TABLET | Freq: Four times a day (QID) | ORAL | Status: DC | PRN
  Administered 2018-10-15 – 2018-10-16 (×3): 650 mg via ORAL
  Filled 2018-10-14 (×3): qty 2

## 2018-10-14 MED ORDER — IPRATROPIUM-ALBUTEROL 0.5-2.5 (3) MG/3ML IN SOLN
3.0000 mL | RESPIRATORY_TRACT | Status: DC
  Administered 2018-10-15 – 2018-10-16 (×9): 3 mL via RESPIRATORY_TRACT
  Filled 2018-10-14 (×9): qty 3

## 2018-10-14 MED ORDER — ONDANSETRON HCL 4 MG/2ML IJ SOLN
4.0000 mg | Freq: Four times a day (QID) | INTRAMUSCULAR | Status: DC | PRN
  Administered 2018-10-16: 4 mg via INTRAVENOUS
  Filled 2018-10-14: qty 2

## 2018-10-14 MED ORDER — IPRATROPIUM-ALBUTEROL 0.5-2.5 (3) MG/3ML IN SOLN
3.0000 mL | Freq: Once | RESPIRATORY_TRACT | Status: AC
  Administered 2018-10-14: 3 mL via RESPIRATORY_TRACT
  Filled 2018-10-14: qty 3

## 2018-10-14 MED ORDER — SODIUM CHLORIDE 0.9 % IV SOLN
2.0000 g | INTRAVENOUS | Status: DC
  Administered 2018-10-14 – 2018-10-18 (×5): 2 g via INTRAVENOUS
  Filled 2018-10-14 (×5): qty 20

## 2018-10-14 MED ORDER — SODIUM CHLORIDE 0.9 % IV BOLUS
1000.0000 mL | Freq: Once | INTRAVENOUS | Status: AC
  Administered 2018-10-14: 1000 mL via INTRAVENOUS

## 2018-10-14 MED ORDER — SODIUM CHLORIDE 0.9 % IV SOLN
500.0000 mg | INTRAVENOUS | Status: DC
  Administered 2018-10-14 – 2018-10-15 (×2): 500 mg via INTRAVENOUS
  Filled 2018-10-14 (×2): qty 500

## 2018-10-14 NOTE — Progress Notes (Signed)
Pt placed on BiPAP. RT will continue to monitor pt.

## 2018-10-14 NOTE — ED Triage Notes (Signed)
C/o short of breath and fever started yesterday - temp is 103.2- took tylenol at 1600-- no relief. O2 sats = 80s on room air, placed on 2 l/m/[place] sats +94

## 2018-10-14 NOTE — ED Provider Notes (Signed)
[facility] Provider Note   CSN: [id number] Arrival date & time: 10/14/18  1809     History   Chief Complaint Chief Complaint  Patient presents with  . flu like sx  . Fever  . Shortness of Breath    HPI Deborah Weeks is a 73 y.o. female h/o recurrent bronchitis, diabetes, HLD, here for evaluation of shortness of breath onset yesterday. Associated with productive cough, fevers, chills, generalized weakness, diarrhea, constant diffuse CP. CP is worse when she cough and breaths in. Family member was sick recently and pt accompanied her to ER before symptoms began.  Denies nausea, vomiting, dysuria. Last tobacco use 4 years ago.   HPI  Past Medical History:  Diagnosis Date  . Arthritis   . Diabetes mellitus without complication ([place])   . High cholesterol   . Hypertension   . Lupus ([place])   . Neuropathy     Patient Active Problem List   Diagnosis Date Noted  . Acute respiratory failure with hypoxia ([place]) 10/14/2018  . Recurrent major depressive disorder, in full remission ([place]) 10/28/2014  . Multinodular goiter 08/30/2012  . Allergic rhinitis 05/30/2012  . Avitaminosis D 04/22/2011  . Type 2 diabetes mellitus ([place]) 01/05/2011  . Controlled type 2 diabetes mellitus without complication ([place]) 00/37/0488  . BP (high blood pressure) 01/04/2011  . Osteopenia 12/07/2010  . Glaucoma 02/27/2010  . 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 Systemic lupus erythematosus ([place]) 02/27/2010    No past surgical history on file.   OB History   No obstetric history on file.      Home Medications    Prior to Admission medications   Medication Sig Start Date End Date Taking? Authorizing Provider  acetaminophen (TYLENOL) 650 MG CR tablet Take 1,300 mg by mouth every 8 (eight) hours as needed for pain.   Yes [provider]  albuterol (PROVENTIL HFA;VENTOLIN HFA) 108 (90 Base) MCG/ACT inhaler Inhale 2 puffs every 4 (four) hours as  needed into the lungs for wheezing or shortness of breath. 08/15/17  Yes Mabe, Shanon Brow, NP  amLODipine-olmesartan (AZOR) 10-40 MG tablet Take 1 tablet by mouth daily. 01/02/16  Yes [provider]  aspirin 81 MG tablet Take 81 mg by mouth daily.   Yes [provider]  carvedilol (COREG) 25 MG tablet Take 25 mg by mouth 2 (two) times daily.  05/26/15  Yes [provider]  colesevelam (WELCHOL) 625 MG tablet Take 625 mg by mouth 2 (two) times daily. 06/23/18  Yes [provider]  diclofenac (VOLTAREN) 75 MG EC tablet Take 1 tablet (75 mg total) by mouth 2 (two) times daily. 03/14/18  Yes Stover, Titorya, DPM  furosemide (LASIX) 20 MG tablet Take 1 tablet (20 mg total) by mouth daily. 08/09/18 11/07/18 Yes Jerline Pain, MD  guaiFENesin (ROBITUSSIN) 100 MG/5ML SOLN Take 5 mLs by mouth every 4 (four) hours as needed for cough or to loosen phlegm.   Yes [provider]  LYRICA 75 MG capsule Take 75 mg by mouth 3 (three) times daily. 02/24/18  Yes [provider]  metFORMIN (GLUCOPHAGE) 1000 MG tablet Take 500 mg by mouth daily.  09/01/15  Yes [provider]  NONFORMULARY OR COMPOUNDED [place] compound:  Fluconazole 2%, Terbinafine 1%, DMSO, apply daily to affected area. Patient not taking: Reported on 10/14/2018 05/03/16   Landis Martins, DPM    Family History Family History  Problem Relation Age of Onset  . Breast  cancer Cousin     Social History Social History   Tobacco Use  . Smoking status: Former Research scientist (life sciences)  . Smokeless tobacco: Never Used  Substance Use Topics  . Alcohol use: Never    Alcohol/week: 0.0 standard drinks    Frequency: Never  . Drug use: Never     Allergies   Atorvastatin; Escitalopram oxalate; Codeine; and Niacin and related   Review of Systems Review of Systems  Constitutional: Positive for chills and fever.  Respiratory: Positive for cough and shortness of breath.   Cardiovascular: Positive for  chest pain.  Gastrointestinal: Positive for diarrhea.  All other systems reviewed and are negative.    Physical Exam Updated Vital Signs BP 135/65   Pulse 72   Temp (!) 103.2 F (39.6 C) (Oral)   Resp (!) 21   Ht 5' 7.5" (1.715 m)   Wt 95.3 kg   LMP  (LMP Unknown)   SpO2 96%   BMI 32.41 kg/m   Physical Exam Vitals signs and nursing note reviewed.  Constitutional:      Appearance: She is well-developed. She is diaphoretic.     Comments: Non toxic  HENT:     Head: Normocephalic and atraumatic.     Nose: Nose normal.  Eyes:     Conjunctiva/sclera: Conjunctivae normal.     Pupils: Pupils are equal, round, and reactive to light.  Neck:     Musculoskeletal: Normal range of motion.  Cardiovascular:     Rate and Rhythm: Normal rate and regular rhythm.  Pulmonary:     Effort: Tachypnea and respiratory distress present.     Breath sounds: Examination of the right-middle field reveals wheezing. Examination of the left-middle field reveals wheezing. Examination of the right-lower field reveals wheezing. Examination of the left-lower field reveals wheezing. Wheezing present.     Comments: Moderate respiratory distress. On 4 L [place].  Tachypnic. mentating well, speaking in full sentences.   Abdominal:     General: Bowel sounds are normal.     Palpations: Abdomen is soft.     Tenderness: There is no abdominal tenderness.     Comments: No G/R/R. No suprapubic or CVA tenderness. Negative Murphy's and McBurney's. Active BS to lower quadrants.   Musculoskeletal: Normal range of motion.  Skin:    General: Skin is warm.     Capillary Refill: Capillary refill takes less than 2 seconds.  Neurological:     Mental Status: She is alert and oriented to person, place, and time.  Psychiatric:        Behavior: Behavior normal.      ED Treatments / Results  Labs (all labs ordered are listed, but only abnormal results are displayed) Labs Reviewed  COMPREHENSIVE METABOLIC PANEL - Abnormal;  Notable for the following components:      Result Value   Sodium 133 (*)    Potassium 3.4 (*)    Glucose, Bld 180 (*)    BUN 6 (*)    All other components within normal limits  CBC WITH DIFFERENTIAL/PLATELET - Abnormal; Notable for the following components:   WBC 11.2 (*)    MCH 25.5 (*)    Neutro Abs 9.7 (*)    Lymphs Abs 0.6 (*)    Basophils Absolute 0.2 (*)    All other components within normal limits  URINALYSIS, ROUTINE W REFLEX MICROSCOPIC - Abnormal; Notable for the following components:   APPearance HAZY (*)    Hgb urine dipstick MODERATE (*)    Protein, ur 30 (*)  Leukocytes, UA LARGE (*)    All other components within normal limits  I-STAT ARTERIAL BLOOD GAS, ED - Abnormal; Notable for the following components:   pH, Arterial 7.461 (*)    pO2, Arterial 50.0 (*)    All other components within normal limits  CULTURE, BLOOD (ROUTINE X 2)  CULTURE, BLOOD (ROUTINE X 2)  INFLUENZA PANEL BY PCR (TYPE A & B)  I-STAT CG4 LACTIC ACID, ED  I-STAT TROPONIN, ED    EKG None  Radiology Dg Chest Port 1 View  Result Date: 10/14/2018 CLINICAL DATA:  Hypoxia and flu like symptoms. EXAM: PORTABLE CHEST 1 VIEW COMPARISON:  05/29/2018 FINDINGS: There is opacity extending from the left mid lung to the lung base, obscuring the left hemidiaphragm. Remainder of the lungs show prominent bronchovascular markings but are otherwise clear. Cardiac silhouette is normal in size. No mediastinal or hilar masses. Possible left pleural effusion. No convincing right effusion. No pneumothorax. Skeletal structures are grossly intact. IMPRESSION: Left mid to lower lung airspace opacity consistent with pneumonia. Electronically Signed   By: Lajean Manes M.D.   On: 10/14/2018 19:45    Procedures .Critical Care Performed by: Kinnie Feil, PA-C Authorized by: Kinnie Feil, PA-C   Critical care provider statement:    Critical care time (minutes):  45   Critical care was necessary to treat or  prevent imminent or life-threatening deterioration of the following conditions:  Respiratory failure   Critical care was time spent personally by me on the following activities:  Discussions with consultants, evaluation of patient's response to treatment, examination of patient, ordering and performing treatments and interventions, ordering and review of laboratory studies, ordering and review of radiographic studies, pulse oximetry, re-evaluation of patient's condition, obtaining history from patient or surrogate and review of old charts   I assumed direction of critical care for this patient from another provider in my specialty: no     (including critical care time)  Medications Ordered in ED Medications  cefTRIAXone (ROCEPHIN) 2 g in sodium chloride 0.9 % 100 mL IVPB (0 g Intravenous Stopped 10/14/18 2026)  azithromycin (ZITHROMAX) 500 mg in sodium chloride 0.9 % 250 mL IVPB (0 mg Intravenous Stopped 10/14/18 2157)  methylPREDNISolone sodium succinate (SOLU-MEDROL) 125 mg/2 mL injection 125 mg (has no administration in time range)  ipratropium-albuterol (DUONEB) 0.5-2.5 (3) MG/3ML nebulizer solution 3 mL (has no administration in time range)  potassium chloride 10 mEq in 100 mL IVPB (has no administration in time range)  ipratropium-albuterol (DUONEB) 0.5-2.5 (3) MG/3ML nebulizer solution 3 mL (3 mLs Nebulization Given 10/14/18 1943)  ibuprofen (ADVIL,MOTRIN) tablet 600 mg (600 mg Oral Given 10/14/18 1944)  sodium chloride 0.9 % bolus 1,000 mL (0 mLs Intravenous Stopped 10/14/18 2157)     Initial Impression / Assessment and Plan / ED Course  I have reviewed the triage vital signs and the nursing notes.  Pertinent labs & imaging results that were available during my care of the patient were reviewed by me and considered in my medical decision making (see chart for details).  Clinical Course as of Oct 14 2236  Sat Oct 14, 2018  2021 Hgb urine dipstickMarland Kitchen): MODERATE [CG]  2021 Leukocytes, UA(!):  LARGE [CG]  2021 WBC, UA: 21-50 [CG]  2021 Bacteria, UA: NONE SEEN [CG]  2021 IMPRESSION: Left mid to lower lung airspace opacity consistent with pneumonia.  DG Chest Port 1 View [CG]  2041 pH, Arterial(!): 7.461 [CG]  2041 pO2, Arterial(!): 50.0 [CG]    Clinical  Course User Index [CG] Kinnie Feil, PA-C    Code sepsis activated given hypoxia, tachypnea, fever.  Likely ILI  Vs PNA.    2040: CXR confirms LLL PNA. Arterial pH 7.46, pO2 50.   Moderate hgb, large leuks, 21-50 WBC but no bacteria seen in urine. She is not having UTI symptoms, will send for cx.  She is receiving rocephin/azithromycin in ER and 1 L IVF.  Will request admission for acute respiratory failure secondary to CAP.    Final Clinical Impressions(s) / ED Diagnoses   Final diagnoses:  Acute respiratory failure with hypoxia [facility])  Community acquired pneumonia of left lower lobe of lung [facility])    ED Discharge Orders    None       Arlean Hopping 10/14/18 2238    Margette Fast, MD 10/15/18 [phone]

## 2018-10-14 NOTE — H&P (Signed)
History and Physical    Deborah Weeks KGY:[id number] DOB: 07-28-1946 DOA: 10/14/2018  Referring MD/NP/PA: Carmon Sails, PA-C PCP: Bernerd Limbo, MD  Patient coming from: Home  Chief Complaint: Shortness of breath  I have personally briefly reviewed patient's old medical records in [place]   HPI: Deborah Weeks is a 73 y.o. female with medical history significant of HTN, HLD, lupus, DM type II; who presents with complaints of shortness of breath which started last 24 hours.  History is limited as patient is on BiPAP. She has had a cough, subjective fevers, chills, headache, nausea, vomiting x2, days 1 loose bowel movement, myalgias, generalized weakness, and chest pain.  Patient reports that her daughter was just recently in the hospital and was diagnosed with some viral illness as well.  Patient appears to have been hospitalized for atypical pneumonia( mycoplasma and viral) in August of 2019.  Patient wonders why she keeps getting pneumonia although she had both of her pneumonia vaccines.  ED Course: Upon admission to the emergency department patient was seen to be febrile up to 103.2 F, respirations 15-24, blood pressures maintained, O2 saturations as low as 85%.  ABG revealed pH 7.461 PCO2 32.1, PO2 50.  Patient had to be started on BiPAP due to respiratory distress.  Labs revealed WBC 11.2, sodium 133, potassium 3.4, and lactic acid 1.76.  Influenza screen negative.  Urinalysis positive for large leukocytes, negative nitrites, negative bacteria, and 21-50 WBCs.  Patient was given 1 L normal saline IV fluids, DuoNeb breathing treatment, ceftriaxone, and azithromycin.  TRH called to admit.  Review of Systems  Unable to perform ROS: Severe respiratory distress  Constitutional: Positive for chills, fever and malaise/fatigue.  HENT: Negative for congestion and nosebleeds.   Eyes: Negative for double vision and photophobia.  Respiratory: Positive for cough, sputum production and  shortness of breath.   Cardiovascular: Positive for chest pain. Negative for leg swelling.  Gastrointestinal: Positive for nausea and vomiting.  Genitourinary: Negative for dysuria and frequency.  Musculoskeletal: Positive for myalgias. Negative for falls.  Skin: Negative for rash.  Neurological: Positive for headaches. Negative for loss of consciousness.  Psychiatric/Behavioral: Negative for substance abuse. The patient is not nervous/anxious.     Past Medical History:  Diagnosis Date  . Arthritis   . Diabetes mellitus without complication ([place])   . High cholesterol   . Hypertension   . Lupus ([place])   . Neuropathy     No past surgical history on file.   reports that she has quit smoking. She has never used smokeless tobacco. She reports that she does not drink alcohol or use drugs.  Allergies  Allergen Reactions  . Atorvastatin Other (See Comments)    cramps  . Escitalopram Oxalate Other (See Comments)    Insomnia  . Codeine Nausea And Vomiting  . Niacin And Related Itching and Rash    Family History  Problem Relation Age of Onset  . Breast cancer Cousin     Prior to Admission medications   Medication Sig Start Date End Date Taking? Authorizing Provider  acetaminophen (TYLENOL) 650 MG CR tablet Take 1,300 mg by mouth every 8 (eight) hours as needed for pain.   Yes [provider]  albuterol (PROVENTIL HFA;VENTOLIN HFA) 108 (90 Base) MCG/ACT inhaler Inhale 2 puffs every 4 (four) hours as needed into the lungs for wheezing or shortness of breath. 08/15/17  Yes Mabe, Shanon Brow, NP  amLODipine-olmesartan (AZOR) 10-40 MG tablet Take 1 tablet by mouth daily. 01/02/16  Yes [provider]  aspirin 81 MG tablet Take 81 mg by mouth daily.   Yes [provider]  carvedilol (COREG) 25 MG tablet Take 25 mg by mouth 2 (two) times daily.  05/26/15  Yes [provider]  colesevelam (WELCHOL) 625 MG tablet Take 625 mg by mouth 2 (two) times daily. 06/23/18   Yes [provider]  diclofenac (VOLTAREN) 75 MG EC tablet Take 1 tablet (75 mg total) by mouth 2 (two) times daily. 03/14/18  Yes Stover, Titorya, DPM  furosemide (LASIX) 20 MG tablet Take 1 tablet (20 mg total) by mouth daily. 08/09/18 11/07/18 Yes Jerline Pain, MD  guaiFENesin (ROBITUSSIN) 100 MG/5ML SOLN Take 5 mLs by mouth every 4 (four) hours as needed for cough or to loosen phlegm.   Yes [provider]  LYRICA 75 MG capsule Take 75 mg by mouth 3 (three) times daily. 02/24/18  Yes [provider]  metFORMIN (GLUCOPHAGE) 1000 MG tablet Take 500 mg by mouth daily.  09/01/15  Yes [provider]  NONFORMULARY OR COMPOUNDED [place] compound:  Fluconazole 2%, Terbinafine 1%, DMSO, apply daily to affected area. Patient not taking: Reported on 10/14/2018 05/03/16   Landis Martins, DPM    Physical Exam:  Constitutional: Elderly female previously in some respiratory distress Vitals:   10/14/18 1945 10/14/18 2030 10/14/18 2130 10/14/18 2215  BP: (!) 147/73 (!) 142/69 (!) 156/72 135/65  Pulse: 85 80 88 72  Resp: (!) 21 (!) 22  (!) 21  Temp:      TempSrc:      SpO2: 97% 90% 96% 96%  Weight:      Height:       Eyes: PERRL, lids and conjunctivae normal ENMT: Mucous membranes are moist. Posterior pharynx clear of any exudate or lesions  Neck: normal, supple, no masses, no thyromegaly Respiratory: Respiratory effort improved while on BiPAP.  Expiratory wheezes appreciated. Cardiovascular: Regular rate and rhythm, no murmurs / rubs / gallops. No extremity edema. 2+ pedal pulses. No carotid bruits.  Abdomen: no tenderness, no masses palpated. No hepatosplenomegaly. Bowel sounds positive.  Musculoskeletal: no clubbing / cyanosis. No joint deformity upper and lower extremities. Good ROM, no contractures. Normal muscle tone.  Skin: no rashes, lesions, ulcers. No induration Neurologic: CN 2-12 grossly intact. Sensation intact, DTR normal. Strength 5/5  in all 4.  Psychiatric: Normal judgment and insight. Alert and oriented x 3. Normal mood.     Labs on Admission: I have personally reviewed following labs and imaging studies  CBC: Recent Labs  Lab 10/14/18 1927  WBC 11.2*  NEUTROABS 9.7*  HGB 12.4  HCT 39.2  MCV 80.7  PLT 626   Basic Metabolic Panel: Recent Labs  Lab 10/14/18 1927  NA 133*  K 3.4*  CL 100  CO2 22  GLUCOSE 180*  BUN 6*  CREATININE 0.93  CALCIUM 9.1   GFR: Estimated Creatinine Clearance: 65.4 mL/min (by C-G formula based on SCr of 0.93 mg/dL). Liver Function Tests: Recent Labs  Lab 10/14/18 1927  AST 17  ALT 15  ALKPHOS 47  BILITOT 1.1  PROT 7.5  ALBUMIN 3.8   No results for input(s): LIPASE, AMYLASE in the last 168 hours. No results for input(s): AMMONIA in the last 168 hours. Coagulation Profile: No results for input(s): INR, PROTIME in the last 168 hours. Cardiac Enzymes: No results for input(s): CKTOTAL, CKMB, CKMBINDEX, TROPONINI in the last 168 hours. BNP (last 3 results) No results for input(s): PROBNP in  the last 8760 hours. HbA1C: No results for input(s): HGBA1C in the last 72 hours. CBG: No results for input(s): GLUCAP in the last 168 hours. Lipid Profile: No results for input(s): CHOL, HDL, LDLCALC, TRIG, CHOLHDL, LDLDIRECT in the last 72 hours. Thyroid Function Tests: No results for input(s): TSH, T4TOTAL, FREET4, T3FREE, THYROIDAB in the last 72 hours. Anemia Panel: No results for input(s): VITAMINB12, FOLATE, FERRITIN, TIBC, IRON, RETICCTPCT in the last 72 hours. Urine analysis:    Component Value Date/Time   COLORURINE YELLOW 10/14/2018 1952   APPEARANCEUR HAZY (A) 10/14/2018 1952   LABSPEC 1.014 10/14/2018 1952   PHURINE 5.0 10/14/2018 1952   GLUCOSEU NEGATIVE 10/14/2018 1952   HGBUR MODERATE (A) 10/14/2018 1952   BILIRUBINUR NEGATIVE 10/14/2018 1952   KETONESUR NEGATIVE 10/14/2018 1952   PROTEINUR 30 (A) 10/14/2018 1952   UROBILINOGEN 0.2 05/01/2010 1724    NITRITE NEGATIVE 10/14/2018 1952   LEUKOCYTESUR LARGE (A) 10/14/2018 1952   Sepsis Labs: No results found for this or any previous visit (from the past 240 hour(s)).   Radiological Exams on Admission: Dg Chest Port 1 View  Result Date: 10/14/2018 CLINICAL DATA:  Hypoxia and flu like symptoms. EXAM: PORTABLE CHEST 1 VIEW COMPARISON:  05/29/2018 FINDINGS: There is opacity extending from the left mid lung to the lung base, obscuring the left hemidiaphragm. Remainder of the lungs show prominent bronchovascular markings but are otherwise clear. Cardiac silhouette is normal in size. No mediastinal or hilar masses. Possible left pleural effusion. No convincing right effusion. No pneumothorax. Skeletal structures are grossly intact. IMPRESSION: Left mid to lower lung airspace opacity consistent with pneumonia. Electronically Signed   By: Lajean Manes M.D.   On: 10/14/2018 19:45    EKG: Independently reviewed.  Sinus rhythm 85 bpm with premature atrial complexes  Assessment/Plan Acute respiratory failure with hypoxia, Sepsis 2/2 community-acquired pneumonia: Patient presents with 1 day of shortness of breath and cough.  Found to be febrile up to 103.2 F with tachypnea, WBC elevated 11.2, and peak acid reassuring at 1.76.  Chest x-ray showing a left mid to lower lung opacity concerning for pneumonia.  Influenza screens were negative. - Admit to progressive bed - Follow-up blood, urine, and sputum cultures - Add on respiratory virus panel - Duonebs every 4 hours - Solu-Medrol 125 mg IV x 1 dose now - Continue symptomatic treatment  Chest pain: Patient reports chest pain initial EKG shows no significant ischemic changes. - Trend cardiac troponin  Abnormal UA: Patient noted to have large leukocytes and WBCs on urinalysis but no bacteria. - Follow-up urine culture - Covered with antibiotics as seen above  Hypokalemia: Acute.  Initial potassium noted be 3.4 - Give 20 mEq of potassium chloride IV -  Continue to monitor and replace as needed  Essential hypertension - Restart blood pressure medications when able  History of lupus: Patient reports no recent flare.  Diabetes mellitus type 2: Blood glucoses are better - Hypoglycemic protocol - Hold metformin - CBGs every 6 hours with sensitive SSI  DVT prophylaxis: lovenox Code Status: Full  Family Communication: [place] plan of care with the patient and daughter present at bedside Disposition Plan: Likely discharge home in 2 to 3 days  Consults called: none  Admission status: inpatient  Norval Morton MD [facility] Pager [phone]   If 7PM-7AM, please contact night-coverage [email or web address] Password [facility]  10/14/2018, 10:33 PM

## 2018-10-15 ENCOUNTER — Encounter (HOSPITAL_COMMUNITY): Payer: Self-pay

## 2018-10-15 DIAGNOSIS — R8290 Unspecified abnormal findings in urine: Secondary | ICD-10-CM | POA: Diagnosis present

## 2018-10-15 DIAGNOSIS — R079 Chest pain, unspecified: Secondary | ICD-10-CM | POA: Diagnosis present

## 2018-10-15 DIAGNOSIS — E876 Hypokalemia: Secondary | ICD-10-CM | POA: Diagnosis present

## 2018-10-15 LAB — BASIC METABOLIC PANEL
Anion gap: 11 (ref 5–15)
BUN: 9 mg/dL (ref 8–23)
CO2: 20 mmol/L — ABNORMAL LOW (ref 22–32)
Calcium: 8.7 mg/dL — ABNORMAL LOW (ref 8.9–10.3)
Chloride: 107 mmol/L (ref 98–111)
Creatinine, Ser: 0.83 mg/dL (ref 0.44–1.00)
GFR calc Af Amer: >60 mL/min (ref >60)
GFR calc non Af Amer: >60 mL/min (ref >60)
Glucose, Bld: 215 mg/dL — ABNORMAL HIGH (ref 70–99)
Potassium: 3.8 mmol/L (ref 3.5–5.1)
Sodium: 138 mmol/L (ref 135–145)

## 2018-10-15 LAB — TROPONIN I
Troponin I: <0.03 ng/mL (ref <0.03)
Troponin I: <0.03 ng/mL (ref <0.03)
Troponin I: <0.03 ng/mL (ref <0.03)

## 2018-10-15 LAB — GLUCOSE, CAPILLARY
Glucose-Capillary: 199 mg/dL — ABNORMAL HIGH (ref 70–99)
Glucose-Capillary: 205 mg/dL — ABNORMAL HIGH (ref 70–99)
Glucose-Capillary: 201 mg/dL — ABNORMAL HIGH (ref 70–99)
Glucose-Capillary: 201 mg/dL — ABNORMAL HIGH (ref 70–99)
Glucose-Capillary: 233 mg/dL — ABNORMAL HIGH (ref 70–99)

## 2018-10-15 LAB — CBC
HCT: 39.8 % (ref 36.0–46.0)
Hemoglobin: 12.3 g/dL (ref 12.0–15.0)
MCH: 24.7 pg — ABNORMAL LOW (ref 26.0–34.0)
MCHC: 30.9 g/dL (ref 30.0–36.0)
MCV: 79.9 fL — ABNORMAL LOW (ref 80.0–100.0)
Platelets: 265 10*3/uL (ref 150–400)
RBC: 4.98 MIL/uL (ref 3.87–5.11)
RDW: 14 % (ref 11.5–15.5)
WBC: 11 10*3/uL — ABNORMAL HIGH (ref 4.0–10.5)
nRBC: 0 % (ref 0.0–0.2)

## 2018-10-15 LAB — MRSA PCR SCREENING: MRSA by PCR: NEGATIVE

## 2018-10-15 MED ORDER — NON FORMULARY: 3.0000 mg | Freq: Once | Status: DC

## 2018-10-15 MED ORDER — RESOURCE THICKENUP CLEAR PO POWD
ORAL | Status: DC | PRN
  Filled 2018-10-15: qty 125

## 2018-10-15 MED ORDER — GUAIFENESIN ER 600 MG PO TB12
600.0000 mg | ORAL_TABLET | Freq: Two times a day (BID) | ORAL | Status: DC
  Administered 2018-10-15 – 2018-10-21 (×13): 600 mg via ORAL
  Filled 2018-10-15 (×13): qty 1

## 2018-10-15 MED ORDER — MELATONIN 3 MG PO TABS
3.0000 mg | ORAL_TABLET | Freq: Once | ORAL | Status: AC
  Administered 2018-10-15: 3 mg via ORAL
  Filled 2018-10-15: qty 1

## 2018-10-15 MED ORDER — DEXTROMETHORPHAN POLISTIREX ER 30 MG/5ML PO SUER
30.0000 mg | Freq: Two times a day (BID) | ORAL | Status: DC
  Administered 2018-10-15 – 2018-10-18 (×7): 30 mg via ORAL
  Filled 2018-10-15 (×13): qty 5

## 2018-10-15 MED ORDER — PREDNISONE 20 MG PO TABS
50.0000 mg | ORAL_TABLET | Freq: Every day | ORAL | Status: DC
  Administered 2018-10-15 – 2018-10-16 (×2): 50 mg via ORAL
  Filled 2018-10-15 (×2): qty 2

## 2018-10-15 MED ORDER — BENZONATATE 100 MG PO CAPS
100.0000 mg | ORAL_CAPSULE | Freq: Three times a day (TID) | ORAL | Status: DC
  Administered 2018-10-15 – 2018-10-21 (×16): 100 mg via ORAL
  Filled 2018-10-15 (×17): qty 1

## 2018-10-15 NOTE — Progress Notes (Signed)
[facility] Progress Note  Patient: Deborah Weeks EXB:[id number]   PCP: Bernerd Limbo, MD DOB: [birth year]   DOA: 10/14/2018   DOS: 10/15/2018   Date of Service: the patient was seen and examined on 10/15/2018  Brief hospital course: Pt. with PMH of HTN, HLD, lupus, DM type II; admitted on 10/14/2018, presented with complaint of shortness of breath, was found to have acute pneumonia with COPD exacerbation. Currently further plan is continue current management.  Subjective: Feeling better, still has some cough.  No fever no chills.  No chest pain no nausea no vomiting.  Reports that she is actually choking while she is drinking fluid as well as using lozenges.  Telemetry: PACs  Assessment and Plan: Acute respiratory failure with hypoxia,  Sepsis POA Left lobar community-acquired pneumonia Mild hemoptysis.  Patient presents with 1 day of shortness of breath and cough.   Found to be febrile up to 103.2 F with tachypnea, WBC elevated 11.2, Chest x-ray showing a left mid to lower lung opacity concerning for pneumonia.   Influenza screens were negative.  Respiratory virus pathogen panel currently pending. - Needed BiPAP initially, continue to progressive bed - Follow-up blood, urine, and sputum cultures - Duonebs every 4 hours - Solu-Medrol 125 mg IV x 1, switch to p.o. prednisone. - Hemoptysis is secondary to coughing.  Will provide cough suppressants. - Repeat chest x-ray tomorrow.  Chest pain: Patient reports chest pain initial EKG shows no significant ischemic changes. - Negative cardiac troponin, currently resolved.  No further work-up.  Continue to monitor on telemetry for now.  Abnormal UA:   Patient noted to have large leukocytes and WBCs on urinalysis but no bacteria. - Follow-up urine culture - Covered with antibiotics as seen above  Hypokalemia:  Acute.  Initial potassium noted be 3.4 - Give 20 mEq of potassium chloride IV - Continue to monitor and replace as  needed  Essential hypertension - Restart blood pressure medications when able  History of lupus:  Patient reports no recent flare.  Type 2 diabetes mellitus, uncontrolled with hyperglycemia. hold her oral hypoglycemic agents. On insulin sliding scale moderate Check hemoglobin A1c.   Morbid obesity Body mass index is 32.41 kg/m.   Diet: regular diet DVT Prophylaxis: subcutaneous Heparin  Advance goals of care discussion: full code  Family Communication: family was present at bedside, at the time of interview. The pt provided permission to discuss medical plan with the family. Opportunity was given to ask question and all questions were answered satisfactorily.   Disposition:  Discharge to home.  Consultants: none Procedures: noen  Scheduled Meds: . benzonatate  100 mg Oral TID  . dextromethorphan  30 mg Oral BID  . enoxaparin (LOVENOX) injection  40 mg Subcutaneous Daily  . guaiFENesin  600 mg Oral BID  . insulin aspart  0-9 Units Subcutaneous Q6H  . ipratropium-albuterol  3 mL Nebulization Q4H  . pantoprazole (PROTONIX) IV  40 mg Intravenous Q12H  . predniSONE  50 mg Oral Q breakfast  . sodium chloride flush  3 mL Intravenous Q12H   Continuous Infusions: . sodium chloride 75 mL/hr at 10/15/18 1200  . azithromycin Stopped (10/14/18 2157)  . cefTRIAXone (ROCEPHIN)  IV Stopped (10/14/18 2026)   PRN Meds: acetaminophen **OR** acetaminophen, ondansetron **OR** ondansetron (ZOFRAN) IV, RESOURCE THICKENUP CLEAR Antibiotics: Anti-infectives (From admission, onward)   Start     Dose/Rate Route Frequency Ordered Stop   10/14/18 1915  cefTRIAXone (ROCEPHIN) 2 g in sodium chloride 0.9 % 100 mL IVPB  2 g 200 mL/hr over 30 Minutes Intravenous Every 24 hours 10/14/18 1914     10/14/18 1915  azithromycin (ZITHROMAX) 500 mg in sodium chloride 0.9 % 250 mL IVPB     500 mg 250 mL/hr over 60 Minutes Intravenous Every 24 hours 10/14/18 1914         Objective: Physical  Exam: Vitals:   10/15/18 1204 10/15/18 1216 10/15/18 1551 10/15/18 1644  BP: 133/67   (!) 144/79  Pulse: 78  79 86  Resp: 15  16 16   Temp: 97.8 F (36.6 C)   98.2 F (36.8 C)  TempSrc: Axillary   Oral  SpO2: 96% 95% 95% 98%  Weight:      Height:       No intake or output data in the 24 hours ending 10/15/18 1815 Filed Weights   10/14/18 1852  Weight: 95.3 kg   General: Alert, Awake and Oriented to Time, Place and Person. Appear in moderate distress, affect appropriate Eyes: PERRL, Conjunctiva normal ENT: Oral Mucosa clear moist. Neck: no JVD, no Abnormal Mass Or lumps Cardiovascular: S1 and S2 Present, no Murmur, Peripheral Pulses Present Respiratory: normal respiratory effort, Bilateral Air entry equal and Decreased, no use of accessory muscle, bilateral  Crackles, bilateral  wheezes Abdomen: Bowel Sound present, Soft and no tenderness, no hernia Skin: no redness, no Rash, no induration Extremities: no Pedal edema, no calf tenderness Neurologic: Grossly no focal neuro deficit. Bilaterally Equal motor strength  Data Reviewed: CBC: Recent Labs  Lab 10/14/18 1927 10/15/18 0730  WBC 11.2* 11.0*  NEUTROABS 9.7*  --   HGB 12.4 12.3  HCT 39.2 39.8  MCV 80.7 79.9*  PLT 283 010   Basic Metabolic Panel: Recent Labs  Lab 10/14/18 1927 10/15/18 0730  NA 133* 138  K 3.4* 3.8  CL 100 107  CO2 22 20*  GLUCOSE 180* 215*  BUN 6* 9  CREATININE 0.93 0.83  CALCIUM 9.1 8.7*    Liver Function Tests: Recent Labs  Lab 10/14/18 1927  AST 17  ALT 15  ALKPHOS 47  BILITOT 1.1  PROT 7.5  ALBUMIN 3.8   No results for input(s): LIPASE, AMYLASE in the last 168 hours. No results for input(s): AMMONIA in the last 168 hours. Coagulation Profile: No results for input(s): INR, PROTIME in the last 168 hours. Cardiac Enzymes: Recent Labs  Lab 10/15/18 0212 10/15/18 0730 10/15/18 1408  TROPONINI <0.03 <0.03 <0.03   BNP (last 3 results) No results for input(s): PROBNP in the  last 8760 hours. CBG: Recent Labs  Lab 10/15/18 0203 10/15/18 0456 10/15/18 0829 10/15/18 1259 10/15/18 1644  GLUCAP 199* 205* 201* 201* 233*   Studies: Dg Chest Port 1 View  Result Date: 10/14/2018 CLINICAL DATA:  Hypoxia and flu like symptoms. EXAM: PORTABLE CHEST 1 VIEW COMPARISON:  05/29/2018 FINDINGS: There is opacity extending from the left mid lung to the lung base, obscuring the left hemidiaphragm. Remainder of the lungs show prominent bronchovascular markings but are otherwise clear. Cardiac silhouette is normal in size. No mediastinal or hilar masses. Possible left pleural effusion. No convincing right effusion. No pneumothorax. Skeletal structures are grossly intact. IMPRESSION: Left mid to lower lung airspace opacity consistent with pneumonia. Electronically Signed   By: Lajean Manes M.D.   On: 10/14/2018 19:45     Time spent: 35 minutes  Author: Berle Mull, MD [facility] Pager: [phone] 10/15/2018 6:15 PM  Between 7PM-7AM, please contact night-coverage at [email or web address], password [facility]

## 2018-10-15 NOTE — Plan of Care (Signed)
Continue to monitor

## 2018-10-15 NOTE — Progress Notes (Signed)
SLP Cancellation Note  Patient Details Name: Deborah Weeks MRN: [id number] DOB: [birth year]   Cancelled treatment:       Reason Eval/Treat Not Completed: Patient at procedure or test/unavailable. Pt having breathing treatment. Will continue efforts.  Deneise Lever, [place], CCC-SLP Speech-Language Pathologist Acute Rehabilitation Services Pager: [phone] Office: [phone]    Aliene Altes 10/15/2018, 12:22 PM

## 2018-10-15 NOTE — Evaluation (Signed)
Clinical/Bedside Swallow Evaluation Patient Details  Name: Deborah Weeks MRN: [id number] Date of Birth: 22-Oct-1945  Today's Date: 10/15/2018 Time: SLP Start Time (ACUTE ONLY): 1645 SLP Stop Time (ACUTE ONLY): 1705 SLP Time Calculation (min) (ACUTE ONLY): 20 min  Past Medical History:  Past Medical History:  Diagnosis Date  . Arthritis   . Diabetes mellitus without complication ([place])   . High cholesterol   . Hypertension   . Lupus ([place])   . Neuropathy    Past Surgical History: No past surgical history on file. HPI:  Deborah Weeks is a 73 y.o. female with medical history significant of HTN, HLD, lupus, DM type II, admitted with acute respiratory failure with hypoxia, Sepsis 2/2 community-acquired pneumonia. Chest x-ray showing a left mid to lower lung opacity concerning for pneumonia.  Influenza screens were negative. Required BiPAP overnight. Prior Esophagrams (most recent 03/02/11) showed non-specific esophageal motility disorder, hiatal hernia. 05/29/2009: Possible trace penetration and minimal, nonobstructive anterior esophageal web.    Assessment / Plan / Recommendation Clinical Impression   Patient presents with functional oropharyngeal swallow with appearance of adequate airway protection at bedside. No overt signs of aspiration observed despite challenging with consecutive sips of thin liquids in excess of 3 oz, or with puree, regular solid. Pt denies coughing or choking with meals, although she states she sometimes coughs on saliva when she falls asleep with a hard candy in her mouth. She sucks on hard candies frequently due to xerostomia. SLP educated and strongly cautioned re: aspiration/choking/airway obstruction risk and encouraged pt to try oral rinse/sips of water when sitting upright in bed. She endorses heartburn occasionally and belching with stomach acid coming up. She states she used to take Nexium but stopped a couple of years ago. Given her esophageal history, greatest risk  for aspiration appears to be post-prandial. Education provided re: esophageal precautions and environmental modifications: upright posture during and after meals, smaller meals, avoid eating 2 hours before bed, elevate head of bed at night. Recommend regular diet, thin liquids with esophageal precautions. If concerns for aspiration persist, consider esophageal assessment (barium swallow). No further skilled ST needs identifed, SLP will s/o.    SLP Visit Diagnosis: Dysphagia, unspecified (R13.10)    Aspiration Risk  Mild aspiration risk;Moderate aspiration risk    Diet Recommendation Regular;Thin liquid   Liquid Administration via: Cup;Straw Medication Administration: Whole meds with liquid Supervision: Patient able to self feed Compensations: Slow rate;Small sips/bites;Follow solids with liquid Postural Changes: Seated upright at 90 degrees;Remain upright for at least 30 minutes after po intake    Other  Recommendations Recommended Consults: Consider GI evaluation Oral Care Recommendations: Oral care BID   Follow up Recommendations        Frequency and Duration            Prognosis Prognosis for Safe Diet Advancement: Good      Swallow Study   General Date of Onset: 10/14/18 HPI: Deborah Weeks is a 73 y.o. female with medical history significant of HTN, HLD, lupus, DM type II, admitted with acute respiratory failure with hypoxia, Sepsis 2/2 community-acquired pneumonia. Chest x-ray showing a left mid to lower lung opacity concerning for pneumonia.  Influenza screens were negative. Required BiPAP overnight. Prior Esophagrams (most recent 03/02/11) showed non-specific esophageal motility disorder, hiatal hernia. 05/29/2009: Possible trace penetration and minimal, nonobstructive anterior esophageal web.  Type of Study: Bedside Swallow Evaluation Previous Swallow Assessment: see HPI Diet Prior to this Study: Dysphagia 2 (chopped);Nectar-thick liquids(per MD, not on modified diet prior  to admission) Temperature Spikes Noted: Yes(103.2) Respiratory Status: Room air History of Recent Intubation: No Behavior/Cognition: Alert;Cooperative;Pleasant mood Oral Cavity Assessment: Within Functional Limits Oral Care Completed by SLP: No Oral Cavity - Dentition: Edentulous Vision: Functional for self-feeding Self-Feeding Abilities: Able to feed self Patient Positioning: Upright in bed Baseline Vocal Quality: Normal Volitional Cough: Strong Volitional Swallow: Able to elicit    Oral/Motor/Sensory Function Overall Oral Motor/Sensory Function: Within functional limits   Ice Chips Ice chips: Not tested   Thin Liquid Thin Liquid: Within functional limits Presentation: Cup;Straw;Self Fed    Nectar Thick Nectar Thick Liquid: Not tested   Honey Thick Honey Thick Liquid: Not tested   Puree Puree: Within functional limits Presentation: Self Fed;Spoon   Solid     Solid: Within functional limits Presentation: [place], [place], [place] Speech-Language Pathologist Acute Rehabilitation Services Pager: [phone] Office: [phone]  Aliene Altes 10/15/2018,5:15 PM

## 2018-10-16 ENCOUNTER — Encounter (HOSPITAL_COMMUNITY): Payer: Self-pay

## 2018-10-16 ENCOUNTER — Inpatient Hospital Stay (HOSPITAL_COMMUNITY): Payer: Medicare Other

## 2018-10-16 LAB — RESPIRATORY PANEL BY PCR

## 2018-10-16 LAB — URINE CULTURE

## 2018-10-16 LAB — BASIC METABOLIC PANEL
Anion gap: 10 (ref 5–15)
BUN: 15 mg/dL (ref 8–23)
CO2: 22 mmol/L (ref 22–32)
Calcium: 8.5 mg/dL — ABNORMAL LOW (ref 8.9–10.3)
Chloride: 108 mmol/L (ref 98–111)
Creatinine, Ser: 0.82 mg/dL (ref 0.44–1.00)
GFR calc Af Amer: >60 mL/min (ref >60)
GFR calc non Af Amer: >60 mL/min (ref >60)
Glucose, Bld: 202 mg/dL — ABNORMAL HIGH (ref 70–99)
Potassium: 3.8 mmol/L (ref 3.5–5.1)
Sodium: 140 mmol/L (ref 135–145)

## 2018-10-16 LAB — HEMOGLOBIN A1C
Hgb A1c MFr Bld: 6.1 % — ABNORMAL HIGH (ref 4.8–5.6)
Mean Plasma Glucose: 128.37 mg/dL

## 2018-10-16 LAB — GLUCOSE, CAPILLARY
Glucose-Capillary: 157 mg/dL — ABNORMAL HIGH (ref 70–99)
Glucose-Capillary: 194 mg/dL — ABNORMAL HIGH (ref 70–99)
Glucose-Capillary: 198 mg/dL — ABNORMAL HIGH (ref 70–99)
Glucose-Capillary: 215 mg/dL — ABNORMAL HIGH (ref 70–99)

## 2018-10-16 LAB — CBC
HCT: 34.7 % — ABNORMAL LOW (ref 36.0–46.0)
Hemoglobin: 10.8 g/dL — ABNORMAL LOW (ref 12.0–15.0)
MCH: 24.7 pg — ABNORMAL LOW (ref 26.0–34.0)
MCHC: 31.1 g/dL (ref 30.0–36.0)
MCV: 79.4 fL — ABNORMAL LOW (ref 80.0–100.0)
Platelets: 250 10*3/uL (ref 150–400)
RBC: 4.37 MIL/uL (ref 3.87–5.11)
RDW: 14.1 % (ref 11.5–15.5)
WBC: 11.5 10*3/uL — ABNORMAL HIGH (ref 4.0–10.5)
nRBC: 0 % (ref 0.0–0.2)

## 2018-10-16 LAB — LEGIONELLA PNEUMOPHILA SEROGP 1 UR AG: L. pneumophila Serogp 1 Ur Ag: NEGATIVE

## 2018-10-16 LAB — MAGNESIUM: Magnesium: 1.9 mg/dL (ref 1.7–2.4)

## 2018-10-16 MED ORDER — IPRATROPIUM-ALBUTEROL 0.5-2.5 (3) MG/3ML IN SOLN
3.0000 mL | Freq: Three times a day (TID) | RESPIRATORY_TRACT | Status: DC
  Administered 2018-10-16 – 2018-10-17 (×3): 3 mL via RESPIRATORY_TRACT
  Filled 2018-10-16 (×3): qty 3

## 2018-10-16 MED ORDER — GUAIFENESIN 100 MG/5ML PO SOLN: 5.0000 mL | ORAL | Status: DC | PRN

## 2018-10-16 MED ORDER — ASPIRIN EC 81 MG PO TBEC
81.0000 mg | DELAYED_RELEASE_TABLET | Freq: Every day | ORAL | Status: DC
  Administered 2018-10-16: 81 mg via ORAL
  Filled 2018-10-16: qty 1

## 2018-10-16 MED ORDER — METHYLPREDNISOLONE SODIUM SUCC 125 MG IJ SOLR
60.0000 mg | Freq: Two times a day (BID) | INTRAMUSCULAR | Status: DC
  Administered 2018-10-16 – 2018-10-19 (×7): 60 mg via INTRAVENOUS
  Filled 2018-10-16 (×7): qty 2

## 2018-10-16 MED ORDER — COLESEVELAM HCL 625 MG PO TABS
625.0000 mg | ORAL_TABLET | Freq: Two times a day (BID) | ORAL | Status: DC
  Filled 2018-10-16: qty 1

## 2018-10-16 MED ORDER — PANTOPRAZOLE SODIUM 40 MG PO TBEC: 40.0000 mg | DELAYED_RELEASE_TABLET | Freq: Two times a day (BID) | ORAL | Status: DC

## 2018-10-16 MED ORDER — FUROSEMIDE 10 MG/ML IJ SOLN
20.0000 mg | Freq: Once | INTRAMUSCULAR | Status: AC
  Administered 2018-10-16: 20 mg via INTRAVENOUS
  Filled 2018-10-16: qty 2

## 2018-10-16 MED ORDER — METOPROLOL TARTRATE 5 MG/5ML IV SOLN: 5.0000 mg | INTRAVENOUS | Status: DC | PRN

## 2018-10-16 MED ORDER — PREGABALIN 75 MG PO CAPS
75.0000 mg | ORAL_CAPSULE | Freq: Three times a day (TID) | ORAL | Status: DC
  Administered 2018-10-16 – 2018-10-21 (×16): 75 mg via ORAL
  Filled 2018-10-16 (×15): qty 1

## 2018-10-16 MED ORDER — HYDROCOD POLST-CPM POLST ER 10-8 MG/5ML PO SUER
5.0000 mL | Freq: Two times a day (BID) | ORAL | Status: DC
  Administered 2018-10-16 – 2018-10-17 (×4): 5 mL via ORAL
  Filled 2018-10-16 (×4): qty 5

## 2018-10-16 MED ORDER — PANTOPRAZOLE SODIUM 40 MG PO TBEC
40.0000 mg | DELAYED_RELEASE_TABLET | Freq: Two times a day (BID) | ORAL | Status: DC
  Administered 2018-10-16 – 2018-10-21 (×10): 40 mg via ORAL
  Filled 2018-10-16 (×8): qty 1
  Filled 2018-10-16: qty 2
  Filled 2018-10-16: qty 1

## 2018-10-16 MED ORDER — CARVEDILOL 25 MG PO TABS
25.0000 mg | ORAL_TABLET | Freq: Two times a day (BID) | ORAL | Status: DC
  Administered 2018-10-16 – 2018-10-21 (×11): 25 mg via ORAL
  Filled 2018-10-16 (×11): qty 1

## 2018-10-16 MED ORDER — INSULIN ASPART 100 UNIT/ML ~~LOC~~ SOLN
0.0000 [IU] | Freq: Every day | SUBCUTANEOUS | Status: DC
  Administered 2018-10-17: 2 [IU] via SUBCUTANEOUS
  Administered 2018-10-19 – 2018-10-20 (×2): 3 [IU] via SUBCUTANEOUS

## 2018-10-16 MED ORDER — INSULIN ASPART 100 UNIT/ML ~~LOC~~ SOLN
0.0000 [IU] | Freq: Three times a day (TID) | SUBCUTANEOUS | Status: DC
  Administered 2018-10-16 – 2018-10-17 (×5): 3 [IU] via SUBCUTANEOUS
  Administered 2018-10-18 (×2): 5 [IU] via SUBCUTANEOUS
  Administered 2018-10-18: 8 [IU] via SUBCUTANEOUS
  Administered 2018-10-19: 3 [IU] via SUBCUTANEOUS
  Administered 2018-10-19 – 2018-10-20 (×3): 5 [IU] via SUBCUTANEOUS
  Administered 2018-10-20 (×2): 3 [IU] via SUBCUTANEOUS
  Administered 2018-10-21: 2 [IU] via SUBCUTANEOUS
  Administered 2018-10-21: 5 [IU] via SUBCUTANEOUS

## 2018-10-16 MED ORDER — AZITHROMYCIN 250 MG PO TABS
500.0000 mg | ORAL_TABLET | Freq: Every day | ORAL | Status: DC
  Administered 2018-10-16 – 2018-10-21 (×6): 500 mg via ORAL
  Filled 2018-10-16 (×6): qty 2

## 2018-10-16 NOTE — Progress Notes (Signed)
[facility] Progress Note  Patient: Deborah Weeks WJX:[id number]   PCP: Bernerd Limbo, MD DOB: Jan 20, 1946   DOA: 10/14/2018   DOS: 10/16/2018   Date of Service: the patient was seen and examined on 10/16/2018  Brief hospital course: Pt. with PMH of HTN, HLD, lupus, DM type II; admitted on 10/14/2018, presented with complaint of shortness of breath, was found to have acute pneumonia with COPD exacerbation. Currently further plan is continue current management.  Subjective: Still has a breathing issue.  Still cough.  Reports that she has hemoptysis.  No chest pain no nausea no vomiting.  No diarrhea.  No dizziness no lightheadedness.  Unable to sleep last night.  Telemetry: A. fib with RVR  Assessment and Plan: Acute respiratory failure with hypoxia,  Sepsis POA Left lobar community-acquired pneumonia Mild hemoptysis.  Patient presents with 1 day of shortness of breath and cough.   Found to be febrile up to 103.2 F with tachypnea, WBC elevated 11.2, Chest x-ray showing a left mid to lower lung opacity concerning for pneumonia.   Influenza screens were negative.  Respiratory virus pathogen panel currently pending. - Needed BiPAP initially, continue to progressive bed - Follow-up blood, urine, and sputum cultures - Duonebs every 4 hours - Solu-Medrol 125 mg IV x 1, continue with IV Solu-Medrol twice daily - Hemoptysis is secondary to coughing.  Will provide cough suppressants. -Chest x-ray reports CHF. -Continue BiPAP PRN.  Acute on chronic diastolic CHF. We will give IV Lasix. Monitor.  Chest pain: Patient reports chest pain initial EKG shows no significant ischemic changes. - Negative cardiac troponin, currently resolved.  No further work-up.  Continue to monitor on telemetry for now.  Abnormal UA:   Patient noted to have large leukocytes and WBCs on urinalysis but no bacteria. - Follow-up urine culture - Covered with antibiotics as seen above  Hypokalemia:  Acute.   Initial potassium noted be 3.4 - Give 20 mEq of potassium chloride IV - Continue to monitor and replace as needed  Essential hypertension - Restart blood pressure medications when able  History of lupus:  Patient reports no recent flare.  Type 2 diabetes mellitus, uncontrolled with hyperglycemia. hold her oral hypoglycemic agents. On insulin sliding scale moderate 6.1 hemoglobin A1c.   Morbid obesity Body mass index is 32.41 kg/m.   Diet: regular diet DVT Prophylaxis: subcutaneous Heparin  Advance goals of care discussion: full code  Family Communication: family was present at bedside, at the time of interview. The pt provided permission to discuss medical plan with the family. Opportunity was given to ask question and all questions were answered satisfactorily.   Disposition:  Discharge to home.  Consultants: none Procedures: noen  Scheduled Meds: . azithromycin  500 mg Oral Daily  . benzonatate  100 mg Oral TID  . carvedilol  25 mg Oral BID  . chlorpheniramine-HYDROcodone  5 mL Oral Q12H  . colesevelam  625 mg Oral BID  . dextromethorphan  30 mg Oral BID  . guaiFENesin  600 mg Oral BID  . insulin aspart  0-15 Units Subcutaneous TID WC  . insulin aspart  0-5 Units Subcutaneous QHS  . ipratropium-albuterol  3 mL Nebulization TID  . methylPREDNISolone (SOLU-MEDROL) injection  60 mg Intravenous BID  . pantoprazole  40 mg Oral BID  . pregabalin  75 mg Oral TID  . sodium chloride flush  3 mL Intravenous Q12H   Continuous Infusions: . cefTRIAXone (ROCEPHIN)  IV Stopped (10/16/18 0705)   PRN Meds: acetaminophen **OR** acetaminophen,  guaiFENesin, metoprolol tartrate, ondansetron **OR** ondansetron (ZOFRAN) IV, RESOURCE THICKENUP CLEAR Antibiotics: Anti-infectives (From admission, onward)   Start     Dose/Rate Route Frequency Ordered Stop   10/16/18 1030  azithromycin (ZITHROMAX) tablet 500 mg     500 mg Oral Daily 10/16/18 1015     10/14/18 1915  cefTRIAXone  (ROCEPHIN) 2 g in sodium chloride 0.9 % 100 mL IVPB     2 g 200 mL/hr over 30 Minutes Intravenous Every 24 hours 10/14/18 1914     10/14/18 1915  azithromycin (ZITHROMAX) 500 mg in sodium chloride 0.9 % 250 mL IVPB  Status:  Discontinued     500 mg 250 mL/hr over 60 Minutes Intravenous Every 24 hours 10/14/18 1914 10/16/18 1015       Objective: Physical Exam: Vitals:   10/16/18 0905 10/16/18 1217 10/16/18 1222 10/16/18 1700  BP: (!) 151/77  (!) 152/80 127/76  Pulse: (!) 107 (!) 105 89 (!) 108  Resp: (!) 22 (!) 22 (!) 21 17  Temp: 99.2 F (37.3 C)  98.5 F (36.9 C) 98.7 F (37.1 C)  TempSrc: Oral  Oral Oral  SpO2: 93%  91% 92%  Weight:      Height:        Intake/Output Summary (Last 24 hours) at 10/16/2018 1843 Last data filed at 10/16/2018 1600 Gross per 24 hour  Intake 2082.06 ml  Output 700 ml  Net 1382.06 ml   Filed Weights   10/14/18 1852  Weight: 95.3 kg   General: Alert, Awake and Oriented to Time, Place and Person. Appear in moderate distress, affect appropriate Eyes: PERRL, Conjunctiva normal ENT: Oral Mucosa clear moist. Neck: no JVD, no Abnormal Mass Or lumps Cardiovascular: S1 and S2 Present, no Murmur, Peripheral Pulses Present Respiratory: normal respiratory effort, Bilateral Air entry equal and Decreased, no use of accessory muscle, bilateral  Crackles, bilateral  wheezes Abdomen: Bowel Sound present, Soft and no tenderness, no hernia Skin: no redness, no Rash, no induration Extremities: no Pedal edema, no calf tenderness Neurologic: Grossly no focal neuro deficit. Bilaterally Equal motor strength  Data Reviewed: CBC: Recent Labs  Lab 10/14/18 1927 10/15/18 0730 10/16/18 0220  WBC 11.2* 11.0* 11.5*  NEUTROABS 9.7*  --   --   HGB 12.4 12.3 10.8*  HCT 39.2 39.8 34.7*  MCV 80.7 79.9* 79.4*  PLT 283 265 196   Basic Metabolic Panel: Recent Labs  Lab 10/14/18 1927 10/15/18 0730 10/16/18 0220  NA 133* 138 140  K 3.4* 3.8 3.8  CL 100 107 108    CO2 22 20* 22  GLUCOSE 180* 215* 202*  BUN 6* 9 15  CREATININE 0.93 0.83 0.82  CALCIUM 9.1 8.7* 8.5*  MG  --   --  1.9    Liver Function Tests: Recent Labs  Lab 10/14/18 1927  AST 17  ALT 15  ALKPHOS 47  BILITOT 1.1  PROT 7.5  ALBUMIN 3.8   No results for input(s): LIPASE, AMYLASE in the last 168 hours. No results for input(s): AMMONIA in the last 168 hours. Coagulation Profile: No results for input(s): INR, PROTIME in the last 168 hours. Cardiac Enzymes: Recent Labs  Lab 10/15/18 0212 10/15/18 0730 10/15/18 1408  TROPONINI <0.03 <0.03 <0.03   BNP (last 3 results) No results for input(s): PROBNP in the last 8760 hours. CBG: Recent Labs  Lab 10/15/18 1259 10/15/18 1644 10/16/18 0035 10/16/18 0636 10/16/18 1206  GLUCAP 201* 233* 215* 194* 157*   Studies: Dg Chest 2 View  Result Date: 10/16/2018 CLINICAL DATA:  Shortness of breath and cough.  Pneumonia EXAM: CHEST - 2 VIEW COMPARISON:  Two days ago FINDINGS: Diffuse interstitial opacity with Kerley lines. Small pleural effusions. Normal heart size and mediastinal contours. IMPRESSION: CHF pattern. There is provided history of pneumonia and atypical or superimposed infection is not excluded. Electronically Signed   By: Monte Fantasia M.D.   On: 10/16/2018 07:22   Dg Esophagus Inc Scout Chest & Delayed Img Single Cm (ba Or Sol)  Result Date: 10/16/2018 CLINICAL DATA:  Cough, reflux esophagitis, aspiration pneumonia EXAM: ESOPHOGRAM/BARIUM SWALLOW TECHNIQUE: Combined double contrast and single contrast examination performed using effervescent crystals, thick barium liquid, and thin barium liquid. FLUOROSCOPY TIME:  Fluoroscopy Time:  1 minutes 12 seconds Radiation Exposure Index (if provided by the fluoroscopic device): Number of Acquired Spot Images: 0 COMPARISON:  None. FINDINGS: Fluoroscopic evaluation of swallowing demonstrates no laryngeal penetration or aspiration. No fixed stricture, fold thickening or mass. No  reflux with the water siphon maneuver. There is disruption of all primary esophageal peristaltic waves. IMPRESSION: Esophageal dysmotility. No fixed stricture. No reflux or aspiration noted. Electronically Signed   By: Rolm Baptise M.D.   On: 10/16/2018 08:28     Time spent: 35 minutes  Author: Berle Mull, MD [facility] Pager: [phone] 10/16/2018 6:43 PM  Between 7PM-7AM, please contact night-coverage at [email or web address], password [facility]

## 2018-10-17 ENCOUNTER — Inpatient Hospital Stay (HOSPITAL_COMMUNITY): Payer: Medicare Other

## 2018-10-17 DIAGNOSIS — R0602 Shortness of breath: Secondary | ICD-10-CM

## 2018-10-17 LAB — BASIC METABOLIC PANEL
Anion gap: 11 (ref 5–15)
BUN: 23 mg/dL (ref 8–23)
CO2: 25 mmol/L (ref 22–32)
Calcium: 8.5 mg/dL — ABNORMAL LOW (ref 8.9–10.3)
Chloride: 103 mmol/L (ref 98–111)
Creatinine, Ser: 0.93 mg/dL (ref 0.44–1.00)
GFR calc Af Amer: >60 mL/min (ref >60)
GFR calc non Af Amer: >60 mL/min (ref >60)
Glucose, Bld: 204 mg/dL — ABNORMAL HIGH (ref 70–99)
Potassium: 4.2 mmol/L (ref 3.5–5.1)
Sodium: 139 mmol/L (ref 135–145)

## 2018-10-17 LAB — ECHOCARDIOGRAM LIMITED
Height: 67.5 in
Weight: 3360 oz

## 2018-10-17 LAB — CBC
HCT: 35.2 % — ABNORMAL LOW (ref 36.0–46.0)
Hemoglobin: 10.9 g/dL — ABNORMAL LOW (ref 12.0–15.0)
MCH: 24.9 pg — ABNORMAL LOW (ref 26.0–34.0)
MCHC: 31 g/dL (ref 30.0–36.0)
MCV: 80.4 fL (ref 80.0–100.0)
Platelets: 262 10*3/uL (ref 150–400)
RBC: 4.38 MIL/uL (ref 3.87–5.11)
RDW: 14.2 % (ref 11.5–15.5)
WBC: 9.4 10*3/uL (ref 4.0–10.5)
nRBC: 0 % (ref 0.0–0.2)

## 2018-10-17 LAB — GLUCOSE, CAPILLARY
Glucose-Capillary: 183 mg/dL — ABNORMAL HIGH (ref 70–99)
Glucose-Capillary: 193 mg/dL — ABNORMAL HIGH (ref 70–99)
Glucose-Capillary: 213 mg/dL — ABNORMAL HIGH (ref 70–99)
Glucose-Capillary: 153 mg/dL — ABNORMAL HIGH (ref 70–99)
Glucose-Capillary: 206 mg/dL — ABNORMAL HIGH (ref 70–99)

## 2018-10-17 MED ORDER — MIRTAZAPINE 15 MG PO TABS
7.5000 mg | ORAL_TABLET | Freq: Every day | ORAL | Status: DC
  Administered 2018-10-17 – 2018-10-20 (×4): 7.5 mg via ORAL
  Filled 2018-10-17 (×4): qty 1

## 2018-10-17 MED ORDER — LEVALBUTEROL HCL 0.63 MG/3ML IN NEBU
0.6300 mg | INHALATION_SOLUTION | Freq: Three times a day (TID) | RESPIRATORY_TRACT | Status: DC
  Administered 2018-10-17 – 2018-10-18 (×2): 0.63 mg via RESPIRATORY_TRACT
  Filled 2018-10-17 (×2): qty 3

## 2018-10-17 MED ORDER — PRAVASTATIN SODIUM 10 MG PO TABS
10.0000 mg | ORAL_TABLET | Freq: Every day | ORAL | Status: DC
  Administered 2018-10-17 – 2018-10-21 (×5): 10 mg via ORAL
  Filled 2018-10-17 (×5): qty 1

## 2018-10-17 MED ORDER — FUROSEMIDE 10 MG/ML IJ SOLN
20.0000 mg | Freq: Once | INTRAMUSCULAR | Status: AC
  Administered 2018-10-17: 20 mg via INTRAVENOUS
  Filled 2018-10-17: qty 2

## 2018-10-17 MED ORDER — IPRATROPIUM BROMIDE 0.02 % IN SOLN
0.5000 mg | Freq: Three times a day (TID) | RESPIRATORY_TRACT | Status: DC
  Administered 2018-10-17 – 2018-10-18 (×2): 0.5 mg via RESPIRATORY_TRACT
  Filled 2018-10-17 (×2): qty 2.5

## 2018-10-17 MED ORDER — PERFLUTREN LIPID MICROSPHERE
INTRAVENOUS | Status: AC
  Administered 2018-10-17: 3 mL
  Filled 2018-10-17: qty 10

## 2018-10-17 MED ORDER — ESCITALOPRAM OXALATE 20 MG PO TABS
20.0000 mg | ORAL_TABLET | Freq: Every day | ORAL | Status: DC
  Administered 2018-10-17 – 2018-10-21 (×5): 20 mg via ORAL
  Filled 2018-10-17 (×5): qty 1

## 2018-10-17 NOTE — Progress Notes (Signed)
  Echocardiogram 2D Echocardiogram has been performed.  Deborah Weeks 10/17/2018, 4:13 PM

## 2018-10-17 NOTE — Progress Notes (Signed)
[facility] Progress Note  Patient: Deborah Weeks OEV:[id number]   PCP: Bernerd Limbo, MD DOB: 1945/11/08   DOA: 10/14/2018   DOS: 10/17/2018   Date of Service: the patient was seen and examined on 10/17/2018  Brief hospital course: Pt. with PMH of HTN, HLD, lupus, DM type II; admitted on 10/14/2018, presented with complaint of shortness of breath, was found to have acute pneumonia with COPD exacerbation. Currently further plan is continue current management.  Subjective: Still reports mild blood in the sputum.  No nausea no vomiting.  No fever no chills.  Breathing is still heavy.  Unable to sleep last night secondary to cough.  No diarrhea.  Actually constipated.  Telemetry: Now sinus rhythm, occasional SVT.  Assessment and Plan: Acute respiratory failure with hypoxia,  Sepsis POA Left lobar community-acquired pneumonia Mild hemoptysis.  Patient presents with 1 day of shortness of breath and cough.   Found to be febrile up to 103.2 F with tachypnea, WBC elevated 11.2, Chest x-ray showing a left mid to lower lung opacity concerning for pneumonia.   Influenza screens were negative.  Respiratory virus pathogen panel currently pending. - Needed BiPAP initially, continue to progressive bed - Follow-up blood, urine, and sputum cultures - Duonebs every 4 hours - Solu-Medrol 125 mg IV x 1, continue with IV Solu-Medrol twice daily - Hemoptysis is secondary to coughing.  Will provide cough suppressants. -Chest x-ray reports CHF. -Continue BiPAP PRN. -Holding aspirin and Lovenox for DVT prophylaxis secondary to hemoptysis.  Acute on chronic diastolic CHF. Patient received IV Lasix x1 for last 2 days.  Reassess tomorrow and give more IV diuresis as the patient is actually responding to it. A limited echocardiogram was requested to evaluate the EF, which is still stable.  No acute abnormality. Monitor.  Paroxysmal A. fib with RVR. Occasional SVTs. Currently sinus rhythm. Continue Coreg  25 mg twice daily. This was likely in the response to her respiratory illness. Currently holding off on anticoagulation due to hemoptysis. Continue 81 mg aspirin on discharge.  Chest pain: Patient reports chest pain initial EKG shows no significant ischemic changes. - Negative cardiac troponin, currently resolved.  No further work-up.  Continue to monitor on telemetry for now.  Abnormal UA:   Patient noted to have large leukocytes and WBCs on urinalysis but no bacteria. - Follow-up urine culture - Covered with antibiotics as seen above  Hypokalemia:  Better.  Monitor.  Essential hypertension - Restart blood pressure medications when able  History of lupus:  Patient reports no recent flare.  Type 2 diabetes mellitus, uncontrolled with hyperglycemia. hold her oral hypoglycemic agents. On insulin sliding scale moderate 6.1 hemoglobin A1c.   Morbid obesity Body mass index is 32.41 kg/m.   Diet: regular diet DVT Prophylaxis: subcutaneous Heparin  Advance goals of care discussion: full code  Family Communication: family was present at bedside, at the time of interview. The pt provided permission to discuss medical plan with the family. Opportunity was given to ask question and all questions were answered satisfactorily.   Disposition:  Discharge to home.  Consultants: none Procedures: noen  Scheduled Meds: . azithromycin  500 mg Oral Daily  . benzonatate  100 mg Oral TID  . carvedilol  25 mg Oral BID  . chlorpheniramine-HYDROcodone  5 mL Oral Q12H  . dextromethorphan  30 mg Oral BID  . escitalopram  20 mg Oral Daily  . guaiFENesin  600 mg Oral BID  . insulin aspart  0-15 Units Subcutaneous TID WC  .  insulin aspart  0-5 Units Subcutaneous QHS  . ipratropium  0.5 mg Nebulization Q8H  . levalbuterol  0.63 mg Nebulization Q8H  . methylPREDNISolone (SOLU-MEDROL) injection  60 mg Intravenous BID  . mirtazapine  7.5 mg Oral QHS  . pantoprazole  40 mg Oral BID  .  pravastatin  10 mg Oral Daily  . pregabalin  75 mg Oral TID  . sodium chloride flush  3 mL Intravenous Q12H   Continuous Infusions: . cefTRIAXone (ROCEPHIN)  IV 2 g (10/16/18 2338)   PRN Meds: acetaminophen **OR** acetaminophen, guaiFENesin, metoprolol tartrate, ondansetron **OR** ondansetron (ZOFRAN) IV, RESOURCE THICKENUP CLEAR Antibiotics: Anti-infectives (From admission, onward)   Start     Dose/Rate Route Frequency Ordered Stop   10/16/18 1030  azithromycin (ZITHROMAX) tablet 500 mg     500 mg Oral Daily 10/16/18 1015     10/14/18 1915  cefTRIAXone (ROCEPHIN) 2 g in sodium chloride 0.9 % 100 mL IVPB     2 g 200 mL/hr over 30 Minutes Intravenous Every 24 hours 10/14/18 1914     10/14/18 1915  azithromycin (ZITHROMAX) 500 mg in sodium chloride 0.9 % 250 mL IVPB  Status:  Discontinued     500 mg 250 mL/hr over 60 Minutes Intravenous Every 24 hours 10/14/18 1914 10/16/18 1015       Objective: Physical Exam: Vitals:   10/17/18 0834 10/17/18 1343 10/17/18 1421 10/17/18 1658  BP:   (!) 141/75 (!) 144/75  Pulse:   67 70  Resp:   17 18  Temp:   98.9 F (37.2 C) 98.7 F (37.1 C)  TempSrc:   Oral Oral  SpO2: 95% 97% 94% 91%  Weight:      Height:        Intake/Output Summary (Last 24 hours) at 10/17/2018 1707 Last data filed at 10/17/2018 1204 Gross per 24 hour  Intake 882 ml  Output -  Net 882 ml   Filed Weights   10/14/18 1852  Weight: 95.3 kg   General: Alert, Awake and Oriented to Time, Place and Person. Appear in moderate distress, affect appropriate Eyes: PERRL, Conjunctiva normal ENT: Oral Mucosa clear moist. Neck: no JVD, no Abnormal Mass Or lumps Cardiovascular: S1 and S2 Present, no Murmur, Peripheral Pulses Present Respiratory: normal respiratory effort, Bilateral Air entry equal and Decreased, no use of accessory muscle, bilateral  Crackles, bilateral  wheezes Abdomen: Bowel Sound present, Soft and no tenderness, no hernia Skin: no redness, no Rash, no  induration Extremities: no Pedal edema, no calf tenderness Neurologic: Grossly no focal neuro deficit. Bilaterally Equal motor strength  Data Reviewed: CBC: Recent Labs  Lab 10/14/18 1927 10/15/18 0730 10/16/18 0220 10/17/18 0403  WBC 11.2* 11.0* 11.5* 9.4  NEUTROABS 9.7*  --   --   --   HGB 12.4 12.3 10.8* 10.9*  HCT 39.2 39.8 34.7* 35.2*  MCV 80.7 79.9* 79.4* 80.4  PLT 283 265 250 856   Basic Metabolic Panel: Recent Labs  Lab 10/14/18 1927 10/15/18 0730 10/16/18 0220 10/17/18 0403  NA 133* 138 140 139  K 3.4* 3.8 3.8 4.2  CL 100 107 108 103  CO2 22 20* 22 25  GLUCOSE 180* 215* 202* 204*  BUN 6* 9 15 23   CREATININE 0.93 0.83 0.82 0.93  CALCIUM 9.1 8.7* 8.5* 8.5*  MG  --   --  1.9  --     Liver Function Tests: Recent Labs  Lab 10/14/18 1927  AST 17  ALT 15  ALKPHOS 47  BILITOT 1.1  PROT 7.5  ALBUMIN 3.8   No results for input(s): LIPASE, AMYLASE in the last 168 hours. No results for input(s): AMMONIA in the last 168 hours. Coagulation Profile: No results for input(s): INR, PROTIME in the last 168 hours. Cardiac Enzymes: Recent Labs  Lab 10/15/18 0212 10/15/18 0730 10/15/18 1408  TROPONINI <0.03 <0.03 <0.03   BNP (last 3 results) No results for input(s): PROBNP in the last 8760 hours. CBG: Recent Labs  Lab 10/16/18 1655 10/16/18 2113 10/17/18 0724 10/17/18 1212 10/17/18 1650  GLUCAP 183* 198* 193* 213* 153*   Studies: No results found.   Time spent: 35 minutes  Author: Berle Mull, MD [facility] Pager: [phone] 10/17/2018 5:07 PM  Between 7PM-7AM, please contact night-coverage at [email or web address], password [facility]

## 2018-10-17 NOTE — Care Management Important Message (Signed)
Important Message  Patient Details  Name: Deborah Weeks MRN: [id number] Date of Birth: 1946/09/21   Medicare Important Message Given:  Yes    [name] 10/17/2018, 3:28 PM

## 2018-10-17 NOTE — Progress Notes (Signed)
Definity pulled from pyxis and administered by echo tech.

## 2018-10-18 LAB — CBC
HCT: 34.6 % — ABNORMAL LOW (ref 36.0–46.0)
Hemoglobin: 11.1 g/dL — ABNORMAL LOW (ref 12.0–15.0)
MCH: 25.9 pg — ABNORMAL LOW (ref 26.0–34.0)
MCHC: 32.1 g/dL (ref 30.0–36.0)
MCV: 80.7 fL (ref 80.0–100.0)
Platelets: 303 10*3/uL (ref 150–400)
RBC: 4.29 MIL/uL (ref 3.87–5.11)
RDW: 14.1 % (ref 11.5–15.5)
WBC: 8.3 10*3/uL (ref 4.0–10.5)
nRBC: 0 % (ref 0.0–0.2)

## 2018-10-18 LAB — BASIC METABOLIC PANEL
Anion gap: 9 (ref 5–15)
BUN: 32 mg/dL — ABNORMAL HIGH (ref 8–23)
CO2: 27 mmol/L (ref 22–32)
Calcium: 8.4 mg/dL — ABNORMAL LOW (ref 8.9–10.3)
Chloride: 99 mmol/L (ref 98–111)
Creatinine, Ser: 1.08 mg/dL — ABNORMAL HIGH (ref 0.44–1.00)
GFR calc Af Amer: 59 mL/min — ABNORMAL LOW (ref >60)
GFR calc non Af Amer: 51 mL/min — ABNORMAL LOW (ref >60)
Glucose, Bld: 213 mg/dL — ABNORMAL HIGH (ref 70–99)
Potassium: 4.2 mmol/L (ref 3.5–5.1)
Sodium: 135 mmol/L (ref 135–145)

## 2018-10-18 LAB — BRAIN NATRIURETIC PEPTIDE: B Natriuretic Peptide: 332.5 pg/mL — ABNORMAL HIGH (ref 0.0–100.0)

## 2018-10-18 LAB — GLUCOSE, CAPILLARY
Glucose-Capillary: 226 mg/dL — ABNORMAL HIGH (ref 70–99)
Glucose-Capillary: 243 mg/dL — ABNORMAL HIGH (ref 70–99)
Glucose-Capillary: 344 mg/dL — ABNORMAL HIGH (ref 70–99)
Glucose-Capillary: 272 mg/dL — ABNORMAL HIGH (ref 70–99)
Glucose-Capillary: 160 mg/dL — ABNORMAL HIGH (ref 70–99)

## 2018-10-18 MED ORDER — LEVALBUTEROL HCL 0.63 MG/3ML IN NEBU
0.6300 mg | INHALATION_SOLUTION | Freq: Three times a day (TID) | RESPIRATORY_TRACT | Status: DC
  Administered 2018-10-18 – 2018-10-21 (×9): 0.63 mg via RESPIRATORY_TRACT
  Filled 2018-10-18 (×12): qty 3

## 2018-10-18 MED ORDER — IPRATROPIUM BROMIDE 0.02 % IN SOLN
0.5000 mg | Freq: Three times a day (TID) | RESPIRATORY_TRACT | Status: DC
  Administered 2018-10-18 – 2018-10-21 (×9): 0.5 mg via RESPIRATORY_TRACT
  Filled 2018-10-18 (×11): qty 2.5

## 2018-10-18 MED ORDER — INSULIN GLARGINE 100 UNIT/ML ~~LOC~~ SOLN
10.0000 [IU] | Freq: Every day | SUBCUTANEOUS | Status: DC
  Administered 2018-10-18: 10 [IU] via SUBCUTANEOUS
  Filled 2018-10-18: qty 0.1

## 2018-10-18 MED ORDER — INSULIN ASPART 100 UNIT/ML ~~LOC~~ SOLN
2.0000 [IU] | Freq: Three times a day (TID) | SUBCUTANEOUS | Status: DC
  Administered 2018-10-18 – 2018-10-21 (×9): 2 [IU] via SUBCUTANEOUS

## 2018-10-18 MED ORDER — FUROSEMIDE 20 MG PO TABS
20.0000 mg | ORAL_TABLET | Freq: Two times a day (BID) | ORAL | Status: DC
  Administered 2018-10-18 – 2018-10-21 (×6): 20 mg via ORAL
  Filled 2018-10-18 (×7): qty 1

## 2018-10-18 NOTE — Progress Notes (Signed)
[facility] Progress Note  Patient: Deborah Weeks OEC:[id number]   PCP: Bernerd Limbo, MD DOB: 06-26-1946   DOA: 10/14/2018   DOS: 10/18/2018   Date of Service: the patient was seen and examined on 10/18/2018  Brief hospital course: Pt. with PMH of HTN, HLD, lupus, DM type II; admitted on 10/14/2018, presented with complaint of shortness of breath, was found to have acute pneumonia with COPD exacerbation. -Improving  Subjective: Improving, blood and sputum is better today cough and congestion overall getting better, not back to baseline yet  Telemetry: Now sinus rhythm, occasional SVT.  Assessment and Plan:  Sepsis/acute respiratory failure with hypoxia  -Due to lobar pneumonia associated with hemoptysis  -He was febrile to 103, tachypneic tachycardic in distress on admission, required BiPAP on admission, now off -Clinically improving now was also wheezing earlier this admission  -Influenza PCR was negative, respiratory virus panel is pending  -Continue IV Solu-Medrol and IV ceftriaxone today -We will consider transitioning to oral antibiotics and steroids in the next 24 to 48 hours pending clinical improvement -Aspirin and Lovenox for DVT prophylaxis temporarily held due to hemoptysis  Acute on chronic diastolic CHF -Diuresed with IV Lasix initially -Clinically appears euvolemic now, start oral diuretics today -Monitor be met, urine output  Paroxysmal A. fib with RVR. -Improving, rate controlled at this time, continue Coreg 25 mg twice daily -Held off on starting anticoagulation due to ongoing hemoptysis this admission, advised patient and daughter to have her follow-up with cardiology quickly in few weeks and starting Eliquis then  Abnormal UA:   Patient noted to have large leukocytes and WBCs on urinalysis but no bacteria. - Follow-up urine culture - Covered with antibiotics as seen above  Hypokalemia:  Better.  Monitor.  Essential hypertension - Restart blood pressure  medications when able  History of lupus:  Patient reports no recent flare.  Type 2 diabetes mellitus, uncontrolled with hyperglycemia. hold her oral hypoglycemic agents. On insulin sliding scale moderate 6.1 hemoglobin A1c.   Morbid obesity Body mass index is 32.41 kg/m.   Diet: regular diet DVT Prophylaxis: subcutaneous Heparin  Advance goals of care discussion: full code  Family Communication: At bedside  Disposition: Home in 1 to 2 days  Consultants: none Procedures: noen  Scheduled Meds: . azithromycin  500 mg Oral Daily  . benzonatate  100 mg Oral TID  . carvedilol  25 mg Oral BID  . escitalopram  20 mg Oral Daily  . furosemide  20 mg Oral BID  . guaiFENesin  600 mg Oral BID  . insulin aspart  0-15 Units Subcutaneous TID WC  . insulin aspart  0-5 Units Subcutaneous QHS  . insulin aspart  2 Units Subcutaneous TID WC  . insulin glargine  10 Units Subcutaneous QHS  . ipratropium  0.5 mg Nebulization TID  . levalbuterol  0.63 mg Nebulization TID  . methylPREDNISolone (SOLU-MEDROL) injection  60 mg Intravenous BID  . mirtazapine  7.5 mg Oral QHS  . pantoprazole  40 mg Oral BID  . pravastatin  10 mg Oral Daily  . pregabalin  75 mg Oral TID  . sodium chloride flush  3 mL Intravenous Q12H   Continuous Infusions: . cefTRIAXone (ROCEPHIN)  IV 2 g (10/17/18 2320)   PRN Meds: acetaminophen **OR** acetaminophen, metoprolol tartrate, ondansetron **OR** ondansetron (ZOFRAN) IV, RESOURCE THICKENUP CLEAR Antibiotics: Anti-infectives (From admission, onward)   Start     Dose/Rate Route Frequency Ordered Stop   10/16/18 1030  azithromycin (ZITHROMAX) tablet 500 mg  500 mg Oral Daily 10/16/18 1015     10/14/18 1915  cefTRIAXone (ROCEPHIN) 2 g in sodium chloride 0.9 % 100 mL IVPB     2 g 200 mL/hr over 30 Minutes Intravenous Every 24 hours 10/14/18 1914     10/14/18 1915  azithromycin (ZITHROMAX) 500 mg in sodium chloride 0.9 % 250 mL IVPB  Status:  Discontinued      500 mg 250 mL/hr over 60 Minutes Intravenous Every 24 hours 10/14/18 1914 10/16/18 1015       Objective: Physical Exam: Vitals:   10/18/18 0000 10/18/18 0342 10/18/18 0734 10/18/18 0925  BP: 115/63 (!) 141/58 (!) 146/61   Pulse: 76 74 70   Resp: (!) 28 18 17    Temp:  98.6 F (37 C) 98.5 F (36.9 C)   TempSrc:  Oral Oral   SpO2: (!) 89% (!) 89% 91% 97%  Weight:      Height:       No intake or output data in the 24 hours ending 10/18/18 1444 Filed Weights   10/14/18 1852  Weight: 95.3 kg   Gen: Awake, Alert, Oriented X 3,  HEENT: PERRLA, Neck supple, no JVD Lungs: Poor air movement bilaterally, few scattered rhonchi CVS: RRR,No Gallops,Rubs or new Murmurs Abd: soft, Non tender, non distended, BS present Extremities: Trace edema Skin: no new rashes Neurologic: Grossly no focal neuro deficit. Bilaterally Equal motor strength  Data Reviewed: CBC: Recent Labs  Lab 10/14/18 1927 10/15/18 0730 10/16/18 0220 10/17/18 0403 10/18/18 0235  WBC 11.2* 11.0* 11.5* 9.4 8.3  NEUTROABS 9.7*  --   --   --   --   HGB 12.4 12.3 10.8* 10.9* 11.1*  HCT 39.2 39.8 34.7* 35.2* 34.6*  MCV 80.7 79.9* 79.4* 80.4 80.7  PLT 283 265 250 262 381   Basic Metabolic Panel: Recent Labs  Lab 10/14/18 1927 10/15/18 0730 10/16/18 0220 10/17/18 0403 10/18/18 0235  NA 133* 138 140 139 135  K 3.4* 3.8 3.8 4.2 4.2  CL 100 107 108 103 99  CO2 22 20* 22 25 27   GLUCOSE 180* 215* 202* 204* 213*  BUN 6* 9 15 23  32*  CREATININE 0.93 0.83 0.82 0.93 1.08*  CALCIUM 9.1 8.7* 8.5* 8.5* 8.4*  MG  --   --  1.9  --   --     Liver Function Tests: Recent Labs  Lab 10/14/18 1927  AST 17  ALT 15  ALKPHOS 47  BILITOT 1.1  PROT 7.5  ALBUMIN 3.8   No results for input(s): LIPASE, AMYLASE in the last 168 hours. No results for input(s): AMMONIA in the last 168 hours. Coagulation Profile: No results for input(s): INR, PROTIME in the last 168 hours. Cardiac Enzymes: Recent Labs  Lab 10/15/18 0212  10/15/18 0730 10/15/18 1408  TROPONINI <0.03 <0.03 <0.03   BNP (last 3 results) No results for input(s): PROBNP in the last 8760 hours. CBG: Recent Labs  Lab 10/17/18 1212 10/17/18 1650 10/17/18 2118 10/18/18 0733 10/18/18 1159  GLUCAP 213* 153* 206* 226* 243*   Studies: No results found.   Time spent: 35 minutes  Author: Domenic Polite, MD  10/18/2018 2:44 PM

## 2018-10-18 NOTE — Progress Notes (Signed)
Inpatient Diabetes Program Recommendations  AACE/ADA: New Consensus Statement on Inpatient Glycemic Control (2015)  Target Ranges:  Prepandial:   less than 140 mg/dL      Peak postprandial:   less than 180 mg/dL (1-2 hours)      Critically ill patients:  140 - 180 mg/dL   Lab Results  Component Value Date   GLUCAP 243 (H) 10/18/2018   HGBA1C 6.1 (H) 10/16/2018     Results for VELINA, DROLLINGER (MRN [id number]) as of 10/18/2018 14:14  Ref. Range 10/17/2018 07:24 10/17/2018 12:12 10/17/2018 16:50 10/17/2018 21:18 10/18/2018 07:33 10/18/2018 11:59  Glucose-Capillary Latest Ref Range: 70 - 99 mg/dL 193 (H)  Novolog 3 units 213 (H)  Novolog 3 units  153 (H)  Novolog 3 units 206 (H)  Novolog 2 units 226 (H)  Novolog 5 units  243 (H)  Novolog 5 units    DM2  Home DM meds: Glucophage 500 mg BID  Current inpatient DM meds: Novolog moderate scale (0-15 units) tid and (0-5 units) hs   Solumedrol 60 mg BID. As steroid dose is decreased the need for insulin likely will be reduced.    Note CBG still elevated. Patient has required 21 units of Novolog correction in the past 24 hours.  Recommend adding basal insulin and Novolog meal coverage. Checked with bedside RN and patient is eating.    MD please consider following inpatient insulin adjustments:  1. Add Lantus 10 units q hs (.1 units/kg)  2. Add Novolog 2 units meal coverage tid wc (Hold if NPO or if patient eats less than 50% of meal.)    Thank you.  -- Will follow during hospitalization.--  Jonna Clark RN, MSN Diabetes Coordinator Inpatient Glycemic Control Team Team Pager: [phone] (8am-5pm)

## 2018-10-19 LAB — CBC
HCT: 39.1 % (ref 36.0–46.0)
Hemoglobin: 12.2 g/dL (ref 12.0–15.0)
MCH: 24.5 pg — ABNORMAL LOW (ref 26.0–34.0)
MCHC: 31.2 g/dL (ref 30.0–36.0)
MCV: 78.7 fL — ABNORMAL LOW (ref 80.0–100.0)
Platelets: 363 10*3/uL (ref 150–400)
RBC: 4.97 MIL/uL (ref 3.87–5.11)
RDW: 13.6 % (ref 11.5–15.5)
WBC: 10.6 10*3/uL — ABNORMAL HIGH (ref 4.0–10.5)
nRBC: 0.2 % (ref 0.0–0.2)

## 2018-10-19 LAB — CULTURE, BLOOD (ROUTINE X 2)
Culture: NO GROWTH
Culture: NO GROWTH
Special Requests: ADEQUATE
Special Requests: ADEQUATE

## 2018-10-19 LAB — BASIC METABOLIC PANEL
Anion gap: 11 (ref 5–15)
BUN: 33 mg/dL — ABNORMAL HIGH (ref 8–23)
CO2: 25 mmol/L (ref 22–32)
Calcium: 8.6 mg/dL — ABNORMAL LOW (ref 8.9–10.3)
Chloride: 100 mmol/L (ref 98–111)
Creatinine, Ser: 1.1 mg/dL — ABNORMAL HIGH (ref 0.44–1.00)
GFR calc Af Amer: 58 mL/min — ABNORMAL LOW (ref >60)
GFR calc non Af Amer: 50 mL/min — ABNORMAL LOW (ref >60)
Glucose, Bld: 191 mg/dL — ABNORMAL HIGH (ref 70–99)
Potassium: 4 mmol/L (ref 3.5–5.1)
Sodium: 136 mmol/L (ref 135–145)

## 2018-10-19 LAB — GLUCOSE, CAPILLARY
Glucose-Capillary: 237 mg/dL — ABNORMAL HIGH (ref 70–99)
Glucose-Capillary: 245 mg/dL — ABNORMAL HIGH (ref 70–99)
Glucose-Capillary: 198 mg/dL — ABNORMAL HIGH (ref 70–99)
Glucose-Capillary: 280 mg/dL — ABNORMAL HIGH (ref 70–99)

## 2018-10-19 MED ORDER — SENNOSIDES-DOCUSATE SODIUM 8.6-50 MG PO TABS
1.0000 | ORAL_TABLET | Freq: Two times a day (BID) | ORAL | Status: DC
  Administered 2018-10-19 – 2018-10-21 (×5): 1 via ORAL
  Filled 2018-10-19 (×5): qty 1

## 2018-10-19 MED ORDER — INSULIN GLARGINE 100 UNIT/ML ~~LOC~~ SOLN
15.0000 [IU] | Freq: Every day | SUBCUTANEOUS | Status: DC
  Administered 2018-10-19 – 2018-10-20 (×2): 15 [IU] via SUBCUTANEOUS
  Filled 2018-10-19 (×3): qty 0.15

## 2018-10-19 MED ORDER — METHYLPREDNISOLONE SODIUM SUCC 40 MG IJ SOLR
40.0000 mg | Freq: Two times a day (BID) | INTRAMUSCULAR | Status: AC
  Administered 2018-10-19: 40 mg via INTRAVENOUS
  Filled 2018-10-19: qty 1

## 2018-10-19 MED ORDER — PREDNISONE 20 MG PO TABS
20.0000 mg | ORAL_TABLET | Freq: Every day | ORAL | Status: DC
  Administered 2018-10-20 – 2018-10-21 (×2): 20 mg via ORAL
  Filled 2018-10-19 (×2): qty 1
  Filled 2018-10-19: qty 2

## 2018-10-19 MED ORDER — CEFDINIR 300 MG PO CAPS
300.0000 mg | ORAL_CAPSULE | Freq: Two times a day (BID) | ORAL | Status: DC
  Administered 2018-10-19 – 2018-10-21 (×5): 300 mg via ORAL
  Filled 2018-10-19 (×6): qty 1

## 2018-10-19 NOTE — Progress Notes (Signed)
[facility] Progress Note  Patient: Deborah Weeks HYI:[id number]   PCP: Bernerd Limbo, MD DOB: [birth year]   DOA: 10/14/2018   DOS: 10/19/2018   Date of Service: the patient was seen and examined on 10/19/2018  Brief hospital course: Pt. with PMH of HTN, HLD, lupus, DM type II; admitted on 10/14/2018, presented with complaint of shortness of breath, was found to have acute pneumonia with COPD exacerbation. -Improving  Subjective: -Breathing better  Assessment and Plan:  Sepsis/acute respiratory failure with hypoxia  -Due to lobar pneumonia associated with hemoptysis  -He was febrile to 103, tachypneic tachycardic in distress on admission, required BiPAP on admission, now off -Influenza PCR was negative, respiratory virus panel is negative -This was associated with reactive airway disease and wheezing, clinically improving now, transition to oral cefdinir and prednisone -Continue to wean off oxygen -discharge planning -Aspirin and Lovenox for DVT prophylaxis temporarily held due to hemoptysis  Acute on chronic diastolic CHF -Diuresed with IV Lasix initially -Clinically appears euvolemic now, continue oral diuretics -Monitor bmet, urine output  Paroxysmal A. fib with RVR. -Improving, rate controlled at this time, continue Coreg 25 mg twice daily -Held off on starting anticoagulation due to ongoing hemoptysis this admission, advised patient and daughter to have her follow-up with cardiology quickly in few weeks and starting Eliquis then  Asymptomatic bacteriuria -Culture polymicrobial, no indication for antibiotics, monitor  Hypokalemia:  Better.  Monitor.  Essential hypertension - Restart blood pressure medications when able  History of lupus:  Patient reports no recent flare.  Type 2 diabetes mellitus, uncontrolled with hyperglycemia. hold her oral hypoglycemic agents. On insulin sliding scale moderate 6.1 hemoglobin A1c.   Morbid obesity Body mass index is 32.41  kg/m.   Diet: regular diet DVT Prophylaxis: subcutaneous Heparin  Advance goals of care discussion: full code  Family Communication: Daughter at bedside  Disposition: Home tomorrow if improved  Consultants: none Procedures: noen  Scheduled Meds: . azithromycin  500 mg Oral Daily  . benzonatate  100 mg Oral TID  . carvedilol  25 mg Oral BID  . cefdinir  300 mg Oral Q12H  . escitalopram  20 mg Oral Daily  . furosemide  20 mg Oral BID  . guaiFENesin  600 mg Oral BID  . insulin aspart  0-15 Units Subcutaneous TID WC  . insulin aspart  0-5 Units Subcutaneous QHS  . insulin aspart  2 Units Subcutaneous TID WC  . insulin glargine  15 Units Subcutaneous QHS  . ipratropium  0.5 mg Nebulization TID  . levalbuterol  0.63 mg Nebulization TID  . methylPREDNISolone (SOLU-MEDROL) injection  40 mg Intravenous BID  . mirtazapine  7.5 mg Oral QHS  . pantoprazole  40 mg Oral BID  . pravastatin  10 mg Oral Daily  . [START ON 10/20/2018] predniSONE  20 mg Oral Q breakfast  . pregabalin  75 mg Oral TID  . senna-docusate  1 tablet Oral BID  . sodium chloride flush  3 mL Intravenous Q12H   Continuous Infusions:  PRN Meds: acetaminophen **OR** acetaminophen, metoprolol tartrate, ondansetron **OR** ondansetron (ZOFRAN) IV, RESOURCE THICKENUP CLEAR Antibiotics: Anti-infectives (From admission, onward)   Start     Dose/Rate Route Frequency Ordered Stop   10/19/18 1200  cefdinir (OMNICEF) capsule 300 mg     300 mg Oral Every 12 hours 10/19/18 1047     10/16/18 1030  azithromycin (ZITHROMAX) tablet 500 mg     500 mg Oral Daily 10/16/18 1015     10/14/18 1915  cefTRIAXone (ROCEPHIN) 2 g in sodium chloride 0.9 % 100 mL IVPB  Status:  Discontinued     2 g 200 mL/hr over 30 Minutes Intravenous Every 24 hours 10/14/18 1914 10/19/18 1047   10/14/18 1915  azithromycin (ZITHROMAX) 500 mg in sodium chloride 0.9 % 250 mL IVPB  Status:  Discontinued     500 mg 250 mL/hr over 60 Minutes Intravenous Every  24 hours 10/14/18 1914 10/16/18 1015       Objective: Physical Exam: Vitals:   10/19/18 0447 10/19/18 0808 10/19/18 0911 10/19/18 1204  BP: (!) 165/80 (!) 153/99  (!) 143/78  Pulse: 68 75  64  Resp: 11 17  16   Temp:  99 F (37.2 C)  98.3 F (36.8 C)  TempSrc:  Oral  Oral  SpO2: 92% 90% 95% 98%  Weight:      Height:       No intake or output data in the 24 hours ending 10/19/18 1355 Filed Weights   10/14/18 1852  Weight: 95.3 kg   Gen: Awake, Alert, Oriented X 3, no distress HEENT: PERRLA, Neck supple, no JVD Lungs: Improving air movement, scattered rhonchi CVS: S1-S2/irregularly irregular rhythm  Abd: soft, Non tender, non distended, BS present Extremities: No edema Skin: no new rashes Neurologic: Grossly no focal neuro deficit. Bilaterally Equal motor strength  Data Reviewed: CBC: Recent Labs  Lab 10/14/18 1927 10/15/18 0730 10/16/18 0220 10/17/18 0403 10/18/18 0235 10/19/18 0239  WBC 11.2* 11.0* 11.5* 9.4 8.3 10.6*  NEUTROABS 9.7*  --   --   --   --   --   HGB 12.4 12.3 10.8* 10.9* 11.1* 12.2  HCT 39.2 39.8 34.7* 35.2* 34.6* 39.1  MCV 80.7 79.9* 79.4* 80.4 80.7 78.7*  PLT 283 265 250 262 303 509   Basic Metabolic Panel: Recent Labs  Lab 10/15/18 0730 10/16/18 0220 10/17/18 0403 10/18/18 0235 10/19/18 0239  NA 138 140 139 135 136  K 3.8 3.8 4.2 4.2 4.0  CL 107 108 103 99 100  CO2 20* 22 25 27 25   GLUCOSE 215* 202* 204* 213* 191*  BUN 9 15 23  32* 33*  CREATININE 0.83 0.82 0.93 1.08* 1.10*  CALCIUM 8.7* 8.5* 8.5* 8.4* 8.6*  MG  --  1.9  --   --   --     Liver Function Tests: Recent Labs  Lab 10/14/18 1927  AST 17  ALT 15  ALKPHOS 47  BILITOT 1.1  PROT 7.5  ALBUMIN 3.8   No results for input(s): LIPASE, AMYLASE in the last 168 hours. No results for input(s): AMMONIA in the last 168 hours. Coagulation Profile: No results for input(s): INR, PROTIME in the last 168 hours. Cardiac Enzymes: Recent Labs  Lab 10/15/18 0212 10/15/18 0730  10/15/18 1408  TROPONINI <0.03 <0.03 <0.03   BNP (last 3 results) No results for input(s): PROBNP in the last 8760 hours. CBG: Recent Labs  Lab 10/18/18 1511 10/18/18 1722 10/18/18 2137 10/19/18 0813 10/19/18 1158  GLUCAP 344* 272* 160* 237* 245*   Studies: No results found.   Time spent: 25 minutes  Author: Domenic Polite, MD  10/19/2018 1:55 PM

## 2018-10-20 ENCOUNTER — Inpatient Hospital Stay (HOSPITAL_COMMUNITY): Payer: Medicare Other

## 2018-10-20 LAB — TROPONIN I
Troponin I: <0.03 ng/mL (ref <0.03)
Troponin I: <0.03 ng/mL (ref <0.03)

## 2018-10-20 LAB — BASIC METABOLIC PANEL
Anion gap: 9 (ref 5–15)
BUN: 29 mg/dL — ABNORMAL HIGH (ref 8–23)
CO2: 29 mmol/L (ref 22–32)
Calcium: 8.7 mg/dL — ABNORMAL LOW (ref 8.9–10.3)
Chloride: 99 mmol/L (ref 98–111)
Creatinine, Ser: 1.02 mg/dL — ABNORMAL HIGH (ref 0.44–1.00)
GFR calc Af Amer: >60 mL/min (ref >60)
GFR calc non Af Amer: 55 mL/min — ABNORMAL LOW (ref >60)
Glucose, Bld: 172 mg/dL — ABNORMAL HIGH (ref 70–99)
Potassium: 4.2 mmol/L (ref 3.5–5.1)
Sodium: 137 mmol/L (ref 135–145)

## 2018-10-20 LAB — CBC
HCT: 39.9 % (ref 36.0–46.0)
Hemoglobin: 12.5 g/dL (ref 12.0–15.0)
MCH: 24.7 pg — ABNORMAL LOW (ref 26.0–34.0)
MCHC: 31.3 g/dL (ref 30.0–36.0)
MCV: 78.7 fL — ABNORMAL LOW (ref 80.0–100.0)
Platelets: 382 10*3/uL (ref 150–400)
RBC: 5.07 MIL/uL (ref 3.87–5.11)
RDW: 13.6 % (ref 11.5–15.5)
WBC: 12 10*3/uL — ABNORMAL HIGH (ref 4.0–10.5)
nRBC: 0.3 % — ABNORMAL HIGH (ref 0.0–0.2)

## 2018-10-20 LAB — GLUCOSE, CAPILLARY
Glucose-Capillary: 187 mg/dL — ABNORMAL HIGH (ref 70–99)
Glucose-Capillary: 172 mg/dL — ABNORMAL HIGH (ref 70–99)
Glucose-Capillary: 240 mg/dL — ABNORMAL HIGH (ref 70–99)
Glucose-Capillary: 258 mg/dL — ABNORMAL HIGH (ref 70–99)

## 2018-10-20 MED ORDER — ASPIRIN 325 MG PO TABS
325.0000 mg | ORAL_TABLET | Freq: Every day | ORAL | Status: DC
  Administered 2018-10-20 – 2018-10-21 (×2): 325 mg via ORAL
  Filled 2018-10-20 (×2): qty 1

## 2018-10-20 MED ORDER — MORPHINE SULFATE (PF) 2 MG/ML IV SOLN: 1.0000 mg | INTRAVENOUS | Status: DC | PRN

## 2018-10-20 MED ORDER — HEPARIN SODIUM (PORCINE) 5000 UNIT/ML IJ SOLN
5000.0000 [IU] | Freq: Three times a day (TID) | INTRAMUSCULAR | Status: DC
  Administered 2018-10-20 – 2018-10-21 (×3): 5000 [IU] via SUBCUTANEOUS
  Filled 2018-10-20 (×3): qty 1

## 2018-10-20 NOTE — Progress Notes (Signed)
VAST RN consulted to place PIV. Pt does not have any IV meds or fluids ordered at this time. VAST RN called unit and spoke with pt's nurse, Suezanne Jacquet. He stated pt was scheduled for dc today, but started having chest pain. A 12 lead EKG was performed and looked ok. Dr. Broadus John requested an IV be started. VAST RN attempting to contact Dr. Broadus John for further information.

## 2018-10-20 NOTE — Progress Notes (Signed)
[facility] Progress Note  Patient: Deborah Weeks HEN:[id number]   PCP: Bernerd Limbo, MD DOB: January 07, 1946   DOA: 10/14/2018   DOS: 10/20/2018   Date of Service: the patient was seen and examined on 10/20/2018  Brief hospital course: Pt. with PMH of HTN, HLD, lupus, DM type II; admitted on 10/14/2018, presented with complaint of shortness of breath, was found to have acute pneumonia with COPD exacerbation. -Improving  Subjective: -was doing well, but this morning had 2 episodes of chest pain, sharp and pressure like, not pleuritic and cough  Assessment and Plan:  Sepsis/acute respiratory failure with hypoxia  -Due to lobar pneumonia associated with hemoptysis  -He was febrile to 103, tachypneic tachycardic in distress on admission, required BiPAP on admission, now off -Influenza PCR was negative, respiratory virus panel is negative -This was associated with reactive airway disease and wheezing, clinically improving now, transition to oral cefdinir and prednisone -Continue to wean off oxygen  Chest pain -likely due to bronchitis/pneumonia -not pleuritic, no hypoxia or tachycardia, do not suspect PE -EKG with NSR, no acute ST T wave changes, troponin x 1 negative will repeat -ECHo 2days ago with normal EF and wall motion, no pericardial effusion -monitor on tele, check another troponin  Acute on chronic diastolic CHF -Diuresed with IV Lasix initially -Clinically appears euvolemic now, continue oral diuretics -Monitor bmet, urine output  Paroxysmal A. fib with RVR. -Improving, rate controlled at this time, continue Coreg 25 mg twice daily -Held off on starting anticoagulation due to ongoing hemoptysis this admission, advised patient and daughter to have her follow-up with cardiology quickly in few weeks and starting Eliquis then  Asymptomatic bacteriuria -Culture polymicrobial, no indication for antibiotics, monitor  Hypokalemia:  Better.  Monitor.  Essential  hypertension - Restart blood pressure medications when able  History of lupus:  Patient reports no recent flare.  Type 2 diabetes mellitus, uncontrolled with hyperglycemia. hold her oral hypoglycemic agents. On insulin sliding scale moderate 6.1 hemoglobin A1c.   Morbid obesity Body mass index is 32.41 kg/m.   Diet: regular diet DVT Prophylaxis: subcutaneous Heparin  Advance goals of care discussion: full code  Family Communication: Daughter at bedside  Disposition: Home tomorrow if stable  Consultants: none Procedures: noen  Scheduled Meds: . aspirin  325 mg Oral Daily  . azithromycin  500 mg Oral Daily  . benzonatate  100 mg Oral TID  . carvedilol  25 mg Oral BID  . cefdinir  300 mg Oral Q12H  . escitalopram  20 mg Oral Daily  . furosemide  20 mg Oral BID  . guaiFENesin  600 mg Oral BID  . heparin injection (subcutaneous)  5,000 Units Subcutaneous Q8H  . insulin aspart  0-15 Units Subcutaneous TID WC  . insulin aspart  0-5 Units Subcutaneous QHS  . insulin aspart  2 Units Subcutaneous TID WC  . insulin glargine  15 Units Subcutaneous QHS  . ipratropium  0.5 mg Nebulization TID  . levalbuterol  0.63 mg Nebulization TID  . mirtazapine  7.5 mg Oral QHS  . pantoprazole  40 mg Oral BID  . pravastatin  10 mg Oral Daily  . predniSONE  20 mg Oral Q breakfast  . pregabalin  75 mg Oral TID  . senna-docusate  1 tablet Oral BID  . sodium chloride flush  3 mL Intravenous Q12H   Continuous Infusions:  PRN Meds: acetaminophen **OR** acetaminophen, metoprolol tartrate, morphine injection, ondansetron **OR** ondansetron (ZOFRAN) IV, RESOURCE THICKENUP CLEAR Antibiotics: Anti-infectives (From admission, onward)  Start     Dose/Rate Route Frequency Ordered Stop   10/19/18 1200  cefdinir (OMNICEF) capsule 300 mg     300 mg Oral Every 12 hours 10/19/18 1047     10/16/18 1030  azithromycin (ZITHROMAX) tablet 500 mg     500 mg Oral Daily 10/16/18 1015     10/14/18 1915   cefTRIAXone (ROCEPHIN) 2 g in sodium chloride 0.9 % 100 mL IVPB  Status:  Discontinued     2 g 200 mL/hr over 30 Minutes Intravenous Every 24 hours 10/14/18 1914 10/19/18 1047   10/14/18 1915  azithromycin (ZITHROMAX) 500 mg in sodium chloride 0.9 % 250 mL IVPB  Status:  Discontinued     500 mg 250 mL/hr over 60 Minutes Intravenous Every 24 hours 10/14/18 1914 10/16/18 1015       Objective: Physical Exam: Vitals:   10/19/18 2359 10/20/18 0345 10/20/18 0803 10/20/18 1211  BP: (!) 144/68 126/70 (!) 164/77 136/68  Pulse: 77 74 61 61  Resp: 20 14 16 15   Temp: 98 F (36.7 C) 98.4 F (36.9 C) 98.2 F (36.8 C) 98.4 F (36.9 C)  TempSrc: Oral Oral Oral Oral  SpO2: 93% 94% 96% 95%  Weight:      Height:        Intake/Output Summary (Last 24 hours) at 10/20/2018 1413 Last data filed at 10/20/2018 0732 Gross per 24 hour  Intake -  Output 1800 ml  Net -1800 ml   Filed Weights   10/14/18 1852  Weight: 95.3 kg   Gen: Awake, Alert, Oriented X 3, no distress HEENT: PERRLA, Neck supple, no JVD Lungs: improved air movement bilaterally, CTAB CVS: S1S2/Irregular rate and rhythm Abd: soft, Non tender, non distended, BS present Extremities: No edema Skin: no new rashes Neurologic: Grossly no focal neuro deficit. Bilaterally Equal motor strength  Data Reviewed: CBC: Recent Labs  Lab 10/14/18 1927  10/16/18 0220 10/17/18 0403 10/18/18 0235 10/19/18 0239 10/20/18 0343  WBC 11.2*   < > 11.5* 9.4 8.3 10.6* 12.0*  NEUTROABS 9.7*  --   --   --   --   --   --   HGB 12.4   < > 10.8* 10.9* 11.1* 12.2 12.5  HCT 39.2   < > 34.7* 35.2* 34.6* 39.1 39.9  MCV 80.7   < > 79.4* 80.4 80.7 78.7* 78.7*  PLT 283   < > 250 262 303 363 382   < > = values in this interval not displayed.   Basic Metabolic Panel: Recent Labs  Lab 10/16/18 0220 10/17/18 0403 10/18/18 0235 10/19/18 0239 10/20/18 0343  NA 140 139 135 136 137  K 3.8 4.2 4.2 4.0 4.2  CL 108 103 99 100 99  CO2 22 25 27 25 29    GLUCOSE 202* 204* 213* 191* 172*  BUN 15 23 32* 33* 29*  CREATININE 0.82 0.93 1.08* 1.10* 1.02*  CALCIUM 8.5* 8.5* 8.4* 8.6* 8.7*  MG 1.9  --   --   --   --     Liver Function Tests: Recent Labs  Lab 10/14/18 1927  AST 17  ALT 15  ALKPHOS 47  BILITOT 1.1  PROT 7.5  ALBUMIN 3.8   No results for input(s): LIPASE, AMYLASE in the last 168 hours. No results for input(s): AMMONIA in the last 168 hours. Coagulation Profile: No results for input(s): INR, PROTIME in the last 168 hours. Cardiac Enzymes: Recent Labs  Lab 10/15/18 0212 10/15/18 0730 10/15/18 1408 10/20/18 7035  TROPONINI <0.03 <0.03 <0.03 <0.03   BNP (last 3 results) No results for input(s): PROBNP in the last 8760 hours. CBG: Recent Labs  Lab 10/19/18 1158 10/19/18 1707 10/19/18 2205 10/20/18 0802 10/20/18 1209  GLUCAP 245* 198* 280* 187* 172*   Studies: Dg Chest Port 1 View  Result Date: 10/20/2018 CLINICAL DATA:  Chest pain. EXAM: PORTABLE CHEST 1 VIEW COMPARISON:  Chest radiograph 10/16/2018 FINDINGS: Monitoring leads overlie the patient. Enlarged cardiac and mediastinal contours. Minimal heterogeneous opacities favored represent atelectasis. No pleural effusion or pneumothorax. IMPRESSION: Cardiomegaly.  Basilar atelectasis. Electronically Signed   By: Lovey Newcomer M.D.   On: 10/20/2018 09:08     Time spent: 25 minutes  Author: Domenic Polite, MD  10/20/2018 2:13 PM

## 2018-10-20 NOTE — Progress Notes (Signed)
VAST RN spoke with Dr. Broadus John regarding pt's lack of IV access and questionable need for one. Advised that pt reported chest pain subsided at this time. Pt does not have any IV meds or fluids ordered at this time.  Due to work up of pt regarding chest pain and waiting for labs, x-ray results, and posiible need for CT Dr. Broadus John wanted pt to have IV access. VAST RN explained that if CT is needed a specific size IV in a specific location would likely be needed. Dr. Broadus John stated we would decide that later, but she wanted pt to have IV access now.  After talking to pt and explaining physician felt it was in her best interest, pt gave permission for VAST RN to place an IV.

## 2018-10-21 LAB — BASIC METABOLIC PANEL
Anion gap: 9 (ref 5–15)
BUN: 26 mg/dL — ABNORMAL HIGH (ref 8–23)
CO2: 28 mmol/L (ref 22–32)
Calcium: 8.3 mg/dL — ABNORMAL LOW (ref 8.9–10.3)
Chloride: 102 mmol/L (ref 98–111)
Creatinine, Ser: 1.17 mg/dL — ABNORMAL HIGH (ref 0.44–1.00)
GFR calc Af Amer: 54 mL/min — ABNORMAL LOW (ref >60)
GFR calc non Af Amer: 47 mL/min — ABNORMAL LOW (ref >60)
Glucose, Bld: 133 mg/dL — ABNORMAL HIGH (ref 70–99)
Potassium: 3.4 mmol/L — ABNORMAL LOW (ref 3.5–5.1)
Sodium: 139 mmol/L (ref 135–145)

## 2018-10-21 LAB — CBC
HCT: 38.6 % (ref 36.0–46.0)
Hemoglobin: 12.1 g/dL (ref 12.0–15.0)
MCH: 24.9 pg — ABNORMAL LOW (ref 26.0–34.0)
MCHC: 31.3 g/dL (ref 30.0–36.0)
MCV: 79.6 fL — ABNORMAL LOW (ref 80.0–100.0)
Platelets: 376 10*3/uL (ref 150–400)
RBC: 4.85 MIL/uL (ref 3.87–5.11)
RDW: 13.7 % (ref 11.5–15.5)
WBC: 11.5 10*3/uL — ABNORMAL HIGH (ref 4.0–10.5)
nRBC: 0.3 % — ABNORMAL HIGH (ref 0.0–0.2)

## 2018-10-21 LAB — GLUCOSE, CAPILLARY
Glucose-Capillary: 131 mg/dL — ABNORMAL HIGH (ref 70–99)
Glucose-Capillary: 205 mg/dL — ABNORMAL HIGH (ref 70–99)

## 2018-10-21 MED ORDER — MILK AND MOLASSES ENEMA
1.0000 | Freq: Once | RECTAL | Status: DC
  Filled 2018-10-21 (×2): qty 250

## 2018-10-21 MED ORDER — SENNOSIDES-DOCUSATE SODIUM 8.6-50 MG PO TABS: 1.0000 | ORAL_TABLET | Freq: Every day | ORAL | 0 refills | Status: DC

## 2018-10-21 MED ORDER — POTASSIUM CHLORIDE ER 20 MEQ PO TBCR: 20.0000 meq | EXTENDED_RELEASE_TABLET | Freq: Every day | ORAL | 0 refills | Status: DC

## 2018-10-21 MED ORDER — BISACODYL 10 MG RE SUPP
10.0000 mg | Freq: Once | RECTAL | Status: AC
  Administered 2018-10-21: 10 mg via RECTAL
  Filled 2018-10-21: qty 1

## 2018-10-21 MED ORDER — AMLODIPINE BESYLATE 10 MG PO TABS: 10.0000 mg | ORAL_TABLET | Freq: Every day | ORAL | 0 refills | Status: DC

## 2018-10-21 MED ORDER — FUROSEMIDE 20 MG PO TABS: 20.0000 mg | ORAL_TABLET | Freq: Two times a day (BID) | ORAL | 0 refills | Status: DC

## 2018-10-21 MED ORDER — PREDNISONE 20 MG PO TABS: 20.0000 mg | ORAL_TABLET | Freq: Every day | ORAL | 0 refills | Status: AC

## 2018-10-21 NOTE — Discharge Summary (Signed)
Physician Discharge Summary  Deborah Weeks DXA:[id number] DOB: 1945/11/04 DOA: 10/14/2018  PCP: Bernerd Limbo, MD  Admit date: 10/14/2018 Discharge date: 10/21/2018  Time spent: 35 minutes  Recommendations for Outpatient Follow-up:  PCP in 1 week, please monitor bmet at follow-up Cardiology Dr. Marlou Porch in 2 weeks, please consider starting anticoagulation for paroxysmal atrial fibrillation if no residual hemoptysis  Discharge Diagnoses:  Principal Problem:   Acute respiratory failure with hypoxia ([name])   Community-acquired pneumonia with reactive airway disease   Hemoptysis   Paroxysmal atrial fibrillation   Type 2 diabetes mellitus ([place])   Controlled type 2 diabetes mellitus without complication ([facility])   Systemic lupus erythematosus ([place])   Hypokalemia   Chest pain   Discharge Condition: Improved  Diet recommendation: Diabetic, low-sodium Filed Weights   10/14/18 1852  Weight: 95.3 kg    History of present illness:  73 year old female history of diabetes, hypertension, edema on diuretics presented to the ED with fever cough congestion shortness of breath  Hospital Course:   Sepsis/acute respiratory failure with hypoxia  -Due to lobar pneumonia associated with hemoptysis  -was febrile to 103, tachypneic tachycardic in distress on admission, required BiPAP on admission -Influenza PCR was negative, respiratory virus panel was negative -This was associated with reactive airway disease and wheezing, clinically improving now, completed antibiotic course, at the tail end of prednisone taper  -Improved, hemoptysis has resolved now had very high oxygen requirements early this admission improved considerably, currently down to 1-2 L will likely DC home on 2 L home O2 for few weeks  Chest pain -likely due to bronchitis/pneumonia -not pleuritic, no hypoxia or tachycardia, do not suspect PE -EKG with NSR, no acute ST T wave changes, troponin x 3 negative  -ECHo noted normal EF and wall  motion, no pericardial effusion -resolved now, with above treatment  Acute on chronic diastolic CHF -Diuresed with IV Lasix initially -Clinically appears euvolemic now, continue oral diuretics -Home dose of Lasix increased to 20 mg twice daily from daily -Please check electrolytes and creatinine at follow-up  Paroxysmal A. fib with RVR. -During this hospitalization she was noted to have rapid atrial fibrillation, this subsequently improved she was continued on Coreg 25 mg twice daily -Held off on starting anticoagulation due to ongoing hemoptysis this admission, advised patient and daughter to have her follow-up with cardiology Dr.Skains quickly in few weeks and starting Eliquis then  Asymptomatic bacteriuria -Culture polymicrobial, no indication for antibiotics  Hypokalemia:  -replaced.  Essential hypertension -Restart blood pressure medications when able  History of lupus:  Patient reports no recentflare.  Type 2 diabetes mellitus, uncontrolled with hyperglycemia. -Required insulin due to hyperglycemia from steroids, hemoglobin A1c was only 6.1 at discharge she was only on 2 days of very low-dose of prednisone hence advised to resume home regimen   Morbid obesity Body mass index is 32.41 kg/m.  Discharge Exam: Vitals:   10/21/18 0819 10/21/18 0835  BP: (!) 148/77   Pulse: (!) 59   Resp: 20   Temp: 98.3 F (36.8 C)   SpO2: 95% 95%    General: AAOx3 Cardiovascular: S1S2/RRR Respiratory: improved air movement   Discharge Instructions   Discharge Instructions    Diet - low sodium heart healthy   Complete by:  As directed    Diet Carb Modified   Complete by:  As directed    Increase activity slowly   Complete by:  As directed      Allergies as of 10/21/2018      Reactions  Atorvastatin Other (See Comments)   cramps   Escitalopram Oxalate Other (See Comments)   Insomnia   Codeine Nausea And Vomiting   Niacin And Related Itching, Rash       Medication List    STOP taking these medications   amLODipine-olmesartan 10-40 MG tablet Commonly known as:  AZOR   diclofenac 75 MG EC tablet Commonly known as:  VOLTAREN   NONFORMULARY OR COMPOUNDED ITEM     TAKE these medications   acetaminophen 650 MG CR tablet Commonly known as:  TYLENOL Take 1,300 mg by mouth every 8 (eight) hours as needed for pain.   albuterol 108 (90 Base) MCG/ACT inhaler Commonly known as:  PROVENTIL HFA;VENTOLIN HFA Inhale 2 puffs every 4 (four) hours as needed into the lungs for wheezing or shortness of breath.   amLODipine 10 MG tablet Commonly known as:  NORVASC Take 1 tablet (10 mg total) by mouth daily.   aspirin 81 MG tablet Take 81 mg by mouth daily.   carvedilol 25 MG tablet Commonly known as:  COREG Take 25 mg by mouth 2 (two) times daily.   escitalopram 20 MG tablet Commonly known as:  LEXAPRO Take 20 mg by mouth daily.   furosemide 20 MG tablet Commonly known as:  LASIX Take 1 tablet (20 mg total) by mouth 2 (two) times daily. What changed:  when to take this   guaiFENesin 100 MG/5ML Soln Commonly known as:  ROBITUSSIN Take 5 mLs by mouth every 4 (four) hours as needed for cough or to loosen phlegm.   LYRICA 75 MG capsule Generic drug:  pregabalin Take 75 mg by mouth 3 (three) times daily.   metFORMIN 1000 MG tablet Commonly known as:  GLUCOPHAGE Take 500 mg by mouth 2 (two) times daily with a meal.   mirtazapine 15 MG tablet Commonly known as:  REMERON Take 7.5 mg by mouth at bedtime.   Potassium Chloride ER 20 MEQ Tbcr Take 20 mEq by mouth daily.   pravastatin 10 MG tablet Commonly known as:  PRAVACHOL Take 10 mg by mouth daily. Takes instead of Welchol   predniSONE 20 MG tablet Commonly known as:  DELTASONE Take 1 tablet (20 mg total) by mouth daily with breakfast for 2 days. Start taking on:  October 22, 2018   senna-docusate 8.6-50 MG tablet Commonly known as:  Senokot-S Take 1 tablet by mouth at  bedtime.            Durable Medical Equipment  (From admission, onward)         Start     Ordered   10/21/18 1341  For home use only DME oxygen  Once    Question Answer Comment  Mode or (Route) Nasal cannula   Liters per Minute 2   Frequency Continuous (stationary and portable oxygen unit needed)   Oxygen conserving device Yes   Oxygen delivery system Gas      10/21/18 1340         Allergies  Allergen Reactions  . Atorvastatin Other (See Comments)    cramps  . Escitalopram Oxalate Other (See Comments)    Insomnia  . Codeine Nausea And Vomiting  . Niacin And Related Itching and Rash   Follow-up Information    Bernerd Limbo, MD. Schedule an appointment as soon as possible for a visit in 1 week(s).   Specialty:  Family Medicine Contact information: [address] [place] [address] [phone]        Marlou Porch,  Thana Farr, MD. Schedule an appointment as soon as possible for a visit in 2 week(s).   Specialty:  Cardiology Contact information: [address] [place] [address] [phone]            The results of significant diagnostics from this hospitalization (including imaging, microbiology, ancillary and laboratory) are listed below for reference.    Significant Diagnostic Studies: Dg Chest 2 View  Result Date: 10/16/2018 CLINICAL DATA:  Shortness of breath and cough.  Pneumonia EXAM: CHEST - 2 VIEW COMPARISON:  Two days ago FINDINGS: Diffuse interstitial opacity with Kerley lines. Small pleural effusions. Normal heart size and mediastinal contours. IMPRESSION: CHF pattern. There is provided history of pneumonia and atypical or superimposed infection is not excluded. Electronically Signed   By: Monte Fantasia M.D.   On: 10/16/2018 07:22   Dg Chest Port 1 View  Result Date: 10/20/2018 CLINICAL DATA:  Chest pain. EXAM: PORTABLE CHEST 1 VIEW COMPARISON:  Chest radiograph 10/16/2018 FINDINGS: Monitoring leads overlie  the patient. Enlarged cardiac and mediastinal contours. Minimal heterogeneous opacities favored represent atelectasis. No pleural effusion or pneumothorax. IMPRESSION: Cardiomegaly.  Basilar atelectasis. Electronically Signed   By: Lovey Newcomer M.D.   On: 10/20/2018 09:08   Dg Chest Port 1 View  Result Date: 10/14/2018 CLINICAL DATA:  Hypoxia and flu like symptoms. EXAM: PORTABLE CHEST 1 VIEW COMPARISON:  05/29/2018 FINDINGS: There is opacity extending from the left mid lung to the lung base, obscuring the left hemidiaphragm. Remainder of the lungs show prominent bronchovascular markings but are otherwise clear. Cardiac silhouette is normal in size. No mediastinal or hilar masses. Possible left pleural effusion. No convincing right effusion. No pneumothorax. Skeletal structures are grossly intact. IMPRESSION: Left mid to lower lung airspace opacity consistent with pneumonia. Electronically Signed   By: Lajean Manes M.D.   On: 10/14/2018 19:45   Dg Esophagus Inc Scout Chest & Delayed Img Single Cm (ba Or Sol)  Result Date: 10/16/2018 CLINICAL DATA:  Cough, reflux esophagitis, aspiration pneumonia EXAM: ESOPHOGRAM/BARIUM SWALLOW TECHNIQUE: Combined double contrast and single contrast examination performed using effervescent crystals, thick barium liquid, and thin barium liquid. FLUOROSCOPY TIME:  Fluoroscopy Time:  1 minutes 12 seconds Radiation Exposure Index (if provided by the fluoroscopic device): Number of Acquired Spot Images: 0 COMPARISON:  None. FINDINGS: Fluoroscopic evaluation of swallowing demonstrates no laryngeal penetration or aspiration. No fixed stricture, fold thickening or mass. No reflux with the water siphon maneuver. There is disruption of all primary esophageal peristaltic waves. IMPRESSION: Esophageal dysmotility. No fixed stricture. No reflux or aspiration noted. Electronically Signed   By: Rolm Baptise M.D.   On: 10/16/2018 08:28    Microbiology: Recent Results (from the past 240  hour(s))  Blood Culture (routine x 2)     Status: None   Collection Time: 10/14/18  7:14 PM  Result Value Ref Range Status   Specimen Description BLOOD RIGHT ANTECUBITAL  Final   Special Requests   Final    BOTTLES DRAWN AEROBIC AND ANAEROBIC Blood Culture adequate volume   Culture   Final    NO GROWTH 5 DAYS Performed at [facility], [address], [place], [place] [address]    Report Status 10/19/2018 FINAL  Final  Blood Culture (routine x 2)     Status: None   Collection Time: 10/14/18  7:28 PM  Result Value Ref Range Status   Specimen Description BLOOD BLOOD LEFT HAND  Final   Special Requests   Final  BOTTLES DRAWN AEROBIC AND ANAEROBIC Blood Culture adequate volume   Culture   Final    NO GROWTH 5 DAYS Performed at [facility], [place] [address] [place], [place] [address]    Report Status 10/19/2018 FINAL  Final  Urine Culture     Status: Abnormal   Collection Time: 10/14/18 11:40 PM  Result Value Ref Range Status   Specimen Description URINE, RANDOM  Final   Special Requests   Final    NONE Performed at [facility], [place] [address], [place], [place] [address]    Culture MULTIPLE SPECIES PRESENT, SUGGEST RECOLLECTION (A)  Final   Report Status 10/16/2018 FINAL  Final  MRSA PCR Screening     Status: None   Collection Time: 10/15/18  2:31 AM  Result Value Ref Range Status   MRSA by PCR NEGATIVE NEGATIVE Final    Comment:        The GeneXpert MRSA Assay (FDA approved for NASAL specimens only), is one component of a comprehensive MRSA colonization surveillance program. It is not intended to diagnose MRSA infection nor to guide or monitor treatment for MRSA infections. Performed at [facility], [place] [address], [place], [place] [address]   Respiratory Panel by PCR     Status: None   Collection Time: 10/16/18  7:56 AM  Result Value Ref Range Status   Adenovirus NOT DETECTED NOT DETECTED Final   Coronavirus 229E NOT DETECTED NOT DETECTED  Final   Coronavirus HKU1 NOT DETECTED NOT DETECTED Final   Coronavirus NL63 NOT DETECTED NOT DETECTED Final   Coronavirus OC43 NOT DETECTED NOT DETECTED Final   Metapneumovirus NOT DETECTED NOT DETECTED Final   Rhinovirus / Enterovirus NOT DETECTED NOT DETECTED Final   Influenza A NOT DETECTED NOT DETECTED Final   Influenza B NOT DETECTED NOT DETECTED Final   Parainfluenza Virus 1 NOT DETECTED NOT DETECTED Final   Parainfluenza Virus 2 NOT DETECTED NOT DETECTED Final   Parainfluenza Virus 3 NOT DETECTED NOT DETECTED Final   Parainfluenza Virus 4 NOT DETECTED NOT DETECTED Final   Respiratory Syncytial Virus NOT DETECTED NOT DETECTED Final   Bordetella pertussis NOT DETECTED NOT DETECTED Final   Chlamydophila pneumoniae NOT DETECTED NOT DETECTED Final   Mycoplasma pneumoniae NOT DETECTED NOT DETECTED Final    Comment: Performed at [facility], [place] [address] [place], [place] [address]     Labs: Basic Metabolic Panel: Recent Labs  Lab 10/16/18 0220 10/17/18 0403 10/18/18 0235 10/19/18 0239 10/20/18 0343 10/21/18 0220  NA 140 139 135 136 137 139  K 3.8 4.2 4.2 4.0 4.2 3.4*  CL 108 103 99 100 99 102  CO2 22 25 27 25 29 28   GLUCOSE 202* 204* 213* 191* 172* 133*  BUN 15 23 32* 33* 29* 26*  CREATININE 0.82 0.93 1.08* 1.10* 1.02* 1.17*  CALCIUM 8.5* 8.5* 8.4* 8.6* 8.7* 8.3*  MG 1.9  --   --   --   --   --    Liver Function Tests: Recent Labs  Lab 10/14/18 1927  AST 17  ALT 15  ALKPHOS 47  BILITOT 1.1  PROT 7.5  ALBUMIN 3.8   No results for input(s): LIPASE, AMYLASE in the last 168 hours. No results for input(s): AMMONIA in the last 168 hours. CBC: Recent Labs  Lab 10/14/18 1927  10/17/18 0403 10/18/18 0235 10/19/18 0239 10/20/18 0343 10/21/18 0220  WBC 11.2*   < > 9.4 8.3 10.6* 12.0* 11.5*  NEUTROABS 9.7*  --   --   --   --   --   --   HGB 12.4   < > 10.9* 11.1* 12.2 12.5 12.1  HCT 39.2   < > 35.2* 34.6* 39.1 39.9 38.6  MCV 80.7   < > 80.4 80.7 78.7*  78.7* 79.6*  PLT 283   < > 262 303 363 382 376   < > = values in this interval not displayed.   Cardiac Enzymes: Recent Labs  Lab 10/15/18 0212 10/15/18 0730 10/15/18 1408 10/20/18 0852 10/20/18 1416  TROPONINI <0.03 <0.03 <0.03 <0.03 <0.03   BNP: BNP (last 3 results) Recent Labs    05/29/18 0105 10/18/18 0235  BNP 205.8* 332.5*    ProBNP (last 3 results) No results for input(s): PROBNP in the last 8760 hours.  CBG: Recent Labs  Lab 10/20/18 1209 10/20/18 1739 10/20/18 2107 10/21/18 0818 10/21/18 1336  GLUCAP 172* 240* 258* 131* 205*       Signed:  Domenic Polite MD.  [facility] 10/21/2018, 1:42 PM

## 2018-10-21 NOTE — Progress Notes (Signed)
SATURATION QUALIFICATIONS: (This note is used to comply with regulatory documentation for home oxygen)  Patient Saturations on Room Air at Rest = 80%  Patient Saturations on Room Air while Ambulating = 70%  Patient Saturations on 3 Liters of oxygen while Ambulating = 92%  Please briefly explain why patient needs home oxygen: Acute on Chronic Respiratory Failure, Congestive Heart Failure, Pneumonia, Physical Deconditioning

## 2018-10-21 NOTE — Care Management Note (Signed)
Case Management Note  Patient Details  Name: Deborah Weeks MRN: [id number] Date of Birth: 22-Feb-1946  Subjective/Objective:                    Action/Plan:  Patient o DC today with new home O2. [place] to nursing staff to document ambulatory sats. They report patient drops to 88%. CM made nursing staff aware that order cannot be processed w/o sats.  Notified Jermaine w [facility] that patient will need home O2. O2 will be delivered to patient room prior to DC pending ambulatory sats note and insurance auth through [facility].  No other CM needs.   Expected Discharge Date:  10/21/18               Expected Discharge Plan:  Home/Self Care  In-House Referral:     Discharge planning Services  CM Consult  Post Acute Care Choice:  Durable Medical Equipment Choice offered to:     DME Arranged:  Oxygen DME Agency:  [place]:    [facility] Agency:     Status of Service:  Completed, signed off  If discussed at [place] of Stay Meetings, dates discussed:    Additional Comments:  Carles Collet, RN 10/21/2018, 2:52 PM

## 2018-11-01 ENCOUNTER — Ambulatory Visit (INDEPENDENT_AMBULATORY_CARE_PROVIDER_SITE_OTHER): Payer: Medicare Other | Admitting: Cardiology

## 2018-11-01 ENCOUNTER — Encounter: Payer: Self-pay | Admitting: Cardiology

## 2018-11-01 VITALS — BP 136/72 | HR 71 | Ht 67.5 in | Wt 207.4 lb

## 2018-11-01 DIAGNOSIS — R0600 Dyspnea, unspecified: Secondary | ICD-10-CM | POA: Diagnosis not present

## 2018-11-01 DIAGNOSIS — I480 Paroxysmal atrial fibrillation: Secondary | ICD-10-CM

## 2018-11-01 DIAGNOSIS — I5032 Chronic diastolic (congestive) heart failure: Secondary | ICD-10-CM

## 2018-11-01 NOTE — Progress Notes (Signed)
Cardiology Office Note:    Date:  11/01/2018   ID:  Deborah Weeks, DOB 09/08/1946, MRN [id number]  PCP:  Buzzy Han, MD  Cardiologist:  Candee Furbish, MD  Electrophysiologist:  None   Referring MD: Bernerd Limbo, MD     History of Present Illness:    Deborah Weeks is a 73 y.o. female here for the follow-up of shortness of breath.  Has shortness of breath recurrent bronchitis cough lupus.  Had a BNP of 251 point and was told that she may have heart failure.  Nuclear stress test in 2010 showed no ischemia normal EF.  She has some shortness of breath when walking quickly or bending over cleaning mild edema in the ankles.  She was discharged from the hospital on 10/21/2018 after cough congestion shortness of breath with diagnosis of sepsis/acute respiratory failure with hypoxia secondary to lobar pneumonia with fever of 103.  Flu was negative.  Chest pain was noted likely secondary to bronchitis pneumonia nonpleuritic EKG showed no acute ST changes troponin was negative x3.  Acute on chronic diastolic heart failure was also noted during the hospitalization she was diuresed with IV Lasix initially and her home Lasix dose was increased to 20 mg twice a day from once a day.  She was also found to have paroxysmal atrial fibrillation with rapid ventricular response.  She was continued on carvedilol 25 mg a day.  She had some ongoing hemoptysis during the admission and therefore she is here today to discuss possibly starting Eliquis.  She is feeling much better since her hospitalization.  She did not think she would make it out of the hospital.  She was very sick.  No significant shortness of breath, no chest pain fevers chills nausea vomiting syncope.  Echocardiogram 10/17/2018: - Left ventricle: The cavity size was normal. Systolic function was   normal. The estimated ejection fraction was in the range of 60%   to 65%. Wall motion was normal; there were no regional wall   motion  abnormalities. The study is not technically sufficient to   allow evaluation of LV diastolic function. - Aortic valve: Trileaflet; normal thickness, mildly calcified   leaflets. - Tricuspid valve: There was trivial regurgitation. - Pulmonary arteries: Systolic pressure could not be accurately   estimated.  Nuclear stress test 08/08/2018:  Nuclear stress EF: 71%. No wall motion abnormalities  There was no ST segment deviation noted during stress.  The study is normal. There are no significant perfusion defects or ischemia identified.  This is a low risk study.   Past Medical History:  Diagnosis Date  . Arthritis   . Diabetes mellitus without complication ([place])   . High cholesterol   . Hypertension   . Lupus ([place])   . Neuropathy     History reviewed. No pertinent surgical history.  Current Medications: Current Meds  Medication Sig  . acetaminophen (TYLENOL) 650 MG CR tablet Take 1,300 mg by mouth every 8 (eight) hours as needed for pain.  Marland Kitchen albuterol (PROVENTIL HFA;VENTOLIN HFA) 108 (90 Base) MCG/ACT inhaler Inhale 2 puffs every 4 (four) hours as needed into the lungs for wheezing or shortness of breath.  Marland Kitchen amLODipine (NORVASC) 10 MG tablet Take 1 tablet (10 mg total) by mouth daily.  Marland Kitchen aspirin 81 MG tablet Take 81 mg by mouth daily.  . carvedilol (COREG) 25 MG tablet Take 25 mg by mouth 2 (two) times daily.   . diclofenac (VOLTAREN) 75 MG EC tablet Take 75 mg by mouth 2 (  two) times daily as needed.   Marland Kitchen escitalopram (LEXAPRO) 20 MG tablet Take 20 mg by mouth daily.  . furosemide (LASIX) 20 MG tablet Take 1 tablet (20 mg total) by mouth 2 (two) times daily.  Marland Kitchen LYRICA 75 MG capsule Take 75 mg by mouth 3 (three) times daily.  . metFORMIN (GLUCOPHAGE) 1000 MG tablet Take 500 mg by mouth 2 (two) times daily with a meal.   . mirtazapine (REMERON) 15 MG tablet Take 7.5 mg by mouth at bedtime.  . pravastatin (PRAVACHOL) 10 MG tablet Take 10 mg by mouth daily. Takes instead of  Welchol   Current Facility-Administered Medications for the 11/01/18 encounter (Office Visit) with Jerline Pain, MD  Medication  . triamcinolone acetonide (KENALOG) 10 MG/ML injection 10 mg     Allergies:   Atorvastatin; Escitalopram oxalate; Codeine; and Niacin and related   Social History   Socioeconomic History  . Marital status: Single    Spouse name: Not on file  . Number of children: Not on file  . Years of education: Not on file  . Highest education level: Not on file  Occupational History  . Not on file  Social Needs  . Financial resource strain: Not on file  . Food insecurity:    Worry: Not on file    Inability: Not on file  . Transportation needs:    Medical: Not on file    Non-medical: Not on file  Tobacco Use  . Smoking status: Former Research scientist (life sciences)  . Smokeless tobacco: Never Used  Substance and Sexual Activity  . Alcohol use: Never    Alcohol/week: 0.0 standard drinks    Frequency: Never  . Drug use: Never  . Sexual activity: Not on file  Lifestyle  . Physical activity:    Days per week: Not on file    Minutes per session: Not on file  . Stress: Not on file  Relationships  . Social connections:    Talks on phone: Not on file    Gets together: Not on file    Attends religious service: Not on file    Active member of club or organization: Not on file    Attends meetings of clubs or organizations: Not on file    Relationship status: Not on file  Other Topics Concern  . Not on file  Social History Narrative  . Not on file     Family History: The patient's family history includes Breast cancer in her cousin.  ROS:   Please see the history of present illness.    Denies any fevers chills nausea vomiting syncope bleeding all other systems reviewed and are negative.  EKGs/Labs/Other Studies Reviewed:    The following studies were reviewed today: Prior office notes echocardiogram stress test EKGs  EKG:  EKG is  ordered today.  Her EKGs reviewed, A. fib,  sinus rhythm  Recent Labs: 10/14/2018: ALT 15 10/16/2018: Magnesium 1.9 10/18/2018: B Natriuretic Peptide 332.5 10/21/2018: BUN 26; Creatinine, Ser 1.17; Hemoglobin 12.1; Platelets 376; Potassium 3.4; Sodium 139  Recent Lipid Panel    Component Value Date/Time   CHOL (H) 05/25/2009 0540    209        ATP III CLASSIFICATION:  <200     mg/dL   Desirable  200-239  mg/dL   Borderline High  >=240    mg/dL   High          TRIG 225 (H) 05/25/2009 0540   HDL 29 (L) 05/25/2009 0540   CHOLHDL  7.2 05/25/2009 0540   VLDL 45 (H) 05/25/2009 0540   LDLCALC (H) 05/25/2009 0540    135        Total Cholesterol/HDL:CHD Risk Coronary Heart Disease Risk Table                     Men   Women  1/2 Average Risk   3.4   3.3  Average Risk       5.0   4.4  2 X Average Risk   9.6   7.1  3 X Average Risk  23.4   11.0        Use the calculated Patient Ratio above and the CHD Risk Table to determine the patient's CHD Risk.        ATP III CLASSIFICATION (LDL):  <100     mg/dL   Optimal  100-129  mg/dL   Near or Above                    Optimal  130-159  mg/dL   Borderline  160-189  mg/dL   High  >190     mg/dL   Very High    Physical Exam:    VS:  BP 136/72   Pulse 71   Ht 5' 7.5" (1.715 m)   Wt 207 lb 6.4 oz (94.1 kg)   LMP  (LMP Unknown)   SpO2 95%   BMI 32.00 kg/m     Wt Readings from Last 3 Encounters:  11/01/18 207 lb 6.4 oz (94.1 kg)  10/14/18 210 lb (95.3 kg)  08/08/18 212 lb (96.2 kg)     GEN:  Well nourished, well developed in no acute distress HEENT: Normal NECK: No JVD; No carotid bruits LYMPHATICS: No lymphadenopathy CARDIAC: RRR, 1/6 SM, rubs, gallops RESPIRATORY:  Clear to auscultation without rales, wheezing or rhonchi  ABDOMEN: Soft, non-tender, non-distended MUSCULOSKELETAL:  No edema; No deformity  SKIN: Warm and dry NEUROLOGIC:  Alert and oriented x 3 PSYCHIATRIC:  Normal affect   ASSESSMENT:    1. Paroxysmal atrial fibrillation (HCC)   2. Dyspnea, unspecified  type   3. Chronic diastolic heart failure (HCC)    PLAN:    In order of problems listed above:  Paroxysmal atrial fibrillation in the setting of sepsis/acute hypoxic respiratory failure - EKG on 10/16/2018 was A. fib with heart rate in the low 100s.  Subsequent EKG at 10/20/2018 showed normal sinus rhythm. -Since she had mild hemoptysis during the hospitalization, no anticoagulation was utilized. -I think at this point, since she may have had a reversible cause to her atrial fibrillation i.e. sepsis, bronchitis, I would like to hold off on long-term anticoagulation.  Obviously if atrial fibrillation returns, we strongly need to recommend this once again as long as she has no bleeding issues.  She would be at increased stroke risk, CHADSVASc at least 4. - I will nonetheless check a 30-day event monitor to ensure that we do not pick up on any atrial fibrillation.  If we do, anticoagulate.  Shortness of breath -Previously secondary to bronchitis - Echocardiogram reassuring.  Stress test reassuring.  2019.  Chronic diastolic heart failure - She has been labeled as such, most likely because of an elevated BNP at one point.  She was diuresed mildly in the hospital and her Lasix was slightly increased to 20 mg twice a day at home.  This seems reasonable as long as her renal function remains fairly stable.  We will see  her back in 1 year.   Medication Adjustments/Labs and Tests Ordered: Current medicines are reviewed at length with the patient today.  Concerns regarding medicines are outlined above.  Orders Placed This Encounter  Procedures  . CARDIAC EVENT MONITOR   No orders of the defined types were placed in this encounter.   Patient Instructions  Medication Instructions:  The current medical regimen is effective;  continue present plan and medications.  If you need a refill on your cardiac medications before your next appointment, please call your pharmacy.   Testing/Procedures: Your  physician has recommended that you wear an event monitor. Event monitors are medical devices that record the heart's electrical activity. Doctors most often [place] these monitors to diagnose arrhythmias. Arrhythmias are problems with the speed or rhythm of the heartbeat. The monitor is a small, portable device. You can wear one while you do your normal daily activities. This is usually used to diagnose what is causing palpitations/syncope (passing out).  Follow-Up: At [facility], you and your health needs are our priority.  As part of our continuing mission to provide you with exceptional heart care, we have created designated Provider Care Teams.  These Care Teams include your primary Cardiologist (physician) and Advanced Practice Providers (APPs -  Physician Assistants and Nurse Practitioners) who all work together to provide you with the care you need, when you need it. You will need a follow up appointment in 12 months.  Please call our office 2 months in advance to schedule this appointment.  You may see Candee Furbish, MD or one of the following Advanced Practice Providers on your designated Care Team:   Truitt Merle, NP Cecilie Kicks, NP . Kathyrn Drown, NP  Thank you for choosing [facility]!!         Signed, Candee Furbish, MD  11/01/2018 3:45 PM    [place]

## 2018-11-01 NOTE — Patient Instructions (Signed)
Medication Instructions:  The current medical regimen is effective;  continue present plan and medications.  If you need a refill on your cardiac medications before your next appointment, please call your pharmacy.   Testing/Procedures: Your physician has recommended that you wear an event monitor. Event monitors are medical devices that record the heart's electrical activity. Doctors most often [place] these monitors to diagnose arrhythmias. Arrhythmias are problems with the speed or rhythm of the heartbeat. The monitor is a small, portable device. You can wear one while you do your normal daily activities. This is usually used to diagnose what is causing palpitations/syncope (passing out).  Follow-Up: At [facility], you and your health needs are our priority.  As part of our continuing mission to provide you with exceptional heart care, we have created designated Provider Care Teams.  These Care Teams include your primary Cardiologist (physician) and Advanced Practice Providers (APPs -  Physician Assistants and Nurse Practitioners) who all work together to provide you with the care you need, when you need it. You will need a follow up appointment in 12 months.  Please call our office 2 months in advance to schedule this appointment.  You may see Candee Furbish, MD or one of the following Advanced Practice Providers on your designated Care Team:   Truitt Merle, NP Cecilie Kicks, NP . Kathyrn Drown, NP  Thank you for choosing [facility]!!

## 2018-11-04 ENCOUNTER — Other Ambulatory Visit: Payer: Self-pay | Admitting: Cardiology

## 2018-11-07 ENCOUNTER — Ambulatory Visit (INDEPENDENT_AMBULATORY_CARE_PROVIDER_SITE_OTHER): Payer: Medicare Other | Admitting: Sports Medicine

## 2018-11-07 ENCOUNTER — Encounter: Payer: Self-pay | Admitting: Sports Medicine

## 2018-11-07 DIAGNOSIS — M79675 Pain in left toe(s): Secondary | ICD-10-CM | POA: Diagnosis not present

## 2018-11-07 DIAGNOSIS — M79671 Pain in right foot: Secondary | ICD-10-CM

## 2018-11-07 DIAGNOSIS — E1142 Type 2 diabetes mellitus with diabetic polyneuropathy: Secondary | ICD-10-CM

## 2018-11-07 DIAGNOSIS — B351 Tinea unguium: Secondary | ICD-10-CM | POA: Diagnosis not present

## 2018-11-07 DIAGNOSIS — M79674 Pain in right toe(s): Secondary | ICD-10-CM

## 2018-11-07 DIAGNOSIS — M79672 Pain in left foot: Secondary | ICD-10-CM

## 2018-11-07 NOTE — Progress Notes (Signed)
Patient ID: Deborah Weeks, female   DOB: [birth year], 73 y.o.   MRN: [id number] Subjective: Deborah Weeks is a 73 y.o. female patient seen today in office for diabetic foot check and nail trim.  Reports that her blood sugar was not checked and states that she has been at the hospital with pneumonia.  Patient states that she is feeling much better after being discharged denies any increased shortness of breath chest pain any increased lower extremity swelling.  Patient states that she is very poor p.o. to help with that and that the areas are improving.  Denies calf pain or any changes with numbness tingling or burning to feet or lower legs.  Patient Active Problem List   Diagnosis Date Noted  . Abnormal urinalysis 10/15/2018  . Hypokalemia 10/15/2018  . Chest pain 10/15/2018  . Acute respiratory failure with hypoxia ([place]) 10/14/2018  . Recurrent major depressive disorder, in full remission ([place]) 10/28/2014  . Multinodular goiter 08/30/2012  . Allergic rhinitis 05/30/2012  . Type 2 diabetes mellitus ([place]) 01/05/2011  . Controlled type 2 diabetes mellitus without complication ([place]) 22/97/9892  . BP (high blood pressure) 01/04/2011  . Osteopenia 12/07/2010  . Glaucoma 02/27/2010  . 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 Systemic lupus erythematosus ([place]) 02/27/2010    Current Outpatient Medications on File Prior to Visit  Medication Sig Dispense Refill  . acetaminophen (TYLENOL) 650 MG CR tablet Take 1,300 mg by mouth every 8 (eight) hours as needed for pain.    Marland Kitchen albuterol (PROVENTIL HFA;VENTOLIN HFA) 108 (90 Base) MCG/ACT inhaler Inhale 2 puffs every 4 (four) hours as needed into the lungs for wheezing or shortness of breath. 1 Inhaler 0  . amLODipine (NORVASC) 10 MG tablet Take 1 tablet (10 mg total) by mouth daily. 30 tablet 0  . aspirin 81 MG tablet Take 81 mg by mouth daily.    . carvedilol (COREG) 25 MG tablet Take 25 mg by mouth 2 (two) times daily.     . diclofenac (VOLTAREN)  75 MG EC tablet Take 75 mg by mouth 2 (two) times daily as needed.     Marland Kitchen escitalopram (LEXAPRO) 20 MG tablet Take 20 mg by mouth daily.    . furosemide (LASIX) 20 MG tablet Take 1 tablet (20 mg total) by mouth 2 (two) times daily. 180 tablet 3  . LYRICA 75 MG capsule Take 75 mg by mouth 3 (three) times daily.  1  . metFORMIN (GLUCOPHAGE) 1000 MG tablet Take 500 mg by mouth 2 (two) times daily with a meal.     . mirtazapine (REMERON) 15 MG tablet Take 7.5 mg by mouth at bedtime.    . pravastatin (PRAVACHOL) 10 MG tablet Take 10 mg by mouth daily. Takes instead of Welchol     Current Facility-Administered Medications on File Prior to Visit  Medication Dose Route Frequency Provider Last Rate Last Dose  . triamcinolone acetonide (KENALOG) 10 MG/ML injection 10 mg  10 mg Other Once Landis Martins, DPM        Allergies  Allergen Reactions  . Atorvastatin Other (See Comments)    cramps  . Escitalopram Oxalate Other (See Comments)    Insomnia  . Codeine Nausea And Vomiting  . Niacin And Related Itching and Rash    Objective: Physical Exam  General: Well developed, nourished, no acute distress, awake, alert and oriented x 3  Vascular: Dorsalis pedis artery 2/4 bilateral, Posterior tibial artery 1/4 bilateral, skin temperature warm to warm proximal to distal  bilateral lower extremities, no varicosities, pedal hair present bilateral.  Trace edema bilateral.  Neurological: Gross sensation present via light touch bilateral. Protective intact bilateral. Vibratory diminished bilateral. Subjective burning to toes bilateral on Lyrica like.   Dermatological: Skin is warm, dry, and supple bilateral, Nails 1-10 are elonagted thick, and discolored with mild subungal debris with most involved nails bilateral hallux with no acute ingrowing noted, no webspace macerations present bilateral, no open lesions present bilateral, no callus/corns/hyperkeratotic tissue present bilateral. No signs of infection  bilateral.  Musculoskeletal: No symptomatic boney deformities noted bilateral. Muscular strength within normal limits without pain on range of motion. No pain with calf compression bilateral.   Assessment and Plan:  Problem List Items Addressed This Visit    None    Visit Diagnoses    Pain due to onychomycosis of toenails of both feet    -  Primary   Diabetic polyneuropathy associated with type 2 diabetes mellitus (HCC)       Foot pain, bilateral          -Examined patient -Nails x10  Debrided with sterile nail nipper without incident   -Continue with PCP follow up in medical management after discharge from hospital for pneumonia and Lasix for fluid management -Patient to return in  2.5 to 3 months for follow up evaluation/Diabetic nail care or sooner if symptoms worsen.  Landis Martins, DPM

## 2018-11-21 ENCOUNTER — Ambulatory Visit (INDEPENDENT_AMBULATORY_CARE_PROVIDER_SITE_OTHER): Payer: Medicare Other

## 2018-11-21 DIAGNOSIS — I480 Paroxysmal atrial fibrillation: Secondary | ICD-10-CM | POA: Diagnosis not present

## 2018-12-22 ENCOUNTER — Other Ambulatory Visit: Payer: Self-pay | Admitting: Family Medicine

## 2018-12-22 ENCOUNTER — Ambulatory Visit
Admission: RE | Admit: 2018-12-22 | Discharge: 2018-12-22 | Disposition: A | Discharge status: Home / Self Care | Payer: Medicare Other | Source: Ambulatory Visit | Attending: Family Medicine | Admitting: Family Medicine

## 2018-12-22 ENCOUNTER — Other Ambulatory Visit: Payer: Self-pay

## 2018-12-22 DIAGNOSIS — I509 Heart failure, unspecified: Secondary | ICD-10-CM

## 2019-01-16 ENCOUNTER — Ambulatory Visit (INDEPENDENT_AMBULATORY_CARE_PROVIDER_SITE_OTHER): Payer: Medicare Other | Admitting: Sports Medicine

## 2019-01-16 ENCOUNTER — Other Ambulatory Visit: Payer: Self-pay

## 2019-01-16 ENCOUNTER — Encounter: Payer: Self-pay | Admitting: Sports Medicine

## 2019-01-16 VITALS — Temp 97.7°F

## 2019-01-16 DIAGNOSIS — E1142 Type 2 diabetes mellitus with diabetic polyneuropathy: Secondary | ICD-10-CM | POA: Diagnosis not present

## 2019-01-16 DIAGNOSIS — B351 Tinea unguium: Secondary | ICD-10-CM | POA: Diagnosis not present

## 2019-01-16 DIAGNOSIS — M79672 Pain in left foot: Secondary | ICD-10-CM

## 2019-01-16 DIAGNOSIS — M79674 Pain in right toe(s): Secondary | ICD-10-CM

## 2019-01-16 DIAGNOSIS — M79671 Pain in right foot: Secondary | ICD-10-CM

## 2019-01-16 DIAGNOSIS — M79675 Pain in left toe(s): Secondary | ICD-10-CM

## 2019-01-16 NOTE — Progress Notes (Signed)
Patient ID: Deborah Weeks, female   DOB: [birth year], 73 y.o.   MRN: [id number] Subjective: Deborah Weeks is a 73 y.o. female patient seen today in office for diabetic foot check and nail trim.  Reports that her blood sugar was not checked today. Last A1c 6.1 (10/16/18). Reports that when she had PNA in Jan. She thinks she may have had Pope then and was missed diagnosed. Denies any new medications or problems since last visit.   Patient Active Problem List   Diagnosis Date Noted  . Abnormal urinalysis 10/15/2018  . Hypokalemia 10/15/2018  . Chest pain 10/15/2018  . Acute respiratory failure with hypoxia ([place]) 10/14/2018  . Recurrent major depressive disorder, in full remission ([place]) 10/28/2014  . Multinodular goiter 08/30/2012  . Allergic rhinitis 05/30/2012  . Type 2 diabetes mellitus ([place]) 01/05/2011  . Controlled type 2 diabetes mellitus without complication ([place]) 87/86/7672  . BP (high blood pressure) 01/04/2011  . Osteopenia 12/07/2010  . Glaucoma 02/27/2010  . 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 Systemic lupus erythematosus ([place]) 02/27/2010    Current Outpatient Medications on File Prior to Visit  Medication Sig Dispense Refill  . acetaminophen (TYLENOL) 650 MG CR tablet Take 1,300 mg by mouth every 8 (eight) hours as needed for pain.    Marland Kitchen albuterol (PROVENTIL HFA;VENTOLIN HFA) 108 (90 Base) MCG/ACT inhaler Inhale 2 puffs every 4 (four) hours as needed into the lungs for wheezing or shortness of breath. 1 Inhaler 0  . amLODipine (NORVASC) 10 MG tablet Take 1 tablet (10 mg total) by mouth daily. 30 tablet 0  . aspirin 81 MG tablet Take 81 mg by mouth daily.    . carvedilol (COREG) 25 MG tablet Take 25 mg by mouth 2 (two) times daily.     . diclofenac (VOLTAREN) 75 MG EC tablet Take 75 mg by mouth 2 (two) times daily as needed.     Marland Kitchen escitalopram (LEXAPRO) 20 MG tablet Take 20 mg by mouth daily.    . furosemide (LASIX) 20 MG tablet Take 1 tablet (20 mg total) by mouth 2  (two) times daily. 180 tablet 3  . LYRICA 75 MG capsule Take 75 mg by mouth 3 (three) times daily.  1  . metFORMIN (GLUCOPHAGE) 1000 MG tablet Take 500 mg by mouth 2 (two) times daily with a meal.     . mirtazapine (REMERON) 15 MG tablet Take 7.5 mg by mouth at bedtime.    . pravastatin (PRAVACHOL) 10 MG tablet Take 10 mg by mouth daily. Takes instead of Welchol     Current Facility-Administered Medications on File Prior to Visit  Medication Dose Route Frequency Provider Last Rate Last Dose  . triamcinolone acetonide (KENALOG) 10 MG/ML injection 10 mg  10 mg Other Once Landis Martins, DPM        Allergies  Allergen Reactions  . Atorvastatin Other (See Comments)    cramps  . Escitalopram Oxalate Other (See Comments)    Insomnia  . Codeine Nausea And Vomiting  . Niacin And Related Itching and Rash    Objective: Physical Exam  General: Well developed, nourished, no acute distress, awake, alert and oriented x 3  Vascular: Dorsalis pedis artery 2/4 bilateral, Posterior tibial artery 1/4 bilateral, skin temperature warm to warm proximal to distal bilateral lower extremities, no varicosities, pedal hair present bilateral.  Trace edema bilateral.  Neurological: Gross sensation present via light touch bilateral. Protective intact bilateral. Vibratory diminished bilateral. Subjective burning to toes bilateral on Lyrica like noted  before.   Dermatological: Skin is warm, dry, and supple bilateral, Nails 1-10 are elonagted thick, and discolored with mild subungal debris with most involved nails bilateral hallux with no acute ingrowing noted, no webspace macerations present bilateral, no open lesions present bilateral, no callus/corns/hyperkeratotic tissue present bilateral. No signs of infection bilateral.  Musculoskeletal: No symptomatic boney deformities noted bilateral. Muscular strength within normal limits without pain on range of motion. No pain with calf compression  bilateral.   Assessment and Plan:  Problem List Items Addressed This Visit    None    Visit Diagnoses    Pain due to onychomycosis of toenails of both feet    -  Primary   Diabetic polyneuropathy associated with type 2 diabetes mellitus (HCC)       Foot pain, bilateral          -Examined patient -Nails x10  Debrided with sterile nail nipper without incident   -Continue with daily inspection of feet in the setting of diabetes  -Patient to return in  2.5 to 3 months for follow up evaluation/Diabetic nail care or sooner if symptoms worsen.  Landis Martins, DPM

## 2019-01-22 ENCOUNTER — Other Ambulatory Visit: Payer: Self-pay | Admitting: Sports Medicine

## 2019-03-27 ENCOUNTER — Encounter: Payer: Self-pay | Admitting: Sports Medicine

## 2019-03-27 ENCOUNTER — Ambulatory Visit (INDEPENDENT_AMBULATORY_CARE_PROVIDER_SITE_OTHER): Payer: Medicare Other | Admitting: Sports Medicine

## 2019-03-27 ENCOUNTER — Other Ambulatory Visit: Payer: Self-pay

## 2019-03-27 VITALS — Temp 97.1°F

## 2019-03-27 DIAGNOSIS — M79674 Pain in right toe(s): Secondary | ICD-10-CM | POA: Diagnosis not present

## 2019-03-27 DIAGNOSIS — B351 Tinea unguium: Secondary | ICD-10-CM | POA: Diagnosis not present

## 2019-03-27 DIAGNOSIS — M79675 Pain in left toe(s): Secondary | ICD-10-CM | POA: Diagnosis not present

## 2019-03-27 DIAGNOSIS — E1142 Type 2 diabetes mellitus with diabetic polyneuropathy: Secondary | ICD-10-CM | POA: Diagnosis not present

## 2019-03-27 NOTE — Progress Notes (Signed)
Patient ID: Deborah Weeks, female   DOB: January 05, 1946, 74 y.o.   MRN: [id number] Subjective: Deborah Weeks is a 73 y.o. female patient seen today in office for diabetic foot check and nail trim.  Reports that her blood sugar was 119 yesterday and last A1C 6.1. Patient states that she is doing fine no other pedal complaints.  Patient Active Problem List   Diagnosis Date Noted  . Abnormal urinalysis 10/15/2018  . Hypokalemia 10/15/2018  . Chest pain 10/15/2018  . Acute respiratory failure with hypoxia ([place]) 10/14/2018  . Recurrent major depressive disorder, in full remission ([place]) 10/28/2014  . Multinodular goiter 08/30/2012  . Allergic rhinitis 05/30/2012  . Type 2 diabetes mellitus ([place]) 01/05/2011  . Controlled type 2 diabetes mellitus without complication ([place]) 11/94/1740  . BP (high blood pressure) 01/04/2011  . Osteopenia 12/07/2010  . Glaucoma 02/27/2010  . 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 Systemic lupus erythematosus ([place]) 02/27/2010    Current Outpatient Medications on File Prior to Visit  Medication Sig Dispense Refill  . acetaminophen (TYLENOL) 650 MG CR tablet Take 1,300 mg by mouth every 8 (eight) hours as needed for pain.    Marland Kitchen albuterol (PROVENTIL HFA;VENTOLIN HFA) 108 (90 Base) MCG/ACT inhaler Inhale 2 puffs every 4 (four) hours as needed into the lungs for wheezing or shortness of breath. 1 Inhaler 0  . amLODipine (NORVASC) 10 MG tablet Take 1 tablet (10 mg total) by mouth daily. 30 tablet 0  . aspirin 81 MG tablet Take 81 mg by mouth daily.    . carvedilol (COREG) 25 MG tablet Take 25 mg by mouth 2 (two) times daily.     . diclofenac (VOLTAREN) 75 MG EC tablet TAKE 1 TABLET BY MOUTH TWICE A DAY 90 tablet 5  . escitalopram (LEXAPRO) 20 MG tablet Take 20 mg by mouth daily.    . furosemide (LASIX) 20 MG tablet Take 1 tablet (20 mg total) by mouth 2 (two) times daily. 180 tablet 3  . LYRICA 75 MG capsule Take 75 mg by mouth 3 (three) times daily.  1  . metFORMIN  (GLUCOPHAGE) 1000 MG tablet Take 500 mg by mouth 2 (two) times daily with a meal.     . mirtazapine (REMERON) 15 MG tablet Take 7.5 mg by mouth at bedtime.    . pravastatin (PRAVACHOL) 10 MG tablet Take 10 mg by mouth daily. Takes instead of Welchol     Current Facility-Administered Medications on File Prior to Visit  Medication Dose Route Frequency Provider Last Rate Last Dose  . triamcinolone acetonide (KENALOG) 10 MG/ML injection 10 mg  10 mg Other Once Landis Martins, DPM        Allergies  Allergen Reactions  . Atorvastatin Other (See Comments)    cramps  . Escitalopram Oxalate Other (See Comments)    Insomnia  . Codeine Nausea And Vomiting  . Niacin And Related Itching and Rash    Objective: Physical Exam  General: Well developed, nourished, no acute distress, awake, alert and oriented x 3  Vascular: Dorsalis pedis artery 2/4 bilateral, Posterior tibial artery 1/4 bilateral, skin temperature warm to warm proximal to distal bilateral lower extremities, no varicosities, pedal hair present bilateral.  Trace edema bilateral.  Neurological: Gross sensation present via light touch bilateral. Protective intact bilateral. Vibratory diminished bilateral. Subjective burning to toes bilateral on Lyrica like.   Dermatological: Skin is warm, dry, and supple bilateral, Nails 1-10 are elonagted thick, and discolored with mild subungal debris with most involved  nails bilateral hallux with no acute ingrowing noted, no webspace macerations present bilateral, no open lesions present bilateral, no callus/corns/hyperkeratotic tissue present bilateral. No signs of infection bilateral.  Musculoskeletal: No symptomatic boney deformities noted bilateral. Muscular strength within normal limits without pain on range of motion. No pain with calf compression bilateral.   Assessment and Plan:  Problem List Items Addressed This Visit    None    Visit Diagnoses    Pain due to onychomycosis of toenails of  both feet    -  Primary   Diabetic polyneuropathy associated with type 2 diabetes mellitus ([place])          -Examined patient -Nails x10  Debrided with sterile nail nipper without incident   -Continue with PCP follow up in medical management for neuropathy and trace edema -Patient to return in  2.5 to 3 months for follow up evaluation/Diabetic nail care or sooner if symptoms worsen.  Landis Martins, DPM

## 2019-05-22 ENCOUNTER — Other Ambulatory Visit: Payer: Self-pay | Admitting: Family Medicine

## 2019-05-22 DIAGNOSIS — Z1231 Encounter for screening mammogram for malignant neoplasm of breast: Secondary | ICD-10-CM

## 2019-06-05 ENCOUNTER — Ambulatory Visit (INDEPENDENT_AMBULATORY_CARE_PROVIDER_SITE_OTHER): Payer: Medicare Other | Admitting: Sports Medicine

## 2019-06-05 ENCOUNTER — Encounter: Payer: Self-pay | Admitting: Sports Medicine

## 2019-06-05 ENCOUNTER — Other Ambulatory Visit: Payer: Self-pay

## 2019-06-05 VITALS — Temp 98.3°F

## 2019-06-05 DIAGNOSIS — E1142 Type 2 diabetes mellitus with diabetic polyneuropathy: Secondary | ICD-10-CM

## 2019-06-05 DIAGNOSIS — M79674 Pain in right toe(s): Secondary | ICD-10-CM | POA: Diagnosis not present

## 2019-06-05 DIAGNOSIS — M79675 Pain in left toe(s): Secondary | ICD-10-CM | POA: Diagnosis not present

## 2019-06-05 DIAGNOSIS — M79672 Pain in left foot: Secondary | ICD-10-CM

## 2019-06-05 DIAGNOSIS — B351 Tinea unguium: Secondary | ICD-10-CM | POA: Diagnosis not present

## 2019-06-05 DIAGNOSIS — M79671 Pain in right foot: Secondary | ICD-10-CM

## 2019-06-05 NOTE — Progress Notes (Signed)
Patient ID: Deborah Weeks, female   DOB: 1945/10/16, 73 y.o.   MRN: [id number] Subjective: Deborah Weeks is a 73 y.o. female patient seen today in office for diabetic foot check and nail trim.  Reports that her blood sugar was 147 yesterday higher than normal and last A1C 6.1. Patient states that she is doing fine no other pedal complaints.  Patient Active Problem List   Diagnosis Date Noted  . Abnormal urinalysis 10/15/2018  . Hypokalemia 10/15/2018  . Chest pain 10/15/2018  . Acute respiratory failure with hypoxia ([place]) 10/14/2018  . Recurrent major depressive disorder, in full remission ([place]) 10/28/2014  . Multinodular goiter 08/30/2012  . Allergic rhinitis 05/30/2012  . Type 2 diabetes mellitus ([place]) 01/05/2011  . Controlled type 2 diabetes mellitus without complication ([place]) 08/04/8526  . BP (high blood pressure) 01/04/2011  . Osteopenia 12/07/2010  . Glaucoma 02/27/2010  . 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 Systemic lupus erythematosus ([place]) 02/27/2010    Current Outpatient Medications on File Prior to Visit  Medication Sig Dispense Refill  . acetaminophen (TYLENOL) 650 MG CR tablet Take 1,300 mg by mouth every 8 (eight) hours as needed for pain.    Marland Kitchen albuterol (PROVENTIL HFA;VENTOLIN HFA) 108 (90 Base) MCG/ACT inhaler Inhale 2 puffs every 4 (four) hours as needed into the lungs for wheezing or shortness of breath. 1 Inhaler 0  . amLODipine (NORVASC) 10 MG tablet Take 1 tablet (10 mg total) by mouth daily. 30 tablet 0  . aspirin 81 MG tablet Take 81 mg by mouth daily.    . Blood Glucose Monitoring Suppl (ONETOUCH VERIO FLEX SYSTEM) w/Device KIT USE TO CHECK BLOOD SUGAR TWICE A DAY    . carvedilol (COREG) 25 MG tablet Take 25 mg by mouth 2 (two) times daily.     . diclofenac (VOLTAREN) 75 MG EC tablet TAKE 1 TABLET BY MOUTH TWICE A DAY 90 tablet 5  . escitalopram (LEXAPRO) 20 MG tablet Take 20 mg by mouth daily.    . furosemide (LASIX) 20 MG tablet Take 1 tablet (20 mg  total) by mouth 2 (two) times daily. 180 tablet 3  . Lancets (ONETOUCH DELICA PLUS [id number]) MISC USE TO CHECK BLOOD SUGAR THREE TIMES A DAY    . LYRICA 75 MG capsule Take 75 mg by mouth 3 (three) times daily.  1  . metFORMIN (GLUCOPHAGE) 1000 MG tablet Take 500 mg by mouth 2 (two) times daily with a meal.     . mirtazapine (REMERON) 15 MG tablet Take 7.5 mg by mouth at bedtime.    Glory Rosebush VERIO test strip USE TO CHECK BLOOD SUGAR THREE TIMES A DAY    . pravastatin (PRAVACHOL) 10 MG tablet Take 10 mg by mouth daily. Takes instead of Welchol     Current Facility-Administered Medications on File Prior to Visit  Medication Dose Route Frequency Provider Last Rate Last Dose  . triamcinolone acetonide (KENALOG) 10 MG/ML injection 10 mg  10 mg Other Once Landis Martins, DPM        Allergies  Allergen Reactions  . Atorvastatin Other (See Comments)    cramps  . Escitalopram Oxalate Other (See Comments)    Insomnia  . Codeine Nausea And Vomiting  . Niacin And Related Itching and Rash    Objective: Physical Exam  General: Well developed, nourished, no acute distress, awake, alert and oriented x 3  Vascular: Dorsalis pedis artery 2/4 bilateral, Posterior tibial artery 1/4 bilateral, skin temperature warm to warm proximal to  distal bilateral lower extremities, no varicosities, pedal hair present bilateral.  Trace edema bilateral.  Neurological: Gross sensation present via light touch bilateral. Protective intact bilateral. Vibratory diminished bilateral. Subjective burning to toes bilateral on Lyrica like.   Dermatological: Skin is warm, dry, and supple bilateral, Nails 1-10 are elonagted thick, and discolored with mild subungal debris with most involved nails bilateral hallux with no acute ingrowing noted, no webspace macerations present bilateral, no open lesions present bilateral, no callus/corns/hyperkeratotic tissue present bilateral. No signs of infection bilateral.  Musculoskeletal:  No symptomatic boney deformities noted bilateral. Muscular strength within normal limits without pain on range of motion. No pain with calf compression bilateral.   Assessment and Plan:  Problem List Items Addressed This Visit    None    Visit Diagnoses    Pain due to onychomycosis of toenails of both feet    -  Primary   Diabetic polyneuropathy associated with type 2 diabetes mellitus (HCC)       Foot pain, bilateral          -Examined patient -Nails x10  Debrided with sterile nail nipper without incident   -Continue with PCP follow up in medical management for neuropathy and trace edema -Patient to return in  2.5 to 3 months for follow up evaluation/Diabetic nail care or sooner if symptoms worsen.  Landis Martins, DPM

## 2019-07-09 ENCOUNTER — Ambulatory Visit
Admission: RE | Admit: 2019-07-09 | Discharge: 2019-07-09 | Disposition: A | Discharge status: Home / Self Care | Payer: Medicare Other | Source: Ambulatory Visit | Attending: Family Medicine | Admitting: Family Medicine

## 2019-07-09 ENCOUNTER — Other Ambulatory Visit: Payer: Self-pay

## 2019-07-09 DIAGNOSIS — Z1231 Encounter for screening mammogram for malignant neoplasm of breast: Secondary | ICD-10-CM

## 2019-08-14 ENCOUNTER — Ambulatory Visit (INDEPENDENT_AMBULATORY_CARE_PROVIDER_SITE_OTHER): Payer: Medicare Other | Admitting: Sports Medicine

## 2019-08-14 ENCOUNTER — Encounter: Payer: Self-pay | Admitting: Sports Medicine

## 2019-08-14 ENCOUNTER — Other Ambulatory Visit: Payer: Self-pay

## 2019-08-14 DIAGNOSIS — E1142 Type 2 diabetes mellitus with diabetic polyneuropathy: Secondary | ICD-10-CM

## 2019-08-14 DIAGNOSIS — M79675 Pain in left toe(s): Secondary | ICD-10-CM

## 2019-08-14 DIAGNOSIS — M79671 Pain in right foot: Secondary | ICD-10-CM | POA: Diagnosis not present

## 2019-08-14 DIAGNOSIS — M79674 Pain in right toe(s): Secondary | ICD-10-CM

## 2019-08-14 DIAGNOSIS — B351 Tinea unguium: Secondary | ICD-10-CM

## 2019-08-14 DIAGNOSIS — M79672 Pain in left foot: Secondary | ICD-10-CM

## 2019-08-14 NOTE — Progress Notes (Signed)
Patient ID: Deborah Weeks, female   DOB: [birth year], 73 y.o.   MRN: [id number] Subjective: Deborah Weeks is a 73 y.o. female patient seen today in office for diabetic foot check and nail trim.  Reports that her blood sugar was 149 this morning which is about average and last A1C 6.1. Patient states that she is doing fine like before with no other pedal complaints.  Patient Active Problem List   Diagnosis Date Noted  . Abnormal urinalysis 10/15/2018  . Hypokalemia 10/15/2018  . Chest pain 10/15/2018  . Acute respiratory failure with hypoxia ([place]) 10/14/2018  . Recurrent major depressive disorder, in full remission ([place]) 10/28/2014  . Multinodular goiter 08/30/2012  . Allergic rhinitis 05/30/2012  . Type 2 diabetes mellitus ([place]) 01/05/2011  . Controlled type 2 diabetes mellitus without complication ([place]) 67/54/4920  . BP (high blood pressure) 01/04/2011  . Osteopenia 12/07/2010  . Glaucoma 02/27/2010  . 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 Systemic lupus erythematosus ([place]) 02/27/2010    Current Outpatient Medications on File Prior to Visit  Medication Sig Dispense Refill  . acetaminophen (TYLENOL) 650 MG CR tablet Take 1,300 mg by mouth every 8 (eight) hours as needed for pain.    Marland Kitchen albuterol (PROVENTIL HFA;VENTOLIN HFA) 108 (90 Base) MCG/ACT inhaler Inhale 2 puffs every 4 (four) hours as needed into the lungs for wheezing or shortness of breath. 1 Inhaler 0  . amLODipine (NORVASC) 10 MG tablet Take 1 tablet (10 mg total) by mouth daily. 30 tablet 0  . aspirin 81 MG tablet Take 81 mg by mouth daily.    . Blood Glucose Monitoring Suppl (ONETOUCH VERIO FLEX SYSTEM) w/Device KIT USE TO CHECK BLOOD SUGAR TWICE A DAY    . carvedilol (COREG) 25 MG tablet Take 25 mg by mouth 2 (two) times daily.     . diclofenac (VOLTAREN) 75 MG EC tablet TAKE 1 TABLET BY MOUTH TWICE A DAY 90 tablet 5  . escitalopram (LEXAPRO) 20 MG tablet Take 20 mg by mouth daily.    . furosemide  (LASIX) 20 MG tablet Take 1 tablet (20 mg total) by mouth 2 (two) times daily. 180 tablet 3  . Lancets (ONETOUCH DELICA PLUS [id number]) MISC USE TO CHECK BLOOD SUGAR THREE TIMES A DAY    . LYRICA 75 MG capsule Take 75 mg by mouth 3 (three) times daily.  1  . metFORMIN (GLUCOPHAGE) 1000 MG tablet Take 500 mg by mouth 2 (two) times daily with a meal.     . mirtazapine (REMERON) 15 MG tablet Take 7.5 mg by mouth at bedtime.    Glory Rosebush VERIO test strip USE TO CHECK BLOOD SUGAR THREE TIMES A DAY    . pravastatin (PRAVACHOL) 10 MG tablet Take 10 mg by mouth daily. Takes instead of Welchol     Current Facility-Administered Medications on File Prior to Visit  Medication Dose Route Frequency Provider Last Rate Last Dose  . triamcinolone acetonide (KENALOG) 10 MG/ML injection 10 mg  10 mg Other Once Landis Martins, DPM        Allergies  Allergen Reactions  . Atorvastatin Other (See Comments)    cramps  . Escitalopram Oxalate Other (See Comments)    Insomnia  . Codeine Nausea And Vomiting  . Niacin And Related Itching and Rash    Objective: Physical Exam  General: Well developed, nourished, no acute distress, awake, alert and oriented x 3  Vascular: Dorsalis pedis artery 2/4 bilateral, Posterior tibial artery 1/4 bilateral,  skin temperature warm to warm proximal to distal bilateral lower extremities, no varicosities, pedal hair present bilateral.  Trace edema bilateral.  Neurological: Gross sensation present via light touch bilateral. Protective intact bilateral. Vibratory diminished bilateral. Subjective burning to toes bilateral on Lyrica like before.   Dermatological: Skin is warm, dry, and supple bilateral, Nails 1-10 are elonagted thick, and discolored with mild subungal debris with most involved nails bilateral hallux with no acute ingrowing noted, no webspace macerations present bilateral, no open lesions present bilateral, no callus/corns/hyperkeratotic tissue present bilateral. No  signs of infection bilateral.  Musculoskeletal: No symptomatic boney deformities noted bilateral. Muscular strength within normal limits without pain on range of motion. No pain with calf compression bilateral.   Assessment and Plan:  Problem List Items Addressed This Visit    None    Visit Diagnoses    Pain due to onychomycosis of toenails of both feet    -  Primary   Diabetic polyneuropathy associated with type 2 diabetes mellitus ([facility])       Foot pain, bilateral         -Examined patient -Re-discussed foot care in the setting of diabetes -Nails x10  Debrided with sterile nail nipper without incident   -Continue with PCP follow up in medical management for neuropathy and trace edema -Patient to return in  2.5 to 3 months for follow up evaluation/Diabetic nail care or sooner if symptoms worsen.  Landis Martins, DPM

## 2019-09-19 ENCOUNTER — Emergency Department (HOSPITAL_COMMUNITY): Payer: Medicare Other

## 2019-09-19 ENCOUNTER — Emergency Department (HOSPITAL_COMMUNITY)
Admission: EM | Admit: 2019-09-19 | Discharge: 2019-09-20 | Disposition: A | Discharge status: Home / Self Care | Payer: Medicare Other | Source: Home / Self Care | Attending: Emergency Medicine | Admitting: Emergency Medicine

## 2019-09-19 ENCOUNTER — Other Ambulatory Visit: Payer: Self-pay

## 2019-09-19 ENCOUNTER — Encounter (HOSPITAL_COMMUNITY): Payer: Self-pay | Admitting: Emergency Medicine

## 2019-09-19 DIAGNOSIS — R0602 Shortness of breath: Secondary | ICD-10-CM | POA: Insufficient documentation

## 2019-09-19 DIAGNOSIS — I472 Ventricular tachycardia: Secondary | ICD-10-CM | POA: Insufficient documentation

## 2019-09-19 DIAGNOSIS — I480 Paroxysmal atrial fibrillation: Secondary | ICD-10-CM | POA: Insufficient documentation

## 2019-09-19 DIAGNOSIS — Z7982 Long term (current) use of aspirin: Secondary | ICD-10-CM | POA: Insufficient documentation

## 2019-09-19 DIAGNOSIS — Z79899 Other long term (current) drug therapy: Secondary | ICD-10-CM | POA: Insufficient documentation

## 2019-09-19 DIAGNOSIS — Z7984 Long term (current) use of oral hypoglycemic drugs: Secondary | ICD-10-CM | POA: Insufficient documentation

## 2019-09-19 DIAGNOSIS — U071 COVID-19: Secondary | ICD-10-CM | POA: Insufficient documentation

## 2019-09-19 DIAGNOSIS — J9601 Acute respiratory failure with hypoxia: Secondary | ICD-10-CM | POA: Diagnosis not present

## 2019-09-19 DIAGNOSIS — J189 Pneumonia, unspecified organism: Secondary | ICD-10-CM | POA: Insufficient documentation

## 2019-09-19 DIAGNOSIS — Z87891 Personal history of nicotine dependence: Secondary | ICD-10-CM | POA: Insufficient documentation

## 2019-09-19 DIAGNOSIS — E119 Type 2 diabetes mellitus without complications: Secondary | ICD-10-CM | POA: Insufficient documentation

## 2019-09-19 DIAGNOSIS — I10 Essential (primary) hypertension: Secondary | ICD-10-CM | POA: Insufficient documentation

## 2019-09-19 LAB — CBC
HCT: 38.5 % (ref 36.0–46.0)
Hemoglobin: 12.1 g/dL (ref 12.0–15.0)
MCH: 25.4 pg — ABNORMAL LOW (ref 26.0–34.0)
MCHC: 31.4 g/dL (ref 30.0–36.0)
MCV: 80.9 fL (ref 80.0–100.0)
Platelets: 188 10*3/uL (ref 150–400)
RBC: 4.76 MIL/uL (ref 3.87–5.11)
RDW: 14.6 % (ref 11.5–15.5)
WBC: 4.1 10*3/uL (ref 4.0–10.5)
nRBC: 0 % (ref 0.0–0.2)

## 2019-09-19 LAB — BASIC METABOLIC PANEL
Anion gap: 12 (ref 5–15)
BUN: 22 mg/dL (ref 8–23)
CO2: 24 mmol/L (ref 22–32)
Calcium: 8.8 mg/dL — ABNORMAL LOW (ref 8.9–10.3)
Chloride: 104 mmol/L (ref 98–111)
Creatinine, Ser: 1.4 mg/dL — ABNORMAL HIGH (ref 0.44–1.00)
GFR calc Af Amer: 43 mL/min — ABNORMAL LOW (ref >60)
GFR calc non Af Amer: 37 mL/min — ABNORMAL LOW (ref >60)
Glucose, Bld: 196 mg/dL — ABNORMAL HIGH (ref 70–99)
Potassium: 4.2 mmol/L (ref 3.5–5.1)
Sodium: 140 mmol/L (ref 135–145)

## 2019-09-19 LAB — TROPONIN I (HIGH SENSITIVITY)
Troponin I (High Sensitivity): 19 ng/L — ABNORMAL HIGH (ref <18)
Troponin I (High Sensitivity): 17 ng/L (ref <18)

## 2019-09-19 NOTE — ED Triage Notes (Signed)
Pt reports "flu like symptoms" x 6 days.  States she had negative COVID test.  Body aches have resolved.  Denies Chest pain and SOB.  Reports dry cough that has now been bright red blood over the last 2 days with diarrhea and diaphoresis.  Sats 84 % on room air.

## 2019-09-20 MED ORDER — LEVOFLOXACIN 750 MG PO TABS: 750.0000 mg | ORAL_TABLET | Freq: Every day | ORAL | 0 refills | Status: DC

## 2019-09-20 MED ORDER — LEVOFLOXACIN IN D5W 750 MG/150ML IV SOLN
750.0000 mg | Freq: Once | INTRAVENOUS | Status: AC
  Administered 2019-09-20: 750 mg via INTRAVENOUS
  Filled 2019-09-20: qty 150

## 2019-09-20 NOTE — Discharge Instructions (Signed)
Begin taking Levaquin as prescribed.  Isolate yourself at home until the results of your Covid test are known.  This should be within the next 24 hours.  Usual home oxygen as needed for difficulty breathing.  Return to the emergency department if you develop severe chest pain, worsening breathing, or other new and concerning symptoms.

## 2019-09-20 NOTE — ED Provider Notes (Signed)
[facility] EMERGENCY DEPARTMENT Provider Note   CSN: [id number] Arrival date & time: 09/19/19  1658     History Chief Complaint  Patient presents with  . Low O2 sats  . Cough    Deborah Weeks is a 73 y.o. female.  Patient is a 73-year-old female with past medical history of paroxysmal A. fib, diabetes, lupus, and hospitalization 1 year ago for pneumonia with hypoxia.  She was discharged from the hospital on home oxygen and has used this as needed since.  She presents this evening with a 6-day history of cough that is occasionally productive of phlegm that is tinged with blood.  She denies any chest pain or fever.  Patient was tested for Covid 10 days ago and tells me her test was negative.  Patient was found to have oxygen saturations of 84% upon arrival to the ER.  She was placed on oxygen by 2 L nasal cannula and saturations are now in the mid 90s.  The history is provided by the patient.  Cough Cough characteristics:  Productive Sputum characteristics:  Bloody Severity:  Moderate Onset quality:  Gradual Duration:  6 days Timing:  Constant Progression:  Worsening Chronicity:  New Context: not upper respiratory infection   Relieved by:  Nothing Worsened by:  Nothing      Past Medical History:  Diagnosis Date  . Arthritis   . Diabetes mellitus without complication (HCC)   . High cholesterol   . Hypertension   . Lupus (HCC)   . Neuropathy     Patient Active Problem List   Diagnosis Date Noted  . Abnormal urinalysis 10/15/2018  . Hypokalemia 10/15/2018  . Chest pain 10/15/2018  . Acute respiratory failure with hypoxia (HCC) 10/14/2018  . Recurrent major depressive disorder, in full remission (HCC) 10/28/2014  . Multinodular goiter 08/30/2012  . Allergic rhinitis 05/30/2012  . Type 2 diabetes mellitus (HCC) 01/05/2011  . Controlled type 2 diabetes mellitus without complication (HCC) 01/04/2011  . BP (high blood pressure) 01/04/2011  .  Osteopenia 12/07/2010  . Glaucoma 02/27/2010  . 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 Systemic lupus erythematosus (HCC) 02/27/2010    History reviewed. No pertinent surgical history.   OB History   No obstetric history on file.     Family History  Problem Relation Age of Onset  . Breast cancer Cousin     Social History   Tobacco Use  . Smoking status: Former Smoker  . Smokeless tobacco: Never Used  Substance Use Topics  . Alcohol use: Never    Alcohol/week: 0.0 standard drinks  . Drug use: Never    Home Medications Prior to Admission medications   Medication Sig Start Date End Date Taking? Authorizing Provider  acetaminophen (TYLENOL) 650 MG CR tablet Take 1,300 mg by mouth every 8 (eight) hours as needed for pain.    [provider]  albuterol (PROVENTIL HFA;VENTOLIN HFA) 108 (90 Base) MCG/ACT inhaler Inhale 2 puffs every 4 (four) hours as needed into the lungs for wheezing or shortness of breath. 08/15/17   Mabe, David, NP  amLODipine (NORVASC) 10 MG tablet Take 1 tablet (10 mg total) by mouth daily. 10/21/18   Joseph, Preetha, MD  aspirin 81 MG tablet Take 81 mg by mouth daily.    [provider]  Blood Glucose Monitoring Suppl (ONETOUCH VERIO FLEX SYSTEM) w/Device KIT USE TO CHECK BLOOD SUGAR TWICE A DAY 05/23/19   [provider]  carvedilol (COREG) 25 MG tablet   Take 25 mg by mouth 2 (two) times daily.  05/26/15   [provider]  diclofenac (VOLTAREN) 75 MG EC tablet TAKE 1 TABLET BY MOUTH TWICE A DAY 01/22/19   Stover, Titorya, DPM  escitalopram (LEXAPRO) 20 MG tablet Take 20 mg by mouth daily.    [provider]  furosemide (LASIX) 20 MG tablet Take 1 tablet (20 mg total) by mouth 2 (two) times daily. 11/07/18   Jerline Pain, MD  Lancets (ONETOUCH DELICA PLUS [id number]) MISC USE TO CHECK BLOOD SUGAR THREE TIMES A DAY 05/25/19   [provider]  LYRICA 75 MG capsule Take 75 mg by mouth 3 (three) times daily.  02/24/18   [provider]  metFORMIN (GLUCOPHAGE) 1000 MG tablet Take 500 mg by mouth 2 (two) times daily with a meal.  09/01/15   [provider]  mirtazapine (REMERON) 15 MG tablet Take 7.5 mg by mouth at bedtime.    [provider]  ONETOUCH VERIO test strip USE TO CHECK BLOOD SUGAR THREE TIMES A DAY 05/24/19   [provider]  pravastatin (PRAVACHOL) 10 MG tablet Take 10 mg by mouth daily. Takes instead of Scientist, clinical (histocompatibility and immunogenetics), Historical, MD    Allergies    Atorvastatin, Escitalopram oxalate, Codeine, and Niacin and related  Review of Systems   Review of Systems  Respiratory: Positive for cough.   All other systems reviewed and are negative.   Physical Exam Updated Vital Signs BP 124/67 (BP Location: Left Arm)   Pulse 66   Temp 99.4 F (37.4 C) (Oral)   Resp 18   LMP  (LMP Unknown)   SpO2 98%   Physical Exam Vitals and nursing note reviewed.  Constitutional:      General: She is not in acute distress.    Appearance: She is well-developed. She is not diaphoretic.  HENT:     Head: Normocephalic and atraumatic.  Cardiovascular:     Rate and Rhythm: Normal rate and regular rhythm.     Heart sounds: No murmur. No friction rub. No gallop.   Pulmonary:     Effort: Pulmonary effort is normal. No respiratory distress.     Breath sounds: Normal breath sounds. No wheezing.  Abdominal:     General: Bowel sounds are normal. There is no distension.     Palpations: Abdomen is soft.     Tenderness: There is no abdominal tenderness.  Musculoskeletal:        General: Normal range of motion.     Cervical back: Normal range of motion and neck supple.  Skin:    General: Skin is warm and dry.  Neurological:     Mental Status: She is alert and oriented to person, place, and time.     ED Results / Procedures / Treatments   Labs (all labs ordered are listed, but only abnormal results are displayed) Labs Reviewed  BASIC METABOLIC PANEL - Abnormal;  Notable for the following components:      Result Value   Glucose, Bld 196 (*)    Creatinine, Ser 1.40 (*)    Calcium 8.8 (*)    GFR calc non Af Amer 37 (*)    GFR calc Af Amer 43 (*)    All other components within normal limits  CBC - Abnormal; Notable for the following components:   MCH 25.4 (*)    All other components within normal limits  TROPONIN I (HIGH SENSITIVITY) - Abnormal; Notable for the following components:  Troponin I (High Sensitivity) 19 (*)    All other components within normal limits  RESPIRATORY PANEL BY RT PCR (FLU A&B, COVID)  TROPONIN I (HIGH SENSITIVITY)    EKG EKG Interpretation  Date/Time:  Wednesday September 19 2019 17:16:20 EST Ventricular Rate:  70 PR Interval:  146 QRS Duration: 78 QT Interval:  418 QTC Calculation: 451 R Axis:   60 Text Interpretation: Normal sinus rhythm Septal infarct , age undetermined Abnormal ECG No STEMI Confirmed by Octaviano Glow [phone]) on 09/19/2019 7:12:38 PM   Radiology DG Chest Portable 1 View  Result Date: 09/19/2019 CLINICAL DATA:  Low sats. Additional provided: Patient reports dry cough that has now been bright red over the last 2 days with diarrhea and diaphoresis EXAM: PORTABLE CHEST 1 VIEW COMPARISON:  Chest radiograph 12/22/2018 FINDINGS: The heart is at the upper limits of normal for size, unchanged. Ill-defined opacity within the mid to lower left lung and at the right lung base. No sizable pleural effusion or evidence of pneumothorax. No acute bony abnormality IMPRESSION: Ill-defined opacity within the mid to lower left lung and right lung base, may reflect patchy edema or pneumonia. Radiographic follow-up to resolution recommended. Electronically Signed   By: Kellie Simmering DO   On: 09/19/2019 17:59    Procedures Procedures (including critical care time)  Medications Ordered in ED Medications - No data to display  ED Course  I have reviewed the triage vital signs and the nursing notes.  Pertinent labs  & imaging results that were available during my care of the patient were reviewed by me and considered in my medical decision making (see chart for details).    MDM Rules/Calculators/A&P     CHA2DS2/VAS Stroke Risk Points  Current as of 2 hours ago     5 >= 2 Points: High Risk  1 - 1.99 Points: Medium Risk  0 Points: Low Risk    The previous score was 4 on 11/01/2018.: Last Change:     Details    This score determines the patient's risk of having a stroke if the  patient has atrial fibrillation.       Points Metrics  1 Has Congestive Heart Failure:  Yes    Current as of 2 hours ago  0 Has Vascular Disease:  No    Current as of 2 hours ago  1 Has Hypertension:  Yes    Current as of 2 hours ago  1 Age:  24    Current as of 2 hours ago  1 Has Diabetes:  Yes    Current as of 2 hours ago  0 Had Stroke:  No  Had TIA:  No  Had thromboembolism:  No    Current as of 2 hours ago  1 Female:  Yes    Current as of 2 hours ago    Patient presenting with complaints of cough and shortness of breath.  She has history of pneumonia in the past and this presentation seems similar.  She has home oxygen that she wears as needed, but has been using more frequently.  Oxygen saturations on room air are mid to upper 80s, but improved with 2 L by nasal cannula into the 90s.  Her work-up today reveals no significant laboratory abnormality.  Her chest x-ray is suggestive of possible infiltrate.  Patient given intravenous Levaquin and will be discharged with Levaquin.  Covid testing is pending.  Patient was tested a little over 1 week ago and was negative.  Patient  also had a run of ventricular tachycardia while she was here in the ER.  This was approximately 15 beats long.  This finding was discussed with the cardiology fellow on-call.  He feels as though outpatient follow-up is appropriate.  Patient was basically asymptomatic during this episode and did not lose consciousness.  Final Clinical Impression(s)  / ED Diagnoses Final diagnoses:  None    Rx / DC Orders ED Discharge Orders    None       Veryl Speak, MD 09/20/19 [phone]

## 2019-09-21 LAB — SARS CORONAVIRUS 2 (TAT 6-24 HRS): SARS Coronavirus 2: POSITIVE — AB

## 2019-09-22 ENCOUNTER — Emergency Department (HOSPITAL_COMMUNITY): Payer: Medicare Other

## 2019-09-22 ENCOUNTER — Inpatient Hospital Stay (HOSPITAL_COMMUNITY)
Admission: EM | Admit: 2019-09-22 | Discharge: 2019-09-29 | DRG: 177 | Disposition: A | Discharge status: Home / Self Care | Payer: Medicare Other | Attending: Internal Medicine | Admitting: Internal Medicine

## 2019-09-22 ENCOUNTER — Encounter (HOSPITAL_COMMUNITY): Payer: Self-pay | Admitting: Internal Medicine

## 2019-09-22 ENCOUNTER — Other Ambulatory Visit: Payer: Self-pay

## 2019-09-22 DIAGNOSIS — J9601 Acute respiratory failure with hypoxia: Secondary | ICD-10-CM | POA: Diagnosis present

## 2019-09-22 DIAGNOSIS — N1832 Chronic kidney disease, stage 3b: Secondary | ICD-10-CM | POA: Diagnosis present

## 2019-09-22 DIAGNOSIS — J1289 Other viral pneumonia: Secondary | ICD-10-CM | POA: Diagnosis present

## 2019-09-22 DIAGNOSIS — F329 Major depressive disorder, single episode, unspecified: Secondary | ICD-10-CM | POA: Diagnosis present

## 2019-09-22 DIAGNOSIS — Z7982 Long term (current) use of aspirin: Secondary | ICD-10-CM

## 2019-09-22 DIAGNOSIS — Z79899 Other long term (current) drug therapy: Secondary | ICD-10-CM

## 2019-09-22 DIAGNOSIS — M329 Systemic lupus erythematosus, unspecified: Secondary | ICD-10-CM | POA: Diagnosis present

## 2019-09-22 DIAGNOSIS — E1122 Type 2 diabetes mellitus with diabetic chronic kidney disease: Secondary | ICD-10-CM | POA: Diagnosis present

## 2019-09-22 DIAGNOSIS — I129 Hypertensive chronic kidney disease with stage 1 through stage 4 chronic kidney disease, or unspecified chronic kidney disease: Secondary | ICD-10-CM | POA: Diagnosis present

## 2019-09-22 DIAGNOSIS — J9600 Acute respiratory failure, unspecified whether with hypoxia or hypercapnia: Secondary | ICD-10-CM | POA: Diagnosis present

## 2019-09-22 DIAGNOSIS — E1140 Type 2 diabetes mellitus with diabetic neuropathy, unspecified: Secondary | ICD-10-CM | POA: Diagnosis present

## 2019-09-22 DIAGNOSIS — U071 COVID-19: Principal | ICD-10-CM | POA: Diagnosis present

## 2019-09-22 DIAGNOSIS — Z87891 Personal history of nicotine dependence: Secondary | ICD-10-CM | POA: Diagnosis not present

## 2019-09-22 DIAGNOSIS — E1165 Type 2 diabetes mellitus with hyperglycemia: Secondary | ICD-10-CM | POA: Diagnosis present

## 2019-09-22 DIAGNOSIS — E119 Type 2 diabetes mellitus without complications: Secondary | ICD-10-CM

## 2019-09-22 DIAGNOSIS — E7800 Pure hypercholesterolemia, unspecified: Secondary | ICD-10-CM | POA: Diagnosis present

## 2019-09-22 DIAGNOSIS — I10 Essential (primary) hypertension: Secondary | ICD-10-CM | POA: Diagnosis present

## 2019-09-22 DIAGNOSIS — Z7984 Long term (current) use of oral hypoglycemic drugs: Secondary | ICD-10-CM

## 2019-09-22 DIAGNOSIS — Z885 Allergy status to narcotic agent status: Secondary | ICD-10-CM

## 2019-09-22 DIAGNOSIS — Z888 Allergy status to other drugs, medicaments and biological substances status: Secondary | ICD-10-CM | POA: Diagnosis not present

## 2019-09-22 DIAGNOSIS — E785 Hyperlipidemia, unspecified: Secondary | ICD-10-CM | POA: Diagnosis present

## 2019-09-22 LAB — COMPREHENSIVE METABOLIC PANEL
ALT: 30 U/L (ref 0–44)
AST: 49 U/L — ABNORMAL HIGH (ref 15–41)
Albumin: 3.1 g/dL — ABNORMAL LOW (ref 3.5–5.0)
Alkaline Phosphatase: 43 U/L (ref 38–126)
Anion gap: 12 (ref 5–15)
BUN: 22 mg/dL (ref 8–23)
CO2: 21 mmol/L — ABNORMAL LOW (ref 22–32)
Calcium: 9.1 mg/dL (ref 8.9–10.3)
Chloride: 104 mmol/L (ref 98–111)
Creatinine, Ser: 1.29 mg/dL — ABNORMAL HIGH (ref 0.44–1.00)
GFR calc Af Amer: 48 mL/min — ABNORMAL LOW (ref >60)
GFR calc non Af Amer: 41 mL/min — ABNORMAL LOW (ref >60)
Glucose, Bld: 236 mg/dL — ABNORMAL HIGH (ref 70–99)
Potassium: 4.8 mmol/L (ref 3.5–5.1)
Sodium: 137 mmol/L (ref 135–145)
Total Bilirubin: 0.7 mg/dL (ref 0.3–1.2)
Total Protein: 7.2 g/dL (ref 6.5–8.1)

## 2019-09-22 LAB — CBC WITH DIFFERENTIAL/PLATELET
Abs Immature Granulocytes: 0.02 10*3/uL (ref 0.00–0.07)
Basophils Absolute: 0 10*3/uL (ref 0.0–0.1)
Basophils Relative: 0 %
Eosinophils Absolute: 0 10*3/uL (ref 0.0–0.5)
Eosinophils Relative: 0 %
HCT: 37.5 % (ref 36.0–46.0)
Hemoglobin: 11.8 g/dL — ABNORMAL LOW (ref 12.0–15.0)
Immature Granulocytes: 1 %
Lymphocytes Relative: 23 %
Lymphs Abs: 1 10*3/uL (ref 0.7–4.0)
MCH: 25.4 pg — ABNORMAL LOW (ref 26.0–34.0)
MCHC: 31.5 g/dL (ref 30.0–36.0)
MCV: 80.8 fL (ref 80.0–100.0)
Monocytes Absolute: 0.2 10*3/uL (ref 0.1–1.0)
Monocytes Relative: 5 %
Neutro Abs: 3.1 10*3/uL (ref 1.7–7.7)
Neutrophils Relative %: 71 %
Platelets: 271 10*3/uL (ref 150–400)
RBC: 4.64 MIL/uL (ref 3.87–5.11)
RDW: 14.8 % (ref 11.5–15.5)
WBC: 4.3 10*3/uL (ref 4.0–10.5)
nRBC: 0 % (ref 0.0–0.2)

## 2019-09-22 LAB — C-REACTIVE PROTEIN: CRP: 13.3 mg/dL — ABNORMAL HIGH (ref <1.0)

## 2019-09-22 LAB — LIPID PANEL
Cholesterol: 167 mg/dL (ref 0–200)
HDL: 28 mg/dL — ABNORMAL LOW (ref >40)
LDL Cholesterol: 110 mg/dL — ABNORMAL HIGH (ref 0–99)
Total CHOL/HDL Ratio: 6 RATIO
Triglycerides: 147 mg/dL (ref <150)
VLDL: 29 mg/dL (ref 0–40)

## 2019-09-22 LAB — CBC
HCT: 39.9 % (ref 36.0–46.0)
Hemoglobin: 12.5 g/dL (ref 12.0–15.0)
MCH: 25.5 pg — ABNORMAL LOW (ref 26.0–34.0)
MCHC: 31.3 g/dL (ref 30.0–36.0)
MCV: 81.3 fL (ref 80.0–100.0)
Platelets: 273 10*3/uL (ref 150–400)
RBC: 4.91 MIL/uL (ref 3.87–5.11)
RDW: 14.8 % (ref 11.5–15.5)
WBC: 2.6 10*3/uL — ABNORMAL LOW (ref 4.0–10.5)
nRBC: 0 % (ref 0.0–0.2)

## 2019-09-22 LAB — GLUCOSE, CAPILLARY: Glucose-Capillary: 359 mg/dL — ABNORMAL HIGH (ref 70–99)

## 2019-09-22 LAB — HEMOGLOBIN A1C
Hgb A1c MFr Bld: 7.5 % — ABNORMAL HIGH (ref 4.8–5.6)
Mean Plasma Glucose: 168.55 mg/dL

## 2019-09-22 LAB — ABO/RH: ABO/RH(D): A POS

## 2019-09-22 LAB — TRIGLYCERIDES: Triglycerides: 202 mg/dL — ABNORMAL HIGH (ref <150)

## 2019-09-22 LAB — LACTATE DEHYDROGENASE: LDH: 505 U/L — ABNORMAL HIGH (ref 98–192)

## 2019-09-22 LAB — FERRITIN: Ferritin: 1483 ng/mL — ABNORMAL HIGH (ref 11–307)

## 2019-09-22 LAB — BRAIN NATRIURETIC PEPTIDE: B Natriuretic Peptide: 131.7 pg/mL — ABNORMAL HIGH (ref 0.0–100.0)

## 2019-09-22 LAB — TROPONIN I (HIGH SENSITIVITY): Troponin I (High Sensitivity): 14 ng/L (ref <18)

## 2019-09-22 LAB — LACTIC ACID, PLASMA
Lactic Acid, Venous: 2.2 mmol/L (ref 0.5–1.9)
Lactic Acid, Venous: 1.8 mmol/L (ref 0.5–1.9)

## 2019-09-22 LAB — HEPATITIS B SURFACE ANTIGEN: Hepatitis B Surface Ag: NONREACTIVE

## 2019-09-22 LAB — PROCALCITONIN: Procalcitonin: 0.16 ng/mL

## 2019-09-22 LAB — CREATININE, SERUM
Creatinine, Ser: 1.28 mg/dL — ABNORMAL HIGH (ref 0.44–1.00)
GFR calc Af Amer: 48 mL/min — ABNORMAL LOW (ref >60)
GFR calc non Af Amer: 41 mL/min — ABNORMAL LOW (ref >60)

## 2019-09-22 LAB — FIBRINOGEN: Fibrinogen: 708 mg/dL — ABNORMAL HIGH (ref 210–475)

## 2019-09-22 LAB — CBG MONITORING, ED: Glucose-Capillary: 270 mg/dL — ABNORMAL HIGH (ref 70–99)

## 2019-09-22 LAB — D-DIMER, QUANTITATIVE: D-Dimer, Quant: 2.29 ug/mL-FEU — ABNORMAL HIGH (ref 0.00–0.50)

## 2019-09-22 MED ORDER — ZINC SULFATE 220 (50 ZN) MG PO CAPS
220.0000 mg | ORAL_CAPSULE | Freq: Every day | ORAL | Status: DC
  Administered 2019-09-22 – 2019-09-29 (×8): 220 mg via ORAL
  Filled 2019-09-22 (×7): qty 1

## 2019-09-22 MED ORDER — PRAVASTATIN SODIUM 10 MG PO TABS
10.0000 mg | ORAL_TABLET | Freq: Every day | ORAL | Status: DC
  Administered 2019-09-22 – 2019-09-29 (×8): 10 mg via ORAL
  Filled 2019-09-22 (×8): qty 1

## 2019-09-22 MED ORDER — AMLODIPINE BESYLATE 10 MG PO TABS
10.0000 mg | ORAL_TABLET | Freq: Every day | ORAL | Status: DC
  Administered 2019-09-22 – 2019-09-29 (×8): 10 mg via ORAL
  Filled 2019-09-22 (×2): qty 1
  Filled 2019-09-22: qty 2
  Filled 2019-09-22 (×5): qty 1

## 2019-09-22 MED ORDER — SODIUM CHLORIDE 0.9 % IV SOLN
100.0000 mg | Freq: Every day | INTRAVENOUS | Status: AC
  Administered 2019-09-23 – 2019-09-26 (×4): 100 mg via INTRAVENOUS
  Filled 2019-09-22 (×4): qty 20

## 2019-09-22 MED ORDER — ENOXAPARIN SODIUM 40 MG/0.4ML ~~LOC~~ SOLN
40.0000 mg | SUBCUTANEOUS | Status: DC
  Administered 2019-09-22 – 2019-09-29 (×8): 40 mg via SUBCUTANEOUS
  Filled 2019-09-22 (×8): qty 0.4

## 2019-09-22 MED ORDER — ALBUTEROL SULFATE HFA 108 (90 BASE) MCG/ACT IN AERS
4.0000 | INHALATION_SPRAY | Freq: Once | RESPIRATORY_TRACT | Status: AC
  Administered 2019-09-22: 4 via RESPIRATORY_TRACT
  Filled 2019-09-22: qty 6.7

## 2019-09-22 MED ORDER — ASCORBIC ACID 500 MG PO TABS
500.0000 mg | ORAL_TABLET | Freq: Every day | ORAL | Status: DC
  Administered 2019-09-22 – 2019-09-29 (×8): 500 mg via ORAL
  Filled 2019-09-22 (×7): qty 1

## 2019-09-22 MED ORDER — CARVEDILOL 12.5 MG PO TABS
25.0000 mg | ORAL_TABLET | Freq: Two times a day (BID) | ORAL | Status: DC
  Administered 2019-09-22 – 2019-09-29 (×14): 25 mg via ORAL
  Filled 2019-09-22 (×15): qty 2

## 2019-09-22 MED ORDER — IPRATROPIUM BROMIDE HFA 17 MCG/ACT IN AERS
2.0000 | INHALATION_SPRAY | Freq: Once | RESPIRATORY_TRACT | Status: AC
  Administered 2019-09-22: 2 via RESPIRATORY_TRACT
  Filled 2019-09-22: qty 12.9

## 2019-09-22 MED ORDER — DEXAMETHASONE SODIUM PHOSPHATE 10 MG/ML IJ SOLN
8.0000 mg | Freq: Once | INTRAMUSCULAR | Status: AC
  Administered 2019-09-22: 8 mg via INTRAVENOUS
  Filled 2019-09-22: qty 1

## 2019-09-22 MED ORDER — METHYLPREDNISOLONE SODIUM SUCC 125 MG IJ SOLR
47.0000 mg | Freq: Two times a day (BID) | INTRAMUSCULAR | Status: DC
  Administered 2019-09-22 – 2019-09-24 (×5): 47 mg via INTRAVENOUS
  Filled 2019-09-22 (×5): qty 2

## 2019-09-22 MED ORDER — ALBUTEROL SULFATE HFA 108 (90 BASE) MCG/ACT IN AERS
2.0000 | INHALATION_SPRAY | RESPIRATORY_TRACT | Status: DC | PRN
  Administered 2019-09-24 – 2019-09-26 (×3): 2 via RESPIRATORY_TRACT
  Filled 2019-09-22: qty 6.7

## 2019-09-22 MED ORDER — ACETAMINOPHEN 325 MG PO TABS
650.0000 mg | ORAL_TABLET | Freq: Once | ORAL | Status: AC
  Administered 2019-09-22: 650 mg via ORAL
  Filled 2019-09-22: qty 2

## 2019-09-22 MED ORDER — ONDANSETRON HCL 4 MG PO TABS: 4.0000 mg | ORAL_TABLET | Freq: Four times a day (QID) | ORAL | Status: DC | PRN

## 2019-09-22 MED ORDER — SODIUM CHLORIDE 0.9 % IV SOLN
INTRAVENOUS | Status: DC
  Administered 2019-09-22: 16:00:00 via INTRAVENOUS

## 2019-09-22 MED ORDER — INSULIN ASPART 100 UNIT/ML ~~LOC~~ SOLN
0.0000 [IU] | Freq: Every day | SUBCUTANEOUS | Status: DC
  Administered 2019-09-22 – 2019-09-25 (×4): 5 [IU] via SUBCUTANEOUS
  Administered 2019-09-26: 4 [IU] via SUBCUTANEOUS

## 2019-09-22 MED ORDER — ASPIRIN EC 81 MG PO TBEC
81.0000 mg | DELAYED_RELEASE_TABLET | Freq: Every day | ORAL | Status: DC
  Administered 2019-09-22 – 2019-09-29 (×8): 81 mg via ORAL
  Filled 2019-09-22 (×8): qty 1

## 2019-09-22 MED ORDER — SODIUM CHLORIDE 0.9 % IV BOLUS
1000.0000 mL | Freq: Once | INTRAVENOUS | Status: AC
  Administered 2019-09-22: 1000 mL via INTRAVENOUS

## 2019-09-22 MED ORDER — PREGABALIN 25 MG PO CAPS
75.0000 mg | ORAL_CAPSULE | Freq: Three times a day (TID) | ORAL | Status: DC
  Administered 2019-09-22 – 2019-09-29 (×22): 75 mg via ORAL
  Filled 2019-09-22 (×2): qty 3
  Filled 2019-09-22 (×2): qty 1
  Filled 2019-09-22 (×2): qty 3
  Filled 2019-09-22 (×5): qty 1
  Filled 2019-09-22: qty 3
  Filled 2019-09-22 (×7): qty 1
  Filled 2019-09-22 (×3): qty 3

## 2019-09-22 MED ORDER — SODIUM CHLORIDE 0.9 % IV SOLN
200.0000 mg | Freq: Once | INTRAVENOUS | Status: AC
  Administered 2019-09-22: 200 mg via INTRAVENOUS
  Filled 2019-09-22: qty 40

## 2019-09-22 MED ORDER — ACETAMINOPHEN 325 MG PO TABS
650.0000 mg | ORAL_TABLET | Freq: Four times a day (QID) | ORAL | Status: DC | PRN
  Filled 2019-09-22: qty 2

## 2019-09-22 MED ORDER — ONDANSETRON HCL 4 MG/2ML IJ SOLN: 4.0000 mg | Freq: Four times a day (QID) | INTRAMUSCULAR | Status: DC | PRN

## 2019-09-22 MED ORDER — MIRTAZAPINE 15 MG PO TABS
7.5000 mg | ORAL_TABLET | Freq: Every day | ORAL | Status: DC
  Administered 2019-09-22 – 2019-09-28 (×7): 7.5 mg via ORAL
  Filled 2019-09-22 (×8): qty 1

## 2019-09-22 MED ORDER — ESCITALOPRAM OXALATE 10 MG PO TABS
30.0000 mg | ORAL_TABLET | Freq: Every day | ORAL | Status: DC
  Administered 2019-09-22 – 2019-09-29 (×8): 30 mg via ORAL
  Filled 2019-09-22 (×8): qty 3

## 2019-09-22 MED ORDER — INSULIN ASPART 100 UNIT/ML ~~LOC~~ SOLN
0.0000 [IU] | Freq: Three times a day (TID) | SUBCUTANEOUS | Status: DC
  Administered 2019-09-22: 8 [IU] via SUBCUTANEOUS
  Administered 2019-09-23: 15 [IU] via SUBCUTANEOUS
  Administered 2019-09-23: 11 [IU] via SUBCUTANEOUS

## 2019-09-22 NOTE — ED Notes (Signed)
ED TO INPATIENT HANDOFF REPORT  ED Nurse Name and Phone #: [phone] Lauree Chandler., RN  S Name/Age/Gender Deborah Weeks 73 y.o. female Room/Bed: 024C/024C  Code Status   Code Status: Full Code  Home/SNF/Other Home Patient oriented to: self, place, time and situation Is this baseline? Yes   Triage Complete: Triage complete  Chief Complaint Acute hypoxemic respiratory failure due to COVID-19 ([place]) [U07.1, J96.01]  Triage Note Pt was tx on wednesday for pneumonia, currently on antibiotics. Pt started having shortness of breath again today. Pt O2 sats @ 75% on 7 L [place]. Non rebreather mask placed on pt. Pt currently at 100%. Pt wears 7L [place] at home     Allergies Allergies  Allergen Reactions  . Atorvastatin Other (See Comments)    Cramps   . Escitalopram Oxalate Other (See Comments)    Insomnia, but patient currently takes this  . Codeine Nausea And Vomiting  . Niacin And Related Itching and Rash    Level of Care/Admitting Diagnosis ED Disposition    ED Disposition Condition [facility] Area: [place] [[id number]]  Level of Care: Progressive [102]  Covid Evaluation: Confirmed COVID Positive  Diagnosis: Acute hypoxemic respiratory failure due to COVID-19 [facility]) [id number]  Admitting Physician: Mckinley Jewel [id number]  Attending Physician: Mckinley Jewel [phone]  Estimated length of stay: 3 - 4 days  Certification:: I certify this patient will need inpatient services for at least 2 midnights       B Medical/Surgery History Past Medical History:  Diagnosis Date  . Arthritis   . Diabetes mellitus without complication ([place])   . High cholesterol   . Hypertension   . Lupus ([place])   . Neuropathy    No past surgical history on file.   A IV Location/Drains/Wounds Patient Lines/Drains/Airways Status   Active Line/Drains/Airways    Name:   Placement date:   Placement time:   Site:   Days:   Peripheral IV 09/22/19 Left Hand    09/22/19    1106    Hand   less than 1          Intake/Output Last 24 hours No intake or output data in the 24 hours ending 09/22/19 1953  Labs/Imaging Results for orders placed or performed during the hospital encounter of 09/22/19 (from the past 48 hour(s))  Lactic acid, plasma     Status: Abnormal   Collection Time: 09/22/19 11:14 AM  Result Value Ref Range   Lactic Acid, Venous 2.2 (HH) 0.5 - 1.9 mmol/L    Comment: CRITICAL RESULT CALLED TO, READ BACK BY AND VERIFIED WITH: J.BLUE RN @ [id number] 09/22/2019 BY C.EDENS Performed at [facility], [place] [address] [place], [place] [address]   CBC WITH DIFFERENTIAL     Status: Abnormal   Collection Time: 09/22/19 11:14 AM  Result Value Ref Range   WBC 4.3 4.0 - 10.5 K/uL   RBC 4.64 3.87 - 5.11 MIL/uL   Hemoglobin 11.8 (L) 12.0 - 15.0 g/dL   HCT 37.5 36.0 - 46.0 %   MCV 80.8 80.0 - 100.0 fL   MCH 25.4 (L) 26.0 - 34.0 pg   MCHC 31.5 30.0 - 36.0 g/dL   RDW 14.8 11.5 - 15.5 %   Platelets 271 150 - 400 K/uL   nRBC 0.0 0.0 - 0.2 %   Neutrophils Relative % 71 %   Neutro Abs 3.1 1.7 - 7.7 K/uL   Lymphocytes Relative 23 %  Lymphs Abs 1.0 0.7 - 4.0 K/uL   Monocytes Relative 5 %   Monocytes Absolute 0.2 0.1 - 1.0 K/uL   Eosinophils Relative 0 %   Eosinophils Absolute 0.0 0.0 - 0.5 K/uL   Basophils Relative 0 %   Basophils Absolute 0.0 0.0 - 0.1 K/uL   WBC Morphology MORPHOLOGY UNREMARKABLE    Immature Granulocytes 1 %   Abs Immature Granulocytes 0.02 0.00 - 0.07 K/uL    Comment: Performed at [facility], [place] [address] [place], [place] [address]  Comprehensive metabolic panel     Status: Abnormal   Collection Time: 09/22/19 11:14 AM  Result Value Ref Range   Sodium 137 135 - 145 mmol/L   Potassium 4.8 3.5 - 5.1 mmol/L   Chloride 104 98 - 111 mmol/L   CO2 21 (L) 22 - 32 mmol/L   Glucose, Bld 236 (H) 70 - 99 mg/dL   BUN 22 8 - 23 mg/dL   Creatinine, Ser 1.29 (H) 0.44 - 1.00 mg/dL   Calcium 9.1 8.9 - 10.3 mg/dL    Total Protein 7.2 6.5 - 8.1 g/dL   Albumin 3.1 (L) 3.5 - 5.0 g/dL   AST 49 (H) 15 - 41 U/L   ALT 30 0 - 44 U/L   Alkaline Phosphatase 43 38 - 126 U/L   Total Bilirubin 0.7 0.3 - 1.2 mg/dL   GFR calc non Af Amer 41 (L) >60 mL/min   GFR calc Af Amer 48 (L) >60 mL/min   Anion gap 12 5 - 15    Comment: Performed at [facility] [address], [place], [place] [address]  D-dimer, quantitative     Status: Abnormal   Collection Time: 09/22/19 11:14 AM  Result Value Ref Range   D-Dimer, Quant 2.29 (H) 0.00 - 0.50 ug/mL-FEU    Comment: (NOTE) At the manufacturer cut-off of 0.50 ug/mL FEU, this assay has been documented to exclude PE with a sensitivity and negative predictive value of 97 to 99%.  At this time, this assay has not been approved by the FDA to exclude DVT/VTE. Results should be correlated with clinical presentation. Performed at [facility], [place] [address] [place], [place] [address]   Procalcitonin     Status: None   Collection Time: 09/22/19 11:14 AM  Result Value Ref Range   Procalcitonin 0.16 ng/mL    Comment:        Interpretation: PCT (Procalcitonin) <= 0.5 ng/mL: Systemic infection (sepsis) is not likely. Local bacterial infection is possible. (NOTE)       Sepsis PCT Algorithm           Lower Respiratory Tract                                      Infection PCT Algorithm    ----------------------------     ----------------------------         PCT < 0.25 ng/mL                PCT < 0.10 ng/mL         Strongly encourage             Strongly discourage   discontinuation of antibiotics    initiation of antibiotics    ----------------------------     -----------------------------       PCT 0.25 - 0.50 ng/mL  PCT 0.10 - 0.25 ng/mL               OR       >80% decrease in PCT            Discourage initiation of                                            antibiotics      Encourage discontinuation           of antibiotics     ----------------------------     -----------------------------         PCT >= 0.50 ng/mL              PCT 0.26 - 0.50 ng/mL               AND        <80% decrease in PCT             Encourage initiation of                                             antibiotics       Encourage continuation           of antibiotics    ----------------------------     -----------------------------        PCT >= 0.50 ng/mL                  PCT > 0.50 ng/mL               AND         increase in PCT                  Strongly encourage                                      initiation of antibiotics    Strongly encourage escalation           of antibiotics                                     -----------------------------                                           PCT <= 0.25 ng/mL                                                 OR                                        > 80% decrease in PCT  Discontinue / Do not initiate                                             antibiotics Performed at [facility], [place] [address] [place], [place] [address]   Lactate dehydrogenase     Status: Abnormal   Collection Time: 09/22/19 11:14 AM  Result Value Ref Range   LDH 505 (H) 98 - 192 U/L    Comment: Performed at [facility], [place] [address], [place], [place] [address]  Ferritin     Status: Abnormal   Collection Time: 09/22/19 11:14 AM  Result Value Ref Range   Ferritin 1,483 (H) 11 - 307 ng/mL    Comment: Performed at [facility], [place] [address], [place], [place] [address]  Fibrinogen     Status: Abnormal   Collection Time: 09/22/19 11:14 AM  Result Value Ref Range   Fibrinogen 708 (H) 210 - 475 mg/dL    Comment: Performed at [facility] [address], [place], [place] [address]  C-reactive protein     Status: Abnormal   Collection Time: 09/22/19 11:14 AM  Result Value Ref Range   CRP 13.3 (H) <1.0 mg/dL    Comment: Performed at [facility] [address], [place], [place] [address]  Triglycerides     Status: Abnormal   Collection Time: 09/22/19 11:14 AM  Result Value Ref Range   Triglycerides 202 (H) <150 mg/dL    Comment: Performed at [facility] [address], [place], [place] [address]  CBG monitoring, ED     Status: Abnormal   Collection Time: 09/22/19  4:19 PM  Result Value Ref Range   Glucose-Capillary 270 (H) 70 - 99 mg/dL   DG Chest Port 1 View  Result Date: 09/22/2019 CLINICAL DATA:  Pt was tx on wednesday for pneumonia, currently on antibiotics. Pt started having shortness of breath again today. Pt O2 sats @ 75% on 7 L [place]. Non rebreather mask placed on pt. Pt currently at 100%. Pt wears 7L [place] at home Per triage notes 12/12 COVID + EXAM: PORTABLE CHEST 1 VIEW COMPARISON:  09/19/2019. FINDINGS: Bilateral airspace opacities have increased when compared to the prior exam, most evident on the left in the mid and lower lung, to a lesser degree in the right medial and inferior upper lobe and right lung base. No convincing pleural effusion.  No pneumothorax. Cardiac silhouette is top-normal in size. IMPRESSION: 1. Interval worsening of lung aeration an increase the bilateral airspace opacities, which are consistent with multifocal COVID-19 pneumonia. Electronically Signed   By: Lajean Manes M.D.   On: 09/22/2019 11:39    Pending Labs Unresulted Labs (From admission, onward)    Start     Ordered   09/29/19 0500  Creatinine, serum  (enoxaparin (LOVENOX)    CrCl >/= 30 ml/min)  Weekly,   R    Comments: while on enoxaparin therapy    09/22/19 1301   09/23/19 0500  CBC with Differential/Platelet  Daily,   R     09/22/19 1301   09/23/19 0500  Comprehensive metabolic panel  Daily,   R     09/22/19 1301   09/23/19 0500  C-reactive protein  Daily,   R     09/22/19 1301   09/23/19 0500  D-dimer, quantitative (not  at [facility])  Daily,   R     09/22/19 1301   09/23/19 0500  Ferritin  Daily,   R     09/22/19 1301    09/23/19 0500  Magnesium  Daily,   R     09/22/19 1301   09/22/19 1321  Hemoglobin A1c  Once,   STAT    Comments: To assess prior glycemic control    09/22/19 1320   09/22/19 1321  Lipid panel  Add-on,   AD     09/22/19 1320   09/22/19 1300  Hepatitis B surface antigen  Once,   STAT     09/22/19 1301   09/22/19 1300  Brain natriuretic peptide  Once,   STAT     09/22/19 1301   09/22/19 1300  Culture, sputum-assessment  Once,   R     09/22/19 1301   09/22/19 1300  Culture, blood (Routine X 2) w Reflex to ID Panel  BLOOD CULTURE X 2,   R (with STAT occurrences)     09/22/19 1301   09/22/19 1258  ABO/Rh  Once,   STAT     09/22/19 1301   09/22/19 1258  CBC  (enoxaparin (LOVENOX)    CrCl >/= 30 ml/min)  Once,   STAT    Comments: Baseline for enoxaparin therapy IF NOT ALREADY DRAWN.  Notify MD if PLT < 100 K.    09/22/19 1301   09/22/19 1258  Creatinine, serum  (enoxaparin (LOVENOX)    CrCl >/= 30 ml/min)  Once,   STAT    Comments: Baseline for enoxaparin therapy IF NOT ALREADY DRAWN.    09/22/19 1301   09/22/19 1236  Lactic acid, plasma  ONCE - STAT,   STAT     09/22/19 1235          Vitals/Pain Today's Vitals   09/22/19 1700 09/22/19 1715 09/22/19 1745 09/22/19 1800  BP: 136/66 130/66 (!) 115/101 129/64  Pulse: 76 74 78 72  Resp: (!) 29 15 (!) 21   Temp:      TempSrc:      SpO2: 91% 92% 92% 92%  Weight:      Height:      PainSc:        Isolation Precautions Airborne and Contact precautions  Medications Medications  enoxaparin (LOVENOX) injection 40 mg (40 mg Subcutaneous Given 09/22/19 1555)  0.9 %  sodium chloride infusion ( Intravenous New Bag/Given 09/22/19 1611)  remdesivir 200 mg in sodium chloride 0.9% 250 mL IVPB (0 mg Intravenous Stopped 09/22/19 1751)    Followed by  remdesivir 100 mg in sodium chloride 0.9 % 100 mL IVPB (has no administration in time range)  methylPREDNISolone sodium succinate (SOLU-MEDROL) 125 mg/2 mL injection 47 mg (47 mg Intravenous  Given 09/22/19 1551)  vitamin C (ASCORBIC ACID) tablet 500 mg (500 mg Oral Given 09/22/19 1558)  zinc sulfate capsule 220 mg (220 mg Oral Given 09/22/19 1557)  acetaminophen (TYLENOL) tablet 650 mg (has no administration in time range)  ondansetron (ZOFRAN) tablet 4 mg (has no administration in time range)    Or  ondansetron (ZOFRAN) injection 4 mg (has no administration in time range)  albuterol (VENTOLIN HFA) 108 (90 Base) MCG/ACT inhaler 2 puff (has no administration in time range)  insulin aspart (novoLOG) injection 0-15 Units (8 Units Subcutaneous Given 09/22/19 1628)  insulin aspart (novoLOG) injection 0-5 Units (has no administration in time range)  aspirin EC tablet 81 mg (81 mg Oral Given 09/22/19 1557)  amLODipine (NORVASC) tablet 10 mg (10 mg Oral Given 09/22/19 1558)  carvedilol (COREG) tablet 25 mg (25 mg Oral Not Given 09/22/19 1559)  pravastatin (PRAVACHOL) tablet 10 mg (10 mg Oral Given 09/22/19 1558)  escitalopram (LEXAPRO) tablet 30 mg (30 mg Oral Given 09/22/19 1559)  mirtazapine (REMERON) tablet 7.5 mg (has no administration in time range)  pregabalin (LYRICA) capsule 75 mg (75 mg Oral Given 09/22/19 1559)  albuterol (VENTOLIN HFA) 108 (90 Base) MCG/ACT inhaler 4 puff (4 puffs Inhalation Given 09/22/19 1221)  ipratropium (ATROVENT HFA) inhaler 2 puff (2 puffs Inhalation Given 09/22/19 1223)  dexamethasone (DECADRON) injection 8 mg (8 mg Intravenous Given 09/22/19 1221)  acetaminophen (TYLENOL) tablet 650 mg (650 mg Oral Given 09/22/19 1218)  sodium chloride 0.9 % bolus 1,000 mL (0 mLs Intravenous Stopped 09/22/19 1551)    Mobility walks with device Low fall risk   Focused Assessments Pulmonary Assessment Handoff:  Lung sounds: Bilateral Breath Sounds: Diminished, Clear O2 Device: Nasal Cannula O2 Flow Rate (L/min): 7 L/min      R Recommendations: See Admitting Provider Note  Report given to:   Additional Notes:

## 2019-09-22 NOTE — ED Notes (Signed)
PTAR at facility for transport. Remo Lipps, RN accompanying pt and PTAR to [facility]. This RN called [facility] to notify that pt is in transit.

## 2019-09-22 NOTE — ED Notes (Signed)
Date and time results received: 09/22/19 1219 (use smartphrase ".now" to insert current time)  Test: Lactic Critical Value: 2.2  Name of Provider Notified: Steinl  Orders Received? Or Actions Taken?:

## 2019-09-22 NOTE — ED Notes (Signed)
Pt called daughter to notify of plan of care

## 2019-09-22 NOTE — H&P (Signed)
History and Physical    [name]:[id number] DOB: 06-Sep-1946 DOA: 09/22/2019  PCP: Buzzy Han, MD  Patient coming from: home I have personally briefly reviewed patient's old medical records in [place]  Chief Complaint: Worsening cough and shortness of breath since 3 days  HPI: Deborah Weeks is a 73 y.o. female with medical history significant of hypertension, diabetes, hyperlipidemia, lupus presents to emergency department due to worsening cough and shortness of breath since 3 days.  Patient presented to emergency department 2 days ago with similar symptoms.  Her initial work-up was negative, chest x-ray shows possible infiltrate and patient discharged home on p.o. Levaquin.  Covid test at that time was pending.  Today patient tells me that she has fever, chills, generalized weakness, lethargic, decreased appetite, loose stools, productive cough and worsening shortness of breath.  She tells me that she has noticed blood in phlegm.  She uses oxygen as needed at home.  Reports that due to her worsening shortness of breath she required increased amount of oxygen.    She lives with her daughter and denies smoking, alcohol, illicit drug use.   ED Course: Upon arrival: Patient had a fever of 101.4, tachypneic, hypoxic with oxygen saturation of 71%.  Patient placed on 7 L of oxygen via nasal cannula, COVID-19 +2 days back.  CBC shows no leukocytosis, lactic acid elevated at 2.2.  Inflammatory markers elevated and chest x-ray shows worsening multifocal pneumonia.  Patient received IV Decadron, Atrovent and Ventolin and Tylenol in ED.  Review of Systems: As per HPI otherwise negative.    Past Medical History:  Diagnosis Date  . Arthritis   . Diabetes mellitus without complication ([place])   . High cholesterol   . Hypertension   . Lupus ([place])   . Neuropathy     No past surgical history on file.   reports that she has quit smoking. She has never  used smokeless tobacco. She reports that she does not drink alcohol or use drugs.  Allergies  Allergen Reactions  . Atorvastatin Other (See Comments)    cramps  . Escitalopram Oxalate Other (See Comments)    Insomnia  . Codeine Nausea And Vomiting  . Niacin And Related Itching and Rash    Family History  Problem Relation Age of Onset  . Breast cancer Cousin     Prior to Admission medications   Medication Sig Start Date End Date Taking? Authorizing Provider  acetaminophen (TYLENOL) 650 MG CR tablet Take 1,300 mg by mouth every 8 (eight) hours as needed for pain.    [provider]  albuterol (PROVENTIL HFA;VENTOLIN HFA) 108 (90 Base) MCG/ACT inhaler Inhale 2 puffs every 4 (four) hours as needed into the lungs for wheezing or shortness of breath. 08/15/17   Janne Napoleon, NP  amLODipine (NORVASC) 10 MG tablet Take 1 tablet (10 mg total) by mouth daily. 10/21/18   Domenic Polite, MD  aspirin EC 81 MG tablet Take 81 mg by mouth daily.    [provider]  Blood Glucose Monitoring Suppl (ONETOUCH VERIO FLEX SYSTEM) w/Device KIT USE TO CHECK BLOOD SUGAR TWICE A DAY 05/23/19   [provider]  carvedilol (COREG) 25 MG tablet Take 25 mg by mouth 2 (two) times daily.  05/26/15   [provider]  diclofenac (VOLTAREN) 75 MG EC tablet TAKE 1 TABLET BY MOUTH TWICE A DAY Patient taking differently: Take 75 mg by mouth 2 (two) times daily.  01/22/19   Landis Martins, DPM  escitalopram (LEXAPRO) 20 MG tablet Take 20 mg by mouth daily.    [provider]  furosemide (LASIX) 20 MG tablet Take 1 tablet (20 mg total) by mouth 2 (two) times daily. 11/07/18   Jerline Pain, MD  Lancets (ONETOUCH DELICA PLUS [id number]) MISC USE TO CHECK BLOOD SUGAR THREE TIMES A DAY 05/25/19   [provider]  levofloxacin (LEVAQUIN) 750 MG tablet Take 1 tablet (750 mg total) by mouth daily. X 7 days 09/20/19   Veryl Speak, MD  LYRICA 75 MG capsule Take 75 mg by mouth 3  (three) times daily. 02/24/18   [provider]  metFORMIN (GLUCOPHAGE) 500 MG tablet Take 1,000 mg by mouth 2 (two) times daily with a meal.    [provider]  mirtazapine (REMERON) 15 MG tablet Take 7.5 mg by mouth at bedtime.    [provider]  ONETOUCH VERIO test strip USE TO CHECK BLOOD SUGAR THREE TIMES A DAY 05/24/19   [provider]  pravastatin (PRAVACHOL) 10 MG tablet Take 10 mg by mouth daily. Takes instead of [place]    [provider]    Physical Exam: Vitals:   09/22/19 1128 09/22/19 1131 09/22/19 1200 09/22/19 1221  BP:    (!) 146/67  Pulse: 77  77 79  Resp: (!) 22  (!) 26 (!) 25  Temp:      TempSrc:      SpO2: 93%  93% (!) 88%  Weight:  94 kg    Height:  5' 7"  (1.702 m)      Constitutional: NAD, calm, comfortable, on 7 L of oxygen via nasal cannula.  Communicating well. Eyes: PERRL, lids and conjunctivae normal ENMT: Mucous membranes are moist. Posterior pharynx clear of any exudate or lesions.Normal dentition.  Neck: normal, supple, no masses, no thyromegaly Respiratory: clear to auscultation bilaterally, no wheezing, no crackles. Normal respiratory effort. No accessory muscle use.  Cardiovascular: Regular rate and rhythm, no murmurs / rubs / gallops. No extremity edema. 2+ pedal pulses. No carotid bruits.  Abdomen: no tenderness, no masses palpated. No hepatosplenomegaly. Bowel sounds positive.  Musculoskeletal: no clubbing / cyanosis. No joint deformity upper and lower extremities. Good ROM, no contractures. Normal muscle tone.  Skin: no rashes, lesions, ulcers. No induration Neurologic: CN 2-12 grossly intact. Sensation intact, DTR normal. Strength 5/5 in all 4.  Psychiatric: Normal judgment and insight. Alert and oriented x 3. Normal mood.    Labs on Admission: I have personally reviewed following labs and imaging studies  CBC: Recent Labs  Lab 09/19/19 1727 09/22/19 1114  WBC 4.1 4.3  NEUTROABS  --  3.1  HGB  12.1 11.8*  HCT 38.5 37.5  MCV 80.9 80.8  PLT 188 779   Basic Metabolic Panel: Recent Labs  Lab 09/19/19 1727 09/22/19 1114  NA 140 137  K 4.2 4.8  CL 104 104  CO2 24 21*  GLUCOSE 196* 236*  BUN 22 22  CREATININE 1.40* 1.29*  CALCIUM 8.8* 9.1   GFR: Estimated Creatinine Clearance: 45.7 mL/min (A) (by C-G formula based on SCr of 1.29 mg/dL (H)). Liver Function Tests: Recent Labs  Lab 09/22/19 1114  AST 49*  ALT 30  ALKPHOS 43  BILITOT 0.7  PROT 7.2  ALBUMIN 3.1*   No results for input(s): LIPASE, AMYLASE in the last 168 hours. No results for input(s): AMMONIA in the last 168 hours. Coagulation Profile: No results for input(s): INR, PROTIME in the last 168 hours. Cardiac Enzymes: No results  for input(s): CKTOTAL, CKMB, CKMBINDEX, TROPONINI in the last 168 hours. BNP (last 3 results) No results for input(s): PROBNP in the last 8760 hours. HbA1C: No results for input(s): HGBA1C in the last 72 hours. CBG: No results for input(s): GLUCAP in the last 168 hours. Lipid Profile: Recent Labs    09/22/19 1114  TRIG 202*   Thyroid Function Tests: No results for input(s): TSH, T4TOTAL, FREET4, T3FREE, THYROIDAB in the last 72 hours. Anemia Panel: No results for input(s): VITAMINB12, FOLATE, FERRITIN, TIBC, IRON, RETICCTPCT in the last 72 hours. Urine analysis:    Component Value Date/Time   COLORURINE YELLOW 10/14/2018 1952   APPEARANCEUR HAZY (A) 10/14/2018 1952   LABSPEC 1.014 10/14/2018 1952   PHURINE 5.0 10/14/2018 1952   GLUCOSEU NEGATIVE 10/14/2018 1952   HGBUR MODERATE (A) 10/14/2018 [place] NEGATIVE 10/14/2018 [place] NEGATIVE 10/14/2018 1952   PROTEINUR 30 (A) 10/14/2018 1952   UROBILINOGEN 0.2 05/01/2010 1724   NITRITE NEGATIVE 10/14/2018 1952   LEUKOCYTESUR LARGE (A) 10/14/2018 1952    Radiological Exams on Admission: DG Chest Port 1 View  Result Date: 09/22/2019 CLINICAL DATA:  Pt was tx on wednesday for pneumonia, currently  on antibiotics. Pt started having shortness of breath again today. Pt O2 sats @ 75% on 7 L [place]. Non rebreather mask placed on pt. Pt currently at 100%. Pt wears 7L  at home Per triage notes 12/12 COVID + EXAM: PORTABLE CHEST 1 VIEW COMPARISON:  09/19/2019. FINDINGS: Bilateral airspace opacities have increased when compared to the prior exam, most evident on the left in the mid and lower lung, to a lesser degree in the right medial and inferior upper lobe and right lung base. No convincing pleural effusion.  No pneumothorax. Cardiac silhouette is top-normal in size. IMPRESSION: 1. Interval worsening of lung aeration an increase the bilateral airspace opacities, which are consistent with multifocal COVID-19 pneumonia. Electronically Signed   By: Lajean Manes M.D.   On: 09/22/2019 11:39    EKG:   Assessment/Plan Principal Problem:   Acute respiratory failure due to COVID-19 [facility]) Active Problems:   DM (diabetes mellitus) ([place])   Hypertension   Systemic lupus erythematosus ([place])   HLD (hyperlipidemia)    Acute hypoxemic respiratory failure due to Covid pneumonia: -Patient presented with high-grade fever of 101.4, tachypnea, hypoxic, COVID-19 +2 days ago.  Lactic acid elevated at 2.2.  Chest x-ray shows worsening of multifocal pneumonia.  CBC shows no leukocytosis. -Currently patient is requiring 7 L of oxygen via nasal cannula.  On continuous pulse ox. -We will admit patient at stepdown unit at [facility] for close monitoring. -Inflammatory markers elevated.  Repeat inflammatory markers tomorrow AM.  Check hepatitis panel, BNP, procalcitonin level, blood culture and sputum culture. -Start patient on IV Solu-Medrol and consulted pharmacy for remdesivir. -Albuterol as needed for shortness of breath and wheezing.  Avoid NSAIDs. -Tylenol as needed for fever more than 100.4, Zofran as needed for nausea and vomiting.   Start on p.o. vitamin C and zinc. -Patient was told that if COVID-19 pneumonitis gets  worse we might potentially use Actemra off label, she denies any known history of tuberculosis or hepatitis, understands the risk and benefits and wants to proceed with Actemra treatment if required.  Hypertension: Blood pressure is stable -Continue amlodipine and Coreg -Monitor blood pressure closely.  Hyperlipidemia: Check lipid panel continue statin  Diabetes mellitus: Check A1c  -hold Metformin -Started patient on sliding scale insulin and monitor blood sugar closely.  Depression: Stable -Continue Remeron and Lexapro  Neuropathy: Continue Lyrica  CKD stage IIIb: Stable -Continue to monitor, repeat BMP tomorrow a.m.  DVT prophylaxis: TED/SCD/Lovenox  code Status: Full code-confirmed with the patient Family Communication:None present at bedside.  Plan of care discussed with patient in length and she verbalized understanding and agreed with it. Disposition Plan: TBD  Consults called: None Admission status: Inpatient at [facility] at stepdown unit   Mckinley Jewel MD [facility] Pager [phone]  If 7PM-7AM, please contact night-coverage [email or web address] Password [email or web address]  09/22/2019, 1:03 PM

## 2019-09-22 NOTE — ED Triage Notes (Signed)
Pt was tx on wednesday for pneumonia, currently on antibiotics. Pt started having shortness of breath again today. Pt O2 sats @ 75% on 7 L [place]. Non rebreather mask placed on pt. Pt currently at 100%. Pt wears 7L [place] at home

## 2019-09-22 NOTE — ED Notes (Signed)
[facility] called [facility] to transport pt, no eta

## 2019-09-22 NOTE — ED Provider Notes (Signed)
Deborah Weeks   CSN: [id number] Arrival date & time: 09/22/19  1016     History Chief Complaint  Patient presents with  . Shortness of Breath    Deborah Weeks is a 73 y.o. female.  Patient with hx lupus, htn, c/o increased sob. Patient was in ED a few days ago and diagnosed with pna. States was placed on home 02 just this past week 'for pneumonia'. Patient states even with using 4-7 liters home o2, felt increased sob, generally weak, persistent cough and fevers. Symptoms acute onset, severe, persistent, worsening. Denies sore throat. No trouble swallowing. No headache or neck pain/stiffness. +body aches. No abd pain or nvd. No dysuria.   The history is provided by the patient and the EMS personnel.  Shortness of Breath Associated symptoms: cough and fever   Associated symptoms: no abdominal pain, no chest pain, no headaches, no neck pain, no rash, no sore throat and no vomiting        Past Medical History:  Diagnosis Date  . Arthritis   . Diabetes mellitus without complication ([place])   . High cholesterol   . Hypertension   . Lupus ([place])   . Neuropathy     Patient Active Problem List   Diagnosis Date Noted  . Abnormal urinalysis 10/15/2018  . Hypokalemia 10/15/2018  . Chest pain 10/15/2018  . Acute respiratory failure with hypoxia ([place]) 10/14/2018  . Recurrent major depressive disorder, in full remission ([place]) 10/28/2014  . Multinodular goiter 08/30/2012  . Allergic rhinitis 05/30/2012  . Type 2 diabetes mellitus ([place]) 01/05/2011  . Controlled type 2 diabetes mellitus without complication ([place]) 53/00/5110  . BP (high blood pressure) 01/04/2011  . Osteopenia 12/07/2010  . Glaucoma 02/27/2010  . 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 Systemic lupus erythematosus ([place]) 02/27/2010    No past surgical history on file.   OB History   No obstetric history on file.     Family History  Problem  Relation Age of Onset  . Breast cancer Cousin     Social History   Tobacco Use  . Smoking status: Former Research scientist (life sciences)  . Smokeless tobacco: Never Used  Substance Use Topics  . Alcohol use: Never    Alcohol/week: 0.0 standard drinks  . Drug use: Never    Home Medications Prior to Admission medications   Medication Sig Start Date End Date Taking? Authorizing Provider  acetaminophen (TYLENOL) 650 MG CR tablet Take 1,300 mg by mouth every 8 (eight) hours as needed for pain.    [provider]  albuterol (PROVENTIL HFA;VENTOLIN HFA) 108 (90 Base) MCG/ACT inhaler Inhale 2 puffs every 4 (four) hours as needed into the lungs for wheezing or shortness of breath. 08/15/17   Janne Napoleon, NP  amLODipine (NORVASC) 10 MG tablet Take 1 tablet (10 mg total) by mouth daily. 10/21/18   Domenic Polite, MD  aspirin EC 81 MG tablet Take 81 mg by mouth daily.    [provider]  Blood Glucose Monitoring Suppl (ONETOUCH VERIO FLEX SYSTEM) w/Device KIT USE TO CHECK BLOOD SUGAR TWICE A DAY 05/23/19   [provider]  carvedilol (COREG) 25 MG tablet Take 25 mg by mouth 2 (two) times daily.  05/26/15   [provider]  diclofenac (VOLTAREN) 75 MG EC tablet TAKE 1 TABLET BY MOUTH TWICE A DAY Patient taking differently: Take 75 mg by mouth 2 (two) times daily.  01/22/19   Landis Martins, DPM  escitalopram (LEXAPRO)  20 MG tablet Take 20 mg by mouth daily.    [provider]  furosemide (LASIX) 20 MG tablet Take 1 tablet (20 mg total) by mouth 2 (two) times daily. 11/07/18   Jerline Pain, MD  Lancets (ONETOUCH DELICA PLUS [id number]) MISC USE TO CHECK BLOOD SUGAR THREE TIMES A DAY 05/25/19   [provider]  levofloxacin (LEVAQUIN) 750 MG tablet Take 1 tablet (750 mg total) by mouth daily. X 7 days 09/20/19   Veryl Speak, MD  LYRICA 75 MG capsule Take 75 mg by mouth 3 (three) times daily. 02/24/18   [provider]  metFORMIN (GLUCOPHAGE) 500 MG tablet Take 1,000  mg by mouth 2 (two) times daily with a meal.    [provider]  mirtazapine (REMERON) 15 MG tablet Take 7.5 mg by mouth at bedtime.    [provider]  ONETOUCH VERIO test strip USE TO CHECK BLOOD SUGAR THREE TIMES A DAY 05/24/19   [provider]  pravastatin (PRAVACHOL) 10 MG tablet Take 10 mg by mouth daily. Takes instead of Scientist, clinical (histocompatibility and immunogenetics), Historical, MD    Allergies    Atorvastatin, Escitalopram oxalate, Codeine, and Niacin and related  Review of Systems   Review of Systems  Constitutional: Positive for fever.  HENT: Negative for sore throat.   Eyes: Negative for redness.  Respiratory: Positive for cough and shortness of breath.   Cardiovascular: Negative for chest pain.  Gastrointestinal: Negative for abdominal pain, diarrhea and vomiting.  Endocrine: Negative for polyuria.  Genitourinary: Negative for dysuria and flank pain.  Musculoskeletal: Positive for myalgias. Negative for back pain, neck pain and neck stiffness.  Skin: Negative for rash.  Neurological: Negative for headaches.  Hematological: Does not bruise/bleed easily.  Psychiatric/Behavioral: Negative for confusion.    Physical Exam Updated Vital Signs BP 130/66   Pulse 78   Temp (!) 101 F (38.3 C) (Oral)   Resp (!) 22   LMP  (LMP Unknown)   SpO2 93%   Physical Exam Vitals and nursing Weeks reviewed.  Constitutional:      Appearance: Normal appearance. She is well-developed.  HENT:     Head: Atraumatic.     Nose: Nose normal.     Mouth/Throat:     Mouth: Mucous membranes are moist.  Eyes:     General: No scleral icterus.    Conjunctiva/sclera: Conjunctivae normal.  Neck:     Trachea: No tracheal deviation.  Cardiovascular:     Rate and Rhythm: Normal rate and regular rhythm.     Pulses: Normal pulses.     Heart sounds: Normal heart sounds. No murmur. No friction rub. No gallop.   Pulmonary:     Effort: Respiratory distress present.     Breath sounds: Rhonchi  present.     Comments: Mild wheeze.  Abdominal:     General: Bowel sounds are normal. There is no distension.     Palpations: Abdomen is soft.     Tenderness: There is no abdominal tenderness. There is no guarding.  Genitourinary:    Comments: No cva tenderness.  Musculoskeletal:        General: No swelling or tenderness.     Cervical back: Normal range of motion and neck supple. No rigidity. No muscular tenderness.     Right lower leg: No edema.     Left lower leg: No edema.  Skin:    General: Skin is warm and dry.     Findings: No rash.  Neurological:     Mental Status: She is alert.     Comments: Alert, speech normal.   Psychiatric:        Mood and Affect: Mood normal.     ED Results / Procedures / Treatments   Labs (all labs ordered are listed, but only abnormal results are displayed) Results for orders placed or performed during the hospital encounter of 09/22/19  Lactic acid, plasma  Result Value Ref Range   Lactic Acid, Venous 2.2 (HH) 0.5 - 1.9 mmol/L  CBC WITH DIFFERENTIAL  Result Value Ref Range   WBC 4.3 4.0 - 10.5 K/uL   RBC 4.64 3.87 - 5.11 MIL/uL   Hemoglobin 11.8 (L) 12.0 - 15.0 g/dL   HCT 37.5 36.0 - 46.0 %   MCV 80.8 80.0 - 100.0 fL   MCH 25.4 (L) 26.0 - 34.0 pg   MCHC 31.5 30.0 - 36.0 g/dL   RDW 14.8 11.5 - 15.5 %   Platelets 271 150 - 400 K/uL   nRBC 0.0 0.0 - 0.2 %   Neutrophils Relative % PENDING %   Neutro Abs PENDING 1.7 - 7.7 K/uL   Band Neutrophils PENDING %   Lymphocytes Relative PENDING %   Lymphs Abs PENDING 0.7 - 4.0 K/uL   Monocytes Relative PENDING %   Monocytes Absolute PENDING 0.1 - 1.0 K/uL   Eosinophils Relative PENDING %   Eosinophils Absolute PENDING 0.0 - 0.5 K/uL   Basophils Relative PENDING %   Basophils Absolute PENDING 0.0 - 0.1 K/uL   WBC Morphology PENDING    RBC Morphology PENDING    Smear Review PENDING    Other PENDING %   nRBC PENDING 0 /100 WBC   Metamyelocytes Relative PENDING %   Myelocytes PENDING %    Promyelocytes Relative PENDING %   Blasts PENDING %   Immature Granulocytes PENDING %   Abs Immature Granulocytes PENDING 0.00 - 0.07 K/uL  Comprehensive metabolic panel  Result Value Ref Range   Sodium 137 135 - 145 mmol/L   Potassium 4.8 3.5 - 5.1 mmol/L   Chloride 104 98 - 111 mmol/L   CO2 21 (L) 22 - 32 mmol/L   Glucose, Bld 236 (H) 70 - 99 mg/dL   BUN 22 8 - 23 mg/dL   Creatinine, Ser 1.29 (H) 0.44 - 1.00 mg/dL   Calcium 9.1 8.9 - 10.3 mg/dL   Total Protein 7.2 6.5 - 8.1 g/dL   Albumin 3.1 (L) 3.5 - 5.0 g/dL   AST 49 (H) 15 - 41 U/L   ALT 30 0 - 44 U/L   Alkaline Phosphatase 43 38 - 126 U/L   Total Bilirubin 0.7 0.3 - 1.2 mg/dL   GFR calc non Af Amer 41 (L) >60 mL/min   GFR calc Af Amer 48 (L) >60 mL/min   Anion gap 12 5 - 15  D-dimer, quantitative  Result Value Ref Range   D-Dimer, Quant 2.29 (H) 0.00 - 0.50 ug/mL-FEU  Lactate dehydrogenase  Result Value Ref Range   LDH 505 (H) 98 - 192 U/L  Fibrinogen  Result Value Ref Range   Fibrinogen 708 (H) 210 - 475 mg/dL  Triglycerides  Result Value Ref Range   Triglycerides 202 (H) <150 mg/dL   DG Chest Portable 1 View  Result Date: 09/19/2019 CLINICAL DATA:  Low sats. Additional provided: Patient reports dry cough that has now been bright red over the last 2 days with diarrhea and diaphoresis EXAM: PORTABLE CHEST 1 VIEW COMPARISON:  Chest radiograph 12/22/2018 FINDINGS: The heart is at the upper limits of normal for size, unchanged. Ill-defined opacity within the mid to lower left lung and at the right lung base. No sizable pleural effusion or evidence of pneumothorax. No acute bony abnormality IMPRESSION: Ill-defined opacity within the mid to lower left lung and right lung base, may reflect patchy edema or pneumonia. Radiographic follow-up to resolution recommended. Electronically Signed   By: Kellie Simmering DO   On: 09/19/2019 17:59    EKG None  Radiology No results found.  Procedures Procedures (including critical care  time)  Medications Ordered in ED Medications - No data to display  ED Course  I have reviewed the triage vital signs and the nursing notes.  Pertinent labs & imaging results that were available during my care of the patient were reviewed by me and considered in my medical decision making (see chart for details).    MDM Rules/Calculators/A&P   Iv ns. Continuous pulse ox and monitor.   Patient hypoxic on room air and 4 liters. Initially sats in 70s. 02 mask. Respiratory therapy consulted.   Albuterol and atrovent mdi. Decadron iv.   Reviewed nursing notes and prior charts for additional history.  COvid test from two days ago is positive. covid precautions.   Deborah Weeks was evaluated in Emergency Department on 09/22/2019 for the symptoms described in the history of present illness. She was evaluated in the context of the global COVID-19 pandemic, which necessitated consideration that the patient might be at risk for infection with the SARS-CoV-2 virus that causes COVID-19. Institutional protocols and algorithms that pertain to the evaluation of patients at risk for COVID-19 are in a state of rapid change based on information released by regulatory bodies including the CDC and federal and state organizations. These policies and algorithms were followed during the patient's care in the ED.  Additional labs sent.   Labs reviewed/interpreted by me - initial trop high. Iv ns bolus.   Additional labs reviewed/interpreted by me - renal fx c/w baseline, wbc normal. Inflammatory markers elevated c/w covid.   CXR reviewed/interpreted by me - worsening infiltrates c/w covid pna.   Respiratory therapy re-eval - titrate o2.  Post breathing tx, recheck - no wheezing.   Hospitalists consulted for admission.  CRITICAL CARE RE: COVID infection with acute respiratory failure with hypoxia, high flow o2, bil covid pna.  Performed by: Mirna Mires Total critical care time: 125  minutes Critical care time was exclusive of separately billable procedures and treating other patients. Critical care was necessary to treat or prevent imminent or life-threatening deterioration. Critical care was time spent personally by me on the following activities: development of treatment plan with patient and/or surrogate as well as nursing, discussions with consultants, evaluation of patient's response to treatment, examination of patient, obtaining history from patient or surrogate, ordering and performing treatments and interventions, ordering and review of laboratory studies, ordering and review of radiographic studies, pulse oximetry and re-evaluation of patient's condition.       Final Clinical Impression(s) / ED Diagnoses Final diagnoses:  None    Rx / DC Orders ED Discharge Orders    None       Lajean Saver, MD 09/22/19 1238

## 2019-09-22 NOTE — ED Notes (Signed)
[facility] called [facility] to transport pt, no eta

## 2019-09-23 DIAGNOSIS — U071 COVID-19: Principal | ICD-10-CM

## 2019-09-23 DIAGNOSIS — J9600 Acute respiratory failure, unspecified whether with hypoxia or hypercapnia: Secondary | ICD-10-CM

## 2019-09-23 LAB — GLUCOSE, CAPILLARY
Glucose-Capillary: 497 mg/dL — ABNORMAL HIGH (ref 70–99)
Glucose-Capillary: 468 mg/dL — ABNORMAL HIGH (ref 70–99)
Glucose-Capillary: 354 mg/dL — ABNORMAL HIGH (ref 70–99)

## 2019-09-23 MED ORDER — VANCOMYCIN HCL 10 G IV SOLR
2000.0000 mg | Freq: Once | INTRAVENOUS | Status: AC
  Administered 2019-09-23: 2000 mg via INTRAVENOUS
  Filled 2019-09-23: qty 2000

## 2019-09-23 MED ORDER — INSULIN DETEMIR 100 UNIT/ML ~~LOC~~ SOLN
10.0000 [IU] | Freq: Two times a day (BID) | SUBCUTANEOUS | Status: DC
  Administered 2019-09-23: 10 [IU] via SUBCUTANEOUS
  Filled 2019-09-23 (×2): qty 0.1

## 2019-09-23 MED ORDER — INSULIN DETEMIR 100 UNIT/ML ~~LOC~~ SOLN
15.0000 [IU] | Freq: Two times a day (BID) | SUBCUTANEOUS | Status: DC
  Administered 2019-09-23 – 2019-09-24 (×2): 15 [IU] via SUBCUTANEOUS
  Filled 2019-09-23 (×3): qty 0.15

## 2019-09-23 MED ORDER — INSULIN ASPART 100 UNIT/ML ~~LOC~~ SOLN
0.0000 [IU] | Freq: Three times a day (TID) | SUBCUTANEOUS | Status: DC
  Administered 2019-09-23 – 2019-09-24 (×3): 20 [IU] via SUBCUTANEOUS
  Administered 2019-09-24: 15 [IU] via SUBCUTANEOUS
  Administered 2019-09-25: 11 [IU] via SUBCUTANEOUS
  Administered 2019-09-25: 7 [IU] via SUBCUTANEOUS
  Administered 2019-09-25: 11 [IU] via SUBCUTANEOUS
  Administered 2019-09-26: 4 [IU] via SUBCUTANEOUS
  Administered 2019-09-26: 7 [IU] via SUBCUTANEOUS
  Administered 2019-09-26: 11 [IU] via SUBCUTANEOUS
  Administered 2019-09-27: 7 [IU] via SUBCUTANEOUS
  Administered 2019-09-27: 20 [IU] via SUBCUTANEOUS
  Administered 2019-09-27: 7 [IU] via SUBCUTANEOUS
  Administered 2019-09-28: 11 [IU] via SUBCUTANEOUS
  Administered 2019-09-28: 4 [IU] via SUBCUTANEOUS
  Administered 2019-09-28: 15 [IU] via SUBCUTANEOUS

## 2019-09-23 NOTE — Progress Notes (Signed)
PROGRESS NOTE    Deborah Weeks  [id number] DOB: December 31, 1945 DOA: 09/22/2019 PCP: Buzzy Han, MD    Brief Narrative:  73 year old female with history of hypertension, diabetes on Metformin at home, hyperlipidemia and lupus who presented to emergency department due to worsening cough and shortness of breath for 3 days.  Patient was diagnosed with COVID-19 2 days ago when she came to the emergency room.  Was discharged on oral Levaquin.  Patient continued to have poor appetite loose stools productive cough and worsening shortness of breath so reported back to the ER. In the emergency room, hemodynamically stable.  Temperature one 1.4.  Tachycardia and tachypnea.  Chest x-ray shows worsening multifocal pneumonia.  Admitted to the hospital with COVID-19 directed therapies.   Assessment & Plan:   Principal Problem:   Acute respiratory failure due to COVID-19 [facility] Active Problems:   DM (diabetes mellitus) ([place])   Hypertension   Systemic lupus erythematosus ([place])   HLD (hyperlipidemia)  Acute respiratory failure due to COVID-19 virus, hypoxemic respiratory failure, pneumonia due to COVID-19 virus: Continue to monitor due to significant symptoms. chest physiotherapy, incentive spirometry, deep breathing exercises, sputum induction, mucolytic's and bronchodilators. Supplemental oxygen to keep saturations more than 90%. Covid directed therapy with , steroids, on Solu-Medrol IV remdesivir, day 2/5 antibiotics, not indicated. Due to severity of symptoms, patient will need daily inflammatory markers, chest x-rays, liver function test to monitor and direct COVID-19 therapies.  Type 2 diabetes: On Metformin at home.  Poorly controlled.  Now severely exacerbated with use of steroids.  Increased dose of long-acting insulin, increase sliding scale insulin to high intensity.  Hypertension: Blood pressures are stable.  Depression: Stable.  On Remeron and Lexapro  continue.  Neuropathy: On Lyrica.  Stage III chronic kidney disease: Stable.  Monitor levels.  Recheck tomorrow morning.   DVT prophylaxis: SCDs, Lovenox Code Status: Full code Family Communication: Patient's daughter called and updated. Disposition Plan: Continue inpatient treatment.   Consultants:   None  Procedures:   None  Antimicrobials:   Remdesivir, 09/22/2019----   Subjective: Patient seen and examined.  No overnight events.  Patient herself stated that she has been more comfortable breathing.  Currently on 10 L high flow nasal cannula oxygen.  Remains afebrile.  Objective: Vitals:   09/22/19 2244 09/23/19 0410 09/23/19 0723 09/23/19 1206  BP: 125/64 113/64 131/74 (!) 113/57  Pulse: 71 63 66 68  Resp: 19 14 19 19   Temp: 98.7 F (37.1 C)  98.6 F (37 C) 98 F (36.7 C)  TempSrc: Oral Oral Oral Oral  SpO2:  90% 98% 91%  Weight: 97 kg     Height: 5\' 7"  (1.702 m)       Intake/Output Summary (Last 24 hours) at 09/23/2019 1341 Last data filed at 09/23/2019 0723 Gross per 24 hour  Intake --  Output 500 ml  Net -500 ml   Filed Weights   09/22/19 1131 09/22/19 2244  Weight: 94 kg 97 kg    Examination:  General exam: Appears calm and comfortable though on high flow oxygen. Respiratory system: Clear to auscultation. Respiratory effort normal.  No added sounds. Cardiovascular system: S1 & S2 heard, RRR. No JVD, murmurs, rubs, gallops or clicks. No pedal edema. Gastrointestinal system: Abdomen is nondistended, soft and nontender. No organomegaly or masses felt. Normal bowel sounds heard. Central nervous system: Alert and oriented. No focal neurological deficits. Extremities: Symmetric 5 x 5 power. Skin: No rashes, lesions or ulcers Psychiatry: Judgement and insight appear normal.  Mood & affect appropriate.     Data Reviewed: I have personally reviewed following labs and imaging studies  CBC: Recent Labs  Lab 09/19/19 1727 09/22/19 1114  09/22/19 2009  WBC 4.1 4.3 2.6*  NEUTROABS  --  3.1  --   HGB 12.1 11.8* 12.5  HCT 38.5 37.5 39.9  MCV 80.9 80.8 81.3  PLT 188 271 [id number]   Basic Metabolic Panel: Recent Labs  Lab 09/19/19 1727 09/22/19 1114 09/22/19 2009  NA 140 137  --   K 4.2 4.8  --   CL 104 104  --   CO2 24 21*  --   GLUCOSE 196* 236*  --   BUN 22 22  --   CREATININE 1.40* 1.29* 1.28*  CALCIUM 8.8* 9.1  --    GFR: Estimated Creatinine Clearance: 46.8 mL/min (A) (by C-G formula based on SCr of 1.28 mg/dL (H)). Liver Function Tests: Recent Labs  Lab 09/22/19 1114  AST 49*  ALT 30  ALKPHOS 43  BILITOT 0.7  PROT 7.2  ALBUMIN 3.1*   No results for input(s): LIPASE, AMYLASE in the last 168 hours. No results for input(s): AMMONIA in the last 168 hours. Coagulation Profile: No results for input(s): INR, PROTIME in the last 168 hours. Cardiac Enzymes: No results for input(s): CKTOTAL, CKMB, CKMBINDEX, TROPONINI in the last 168 hours. BNP (last 3 results) No results for input(s): PROBNP in the last 8760 hours. HbA1C: Recent Labs    09/22/19 2009  HGBA1C 7.5*   CBG: Recent Labs  Lab 09/22/19 1619 09/22/19 2211 09/23/19 1209  GLUCAP 270* 359* 497*   Lipid Profile: Recent Labs    09/22/19 1114 09/22/19 2009  CHOL  --  167  HDL  --  28*  LDLCALC  --  110*  TRIG 202* 147  CHOLHDL  --  6.0   Thyroid Function Tests: No results for input(s): TSH, T4TOTAL, FREET4, T3FREE, THYROIDAB in the last 72 hours. Anemia Panel: Recent Labs    09/22/19 1114  FERRITIN 1,483*   Sepsis Labs: Recent Labs  Lab 09/22/19 1114 09/22/19 2009  PROCALCITON 0.16  --   LATICACIDVEN 2.2* 1.8    Recent Results (from the past 240 hour(s))  SARS CORONAVIRUS 2 (TAT 6-24 HRS)     Status: Abnormal   Collection Time: 09/20/19  3:26 AM  Result Value Ref Range Status   SARS Coronavirus 2 POSITIVE (A) NEGATIVE Final    Comment: RESULT CALLED TO, READ BACK BY AND VERIFIED WITH: EMAILED Aretta Nip 0128 09/21/19  G.MCADOO (NOTE) SARS-CoV-2 target nucleic acids are DETECTED. The SARS-CoV-2 RNA is generally detectable in upper and lower respiratory specimens during the acute phase of infection. Positive results are indicative of the presence of SARS-CoV-2 RNA. Clinical correlation with patient history and other diagnostic information is  necessary to determine patient infection status. Positive results do not rule out bacterial infection or co-infection with other viruses.  The expected result is Negative. Fact Sheet for Patients: SugarRoll.be Fact Sheet for Healthcare Providers: https://www.woods-mathews.com/ This test is not yet approved or cleared by the [place] FDA and  has been authorized for detection and/or diagnosis of SARS-CoV-2 by FDA under an Emergency Use Authorization (EUA). This EUA will remain  in effect (meaning this test can be used)  for the duration of the COVID-19 declaration under Section 564(b)(1) of the Act, 21 U.S.C. section 360bbb-3(b)(1), unless the authorization is terminated or revoked sooner. Performed at [facility], [place] [address] [place], [place]  [address]   Culture, blood (Routine X 2) w Reflex to ID Panel     Status: None (Preliminary result)   Collection Time: 09/22/19  9:43 PM   Specimen: BLOOD  Result Value Ref Range Status   Specimen Description BLOOD RIGHT ANTECUBITAL  Final   Special Requests   Final    BOTTLES DRAWN AEROBIC AND ANAEROBIC Blood Culture adequate volume   Culture   Final    NO GROWTH < 12 HOURS Performed at [facility], [place] [address], [place], [place] [address]    Report Status PENDING  Incomplete         Radiology Studies: DG Chest Port 1 View  Result Date: 09/22/2019 CLINICAL DATA:  Pt was tx on wednesday for pneumonia, currently on antibiotics. Pt started having shortness of breath again today. Pt O2 sats @ 75% on 7 L [place]. Non rebreather mask placed on pt. Pt  currently at 100%. Pt wears 7L  at home Per triage notes 12/12 COVID + EXAM: PORTABLE CHEST 1 VIEW COMPARISON:  09/19/2019. FINDINGS: Bilateral airspace opacities have increased when compared to the prior exam, most evident on the left in the mid and lower lung, to a lesser degree in the right medial and inferior upper lobe and right lung base. No convincing pleural effusion.  No pneumothorax. Cardiac silhouette is top-normal in size. IMPRESSION: 1. Interval worsening of lung aeration an increase the bilateral airspace opacities, which are consistent with multifocal COVID-19 pneumonia. Electronically Signed   By: Lajean Manes M.D.   On: 09/22/2019 11:39        Scheduled Meds: . amLODipine  10 mg Oral Daily  . aspirin EC  81 mg Oral Daily  . carvedilol  25 mg Oral BID  . enoxaparin (LOVENOX) injection  40 mg Subcutaneous Q24H  . escitalopram  30 mg Oral Daily  . insulin aspart  0-20 Units Subcutaneous TID WC  . insulin aspart  0-5 Units Subcutaneous QHS  . insulin detemir  15 Units Subcutaneous BID  . methylPREDNISolone (SOLU-MEDROL) injection  47 mg Intravenous Q12H  . mirtazapine  7.5 mg Oral QHS  . pravastatin  10 mg Oral Daily  . pregabalin  75 mg Oral TID  . vitamin C  500 mg Oral Daily  . zinc sulfate  220 mg Oral Daily   Continuous Infusions: . remdesivir 100 mg in NS 100 mL 100 mg (09/23/19 1031)     LOS: 1 day    Time spent: 25 minutes    Barb Merino, MD [facility] Pager [phone]

## 2019-09-23 NOTE — Progress Notes (Signed)
Paged Dr. Barb Merino with a blood glucose of 468 for the patient.  Novolog 20 units given per order on the [facility].  MD acknowledged results and had no new orders at this time.

## 2019-09-23 NOTE — Progress Notes (Signed)
Spoke with daughter Lattie Haw and updated her on the patient's condition.  All questions answered at this time.

## 2019-09-23 NOTE — Progress Notes (Signed)
PHARMACY - PHYSICIAN COMMUNICATION CRITICAL VALUE ALERT - BLOOD CULTURE IDENTIFICATION (BCID)  Deborah Weeks is an 73 y.o. female who presented to [facility] on 09/22/2019 .  Assessment:  1/2 Blood cultures positive: GPC  Name of physician (or Provider) Contacted: [facility]  Current antibiotics: None  Changes to prescribed antibiotics recommended:  -Add vancomycin loading dose, vancomycin 2 g IV x1 -F/u cultures to determine if contaminant or pathogen  No results found for this or any previous visit.  Harvel Quale 09/23/2019  3:52 PM

## 2019-09-23 NOTE — Progress Notes (Signed)
Texted and paged MD with BG of 497.  Received no new orders when MD responded with acknowlegdement.  15 units of Novolog given as ordered in the [facility].

## 2019-09-24 LAB — COMPREHENSIVE METABOLIC PANEL
ALT: 30 U/L (ref 0–44)
AST: 42 U/L — ABNORMAL HIGH (ref 15–41)
Albumin: 3.2 g/dL — ABNORMAL LOW (ref 3.5–5.0)
Alkaline Phosphatase: 52 U/L (ref 38–126)
Anion gap: 13 (ref 5–15)
BUN: 43 mg/dL — ABNORMAL HIGH (ref 8–23)
CO2: 22 mmol/L (ref 22–32)
Calcium: 9.7 mg/dL (ref 8.9–10.3)
Chloride: 108 mmol/L (ref 98–111)
Creatinine, Ser: 1.11 mg/dL — ABNORMAL HIGH (ref 0.44–1.00)
GFR calc Af Amer: 57 mL/min — ABNORMAL LOW (ref >60)
GFR calc non Af Amer: 49 mL/min — ABNORMAL LOW (ref >60)
Glucose, Bld: 322 mg/dL — ABNORMAL HIGH (ref 70–99)
Potassium: 4.7 mmol/L (ref 3.5–5.1)
Sodium: 143 mmol/L (ref 135–145)
Total Bilirubin: 0.6 mg/dL (ref 0.3–1.2)
Total Protein: 7.1 g/dL (ref 6.5–8.1)

## 2019-09-24 LAB — CBC WITH DIFFERENTIAL/PLATELET
Abs Immature Granulocytes: 0.07 10*3/uL (ref 0.00–0.07)
Basophils Absolute: 0 10*3/uL (ref 0.0–0.1)
Basophils Relative: 0 %
Eosinophils Absolute: 0 10*3/uL (ref 0.0–0.5)
Eosinophils Relative: 0 %
HCT: 40.5 % (ref 36.0–46.0)
Hemoglobin: 12.3 g/dL (ref 12.0–15.0)
Immature Granulocytes: 1 %
Lymphocytes Relative: 18 %
Lymphs Abs: 1.8 10*3/uL (ref 0.7–4.0)
MCH: 25.1 pg — ABNORMAL LOW (ref 26.0–34.0)
MCHC: 30.4 g/dL (ref 30.0–36.0)
MCV: 82.5 fL (ref 80.0–100.0)
Monocytes Absolute: 0.5 10*3/uL (ref 0.1–1.0)
Monocytes Relative: 5 %
Neutro Abs: 7.8 10*3/uL — ABNORMAL HIGH (ref 1.7–7.7)
Neutrophils Relative %: 76 %
Platelets: 380 10*3/uL (ref 150–400)
RBC: 4.91 MIL/uL (ref 3.87–5.11)
RDW: 15.4 % (ref 11.5–15.5)
WBC: 10.1 10*3/uL (ref 4.0–10.5)
nRBC: 0.2 % (ref 0.0–0.2)

## 2019-09-24 LAB — GLUCOSE, CAPILLARY
Glucose-Capillary: 312 mg/dL — ABNORMAL HIGH (ref 70–99)
Glucose-Capillary: 337 mg/dL — ABNORMAL HIGH (ref 70–99)
Glucose-Capillary: 412 mg/dL — ABNORMAL HIGH (ref 70–99)
Glucose-Capillary: 537 mg/dL (ref 70–99)
Glucose-Capillary: 394 mg/dL — ABNORMAL HIGH (ref 70–99)

## 2019-09-24 LAB — C-REACTIVE PROTEIN: CRP: 6.7 mg/dL — ABNORMAL HIGH (ref <1.0)

## 2019-09-24 LAB — FERRITIN: Ferritin: 1153 ng/mL — ABNORMAL HIGH (ref 11–307)

## 2019-09-24 LAB — MAGNESIUM: Magnesium: 2.2 mg/dL (ref 1.7–2.4)

## 2019-09-24 LAB — D-DIMER, QUANTITATIVE: D-Dimer, Quant: 2.1 ug/mL-FEU — ABNORMAL HIGH (ref 0.00–0.50)

## 2019-09-24 MED ORDER — INSULIN DETEMIR 100 UNIT/ML ~~LOC~~ SOLN
18.0000 [IU] | Freq: Two times a day (BID) | SUBCUTANEOUS | Status: DC
  Filled 2019-09-24: qty 0.18

## 2019-09-24 MED ORDER — DEXAMETHASONE SODIUM PHOSPHATE 10 MG/ML IJ SOLN
8.0000 mg | INTRAMUSCULAR | Status: DC
  Administered 2019-09-24 – 2019-09-25 (×2): 8 mg via INTRAVENOUS
  Filled 2019-09-24 (×2): qty 1

## 2019-09-24 MED ORDER — GUAIFENESIN-DM 100-10 MG/5ML PO SYRP
10.0000 mL | ORAL_SOLUTION | Freq: Four times a day (QID) | ORAL | Status: DC | PRN
  Administered 2019-09-24 – 2019-09-29 (×9): 10 mL via ORAL
  Filled 2019-09-24 (×9): qty 10

## 2019-09-24 MED ORDER — INSULIN ASPART 100 UNIT/ML ~~LOC~~ SOLN: 8.0000 [IU] | Freq: Three times a day (TID) | SUBCUTANEOUS | Status: DC

## 2019-09-24 MED ORDER — INSULIN DETEMIR 100 UNIT/ML ~~LOC~~ SOLN
25.0000 [IU] | Freq: Two times a day (BID) | SUBCUTANEOUS | Status: DC
  Administered 2019-09-24 – 2019-09-27 (×6): 25 [IU] via SUBCUTANEOUS
  Filled 2019-09-24 (×7): qty 0.25

## 2019-09-24 MED ORDER — INSULIN ASPART 100 UNIT/ML ~~LOC~~ SOLN
15.0000 [IU] | Freq: Three times a day (TID) | SUBCUTANEOUS | Status: DC
  Administered 2019-09-24 – 2019-09-26 (×7): 15 [IU] via SUBCUTANEOUS

## 2019-09-24 NOTE — Progress Notes (Signed)
Inpatient Diabetes Program Recommendations  AACE/ADA: New Consensus Statement on Inpatient Glycemic Control (2015)  Target Ranges:  Prepandial:   less than 140 mg/dL      Peak postprandial:   less than 180 mg/dL (1-2 hours)      Critically ill patients:  140 - 180 mg/dL   Lab Results  Component Value Date   GLUCAP 337 (H) 09/24/2019   HGBA1C 7.5 (H) 09/22/2019    Review of Glycemic Control  Diabetes history: DM2 Outpatient Diabetes medications: metformin 1000 mg bid Current orders for Inpatient glycemic control: Levemir 15 units bid, Novolog 0-20 units tidwc and 0-5 units QHS  On Solumedrol 47 mg Q12H HgbA1C - 7.5% - good glycemic control  Inpatient Diabetes Program Recommendations:     Increase Levemir to 18 units bid  Add meal coverage insulin - Novolog 6 units tidwc, if pt eats > 50% meal.  Will continue to follow daily.  Thank you. Lorenda Peck, RD, LDN, CDE Inpatient Diabetes Coordinator [phone]

## 2019-09-24 NOTE — Progress Notes (Addendum)
Pt oob to BSC O2 dropped to 85%. Patient now 94% on 10L HFNC. WIll continue to monitor.

## 2019-09-24 NOTE — Progress Notes (Signed)
Called and spoke with daughter and gave updates.  Answered all questions.

## 2019-09-24 NOTE — Progress Notes (Signed)
Notified spouse (Cooper,Lisa ) of progress all questions answered and this nurse's number shared for further communication.

## 2019-09-24 NOTE — Progress Notes (Signed)
PROGRESS NOTE    Deborah Weeks  [id number] DOB: May 07, 1946 DOA: 09/22/2019 PCP: Buzzy Han, MD    Brief Narrative:  73 year old female with history of hypertension, diabetes on Metformin at home, hyperlipidemia and lupus who presented to emergency department due to worsening cough and shortness of breath for 3 days.  Patient was diagnosed with COVID-19, 2 days ago when she came to the emergency room.  Was discharged on oral Levaquin.  Patient continued to have poor appetite loose stools productive cough and worsening shortness of breath so reported back to the ER. In the emergency room, hemodynamically stable.  Temperature 101.4 Tachycardia and tachypnea.  Chest x-ray shows worsening multifocal pneumonia.  Admitted to the hospital with COVID-19 directed therapies.   Assessment & Plan:   Principal Problem:   Acute respiratory failure due to COVID-19 [facility]) Active Problems:   DM (diabetes mellitus) ([place])   Hypertension   Systemic lupus erythematosus ([place])   HLD (hyperlipidemia)  Acute respiratory failure due to COVID-19 virus, hypoxemic respiratory failure, pneumonia due to COVID-19 virus: Continue to monitor due to significant symptoms.  Patient is still on significant oxygen. chest physiotherapy, incentive spirometry, deep breathing exercises, sputum induction, mucolytic's and bronchodilators. Supplemental oxygen to keep saturations more than 90%. Covid directed therapy with , steroids, on Solu-Medrol IV remdesivir, day 3/5 antibiotics, not indicated. Due to severity of symptoms, patient will need daily inflammatory markers, chest x-rays, liver function test to monitor and direct COVID-19 therapies.  Type 2 diabetes: On Metformin at home.  Poorly controlled.  Now severely exacerbated with use of steroids.  Increased dose of long-acting insulin, increase sliding scale insulin to high intensity. We will add prandial insulin today.  Hypertension: Blood  pressures are stable.  Depression: Stable.  On Remeron and Lexapro continue.  Neuropathy: On Lyrica.  Stage III chronic kidney disease: Stable.  Monitor levels.  DVT prophylaxis: SCDs, Lovenox Code Status: Full code Family Communication: Patient's daughter. Disposition Plan: Continue inpatient treatment.   Consultants:   None  Procedures:   None  Antimicrobials:   Remdesivir, 09/22/2019----   Subjective: Patient seen and examined.  Has some cough.  Remains afebrile.  Kept on high flow nasal cannula oxygen, however saturating adequate.  Subjectively improving.  Has not ambulated yet.  Objective: Vitals:   09/24/19 0422 09/24/19 0729 09/24/19 0738 09/24/19 1121  BP:   116/64 (!) 110/58  Pulse: 60  66   Resp: 13 17 19    Temp:   98 F (36.7 C) 98.1 F (36.7 C)  TempSrc:   Oral Oral  SpO2: 93%  99%   Weight:      Height:        Intake/Output Summary (Last 24 hours) at 09/24/2019 1249 Last data filed at 09/24/2019 0955 Gross per 24 hour  Intake 300 ml  Output --  Net 300 ml   Filed Weights   09/22/19 1131 09/22/19 2244  Weight: 94 kg 97 kg    Examination:  General exam: Appears calm and comfortable though on high flow oxygen. Respiratory system: Clear to auscultation. Respiratory effort normal.  No added sounds. Cardiovascular system: S1 & S2 heard, RRR. No JVD, murmurs, rubs, gallops or clicks. No pedal edema. Gastrointestinal system: Abdomen is nondistended, soft and nontender. No organomegaly or masses felt. Normal bowel sounds heard. Central nervous system: Alert and oriented. No focal neurological deficits. Extremities: Symmetric 5 x 5 power. Skin: No rashes, lesions or ulcers Psychiatry: Judgement and insight appear normal. Mood & affect appropriate.  Data Reviewed: I have personally reviewed following labs and imaging studies  CBC: Recent Labs  Lab 09/19/19 1727 09/22/19 1114 09/22/19 2009 09/24/19 0350  WBC 4.1 4.3 2.6* 10.1   NEUTROABS  --  3.1  --  7.8*  HGB 12.1 11.8* 12.5 12.3  HCT 38.5 37.5 39.9 40.5  MCV 80.9 80.8 81.3 82.5  PLT 188 271 273 [id number]   Basic Metabolic Panel: Recent Labs  Lab 09/19/19 1727 09/22/19 1114 09/22/19 2009 09/24/19 0350  NA 140 137  --  143  K 4.2 4.8  --  4.7  CL 104 104  --  108  CO2 24 21*  --  22  GLUCOSE 196* 236*  --  322*  BUN 22 22  --  43*  CREATININE 1.40* 1.29* 1.28* 1.11*  CALCIUM 8.8* 9.1  --  9.7  MG  --   --   --  2.2   GFR: Estimated Creatinine Clearance: 54 mL/min (A) (by C-G formula based on SCr of 1.11 mg/dL (H)). Liver Function Tests: Recent Labs  Lab 09/22/19 1114 09/24/19 0350  AST 49* 42*  ALT 30 30  ALKPHOS 43 52  BILITOT 0.7 0.6  PROT 7.2 7.1  ALBUMIN 3.1* 3.2*   No results for input(s): LIPASE, AMYLASE in the last 168 hours. No results for input(s): AMMONIA in the last 168 hours. Coagulation Profile: No results for input(s): INR, PROTIME in the last 168 hours. Cardiac Enzymes: No results for input(s): CKTOTAL, CKMB, CKMBINDEX, TROPONINI in the last 168 hours. BNP (last 3 results) No results for input(s): PROBNP in the last 8760 hours. HbA1C: Recent Labs    09/22/19 2009  HGBA1C 7.5*   CBG: Recent Labs  Lab 09/23/19 1209 09/23/19 1549 09/23/19 2023 09/24/19 0738 09/24/19 1119  GLUCAP 497* 468* 354* 337* 412*   Lipid Profile: Recent Labs    09/22/19 1114 09/22/19 2009  CHOL  --  167  HDL  --  28*  LDLCALC  --  110*  TRIG 202* 147  CHOLHDL  --  6.0   Thyroid Function Tests: No results for input(s): TSH, T4TOTAL, FREET4, T3FREE, THYROIDAB in the last 72 hours. Anemia Panel: Recent Labs    09/22/19 1114 09/24/19 0350  FERRITIN 1,483* 1,153*   Sepsis Labs: Recent Labs  Lab 09/22/19 1114 09/22/19 2009  PROCALCITON 0.16  --   LATICACIDVEN 2.2* 1.8    Recent Results (from the past 240 hour(s))  SARS CORONAVIRUS 2 (TAT 6-24 HRS)     Status: Abnormal   Collection Time: 09/20/19  3:26 AM  Result Value Ref  Range Status   SARS Coronavirus 2 POSITIVE (A) NEGATIVE Final    Comment: RESULT CALLED TO, READ BACK BY AND VERIFIED WITH: EMAILED Aretta Nip 0128 09/21/19 G.MCADOO (NOTE) SARS-CoV-2 target nucleic acids are DETECTED. The SARS-CoV-2 RNA is generally detectable in upper and lower respiratory specimens during the acute phase of infection. Positive results are indicative of the presence of SARS-CoV-2 RNA. Clinical correlation with patient history and other diagnostic information is  necessary to determine patient infection status. Positive results do not rule out bacterial infection or co-infection with other viruses.  The expected result is Negative. Fact Sheet for Patients: SugarRoll.be Fact Sheet for Healthcare Providers: https://www.woods-mathews.com/ This test is not yet approved or cleared by the [place] FDA and  has been authorized for detection and/or diagnosis of SARS-CoV-2 by FDA under an Emergency Use Authorization (EUA). This EUA will remain  in effect (meaning this test can be  used)  for the duration of the COVID-19 declaration under Section 564(b)(1) of the Act, 21 U.S.C. section 360bbb-3(b)(1), unless the authorization is terminated or revoked sooner. Performed at [facility], [place] [address], [place], [place] [address]   Culture, blood (Routine X 2) w Reflex to ID Panel     Status: Abnormal (Preliminary result)   Collection Time: 09/22/19  9:43 PM   Specimen: BLOOD  Result Value Ref Range Status   Specimen Description BLOOD RIGHT ANTECUBITAL  Final   Special Requests   Final    BOTTLES DRAWN AEROBIC AND ANAEROBIC Blood Culture adequate volume   Culture  Setup Time   Final    GRAM POSITIVE COCCI AEROBIC BOTTLE ONLY CRITICAL RESULT CALLED TO, READ BACK BY AND VERIFIED WITH: PHARMD MASTERS, A [id number] FCP    Culture (A)  Final    STAPHYLOCOCCUS SPECIES (COAGULASE NEGATIVE) THE SIGNIFICANCE OF ISOLATING THIS  ORGANISM FROM A SINGLE VENIPUNCTURE CANNOT BE PREDICTED WITHOUT FURTHER CLINICAL AND CULTURE CORRELATION. SUSCEPTIBILITIES AVAILABLE ONLY ON REQUEST. Performed at [facility], [place] [address] [place], [place] [address]    Report Status PENDING  Incomplete         Radiology Studies: No results found.      Scheduled Meds: . amLODipine  10 mg Oral Daily  . aspirin EC  81 mg Oral Daily  . carvedilol  25 mg Oral BID  . enoxaparin (LOVENOX) injection  40 mg Subcutaneous Q24H  . escitalopram  30 mg Oral Daily  . insulin aspart  0-20 Units Subcutaneous TID WC  . insulin aspart  0-5 Units Subcutaneous QHS  . insulin aspart  8 Units Subcutaneous TID WC  . insulin detemir  18 Units Subcutaneous BID  . methylPREDNISolone (SOLU-MEDROL) injection  47 mg Intravenous Q12H  . mirtazapine  7.5 mg Oral QHS  . pravastatin  10 mg Oral Daily  . pregabalin  75 mg Oral TID  . vitamin C  500 mg Oral Daily  . zinc sulfate  220 mg Oral Daily   Continuous Infusions: . remdesivir 100 mg in NS 100 mL 100 mg (09/24/19 1011)     LOS: 2 days    Time spent: 25 minutes    Barb Merino, MD [facility] Pager [phone]

## 2019-09-24 NOTE — Progress Notes (Signed)
Patient bg 412.  Dr Sloan Leiter notified. No new orders given.

## 2019-09-25 LAB — GLUCOSE, CAPILLARY
Glucose-Capillary: 284 mg/dL — ABNORMAL HIGH (ref 70–99)
Glucose-Capillary: 233 mg/dL — ABNORMAL HIGH (ref 70–99)
Glucose-Capillary: 297 mg/dL — ABNORMAL HIGH (ref 70–99)
Glucose-Capillary: 372 mg/dL — ABNORMAL HIGH (ref 70–99)

## 2019-09-25 LAB — COMPREHENSIVE METABOLIC PANEL
ALT: 26 U/L (ref 0–44)
AST: 31 U/L (ref 15–41)
Albumin: 2.9 g/dL — ABNORMAL LOW (ref 3.5–5.0)
Alkaline Phosphatase: 55 U/L (ref 38–126)
Anion gap: 11 (ref 5–15)
BUN: 42 mg/dL — ABNORMAL HIGH (ref 8–23)
CO2: 25 mmol/L (ref 22–32)
Calcium: 9.5 mg/dL (ref 8.9–10.3)
Chloride: 107 mmol/L (ref 98–111)
Creatinine, Ser: 1.01 mg/dL — ABNORMAL HIGH (ref 0.44–1.00)
GFR calc Af Amer: >60 mL/min (ref >60)
GFR calc non Af Amer: 55 mL/min — ABNORMAL LOW (ref >60)
Glucose, Bld: 283 mg/dL — ABNORMAL HIGH (ref 70–99)
Potassium: 4.7 mmol/L (ref 3.5–5.1)
Sodium: 143 mmol/L (ref 135–145)
Total Bilirubin: 0.9 mg/dL (ref 0.3–1.2)
Total Protein: 6.6 g/dL (ref 6.5–8.1)

## 2019-09-25 LAB — CBC WITH DIFFERENTIAL/PLATELET
Abs Immature Granulocytes: 0.08 10*3/uL — ABNORMAL HIGH (ref 0.00–0.07)
Basophils Absolute: 0 10*3/uL (ref 0.0–0.1)
Basophils Relative: 0 %
Eosinophils Absolute: 0 10*3/uL (ref 0.0–0.5)
Eosinophils Relative: 0 %
HCT: 38.3 % (ref 36.0–46.0)
Hemoglobin: 11.7 g/dL — ABNORMAL LOW (ref 12.0–15.0)
Immature Granulocytes: 1 %
Lymphocytes Relative: 13 %
Lymphs Abs: 1.3 10*3/uL (ref 0.7–4.0)
MCH: 24.9 pg — ABNORMAL LOW (ref 26.0–34.0)
MCHC: 30.5 g/dL (ref 30.0–36.0)
MCV: 81.5 fL (ref 80.0–100.0)
Monocytes Absolute: 0.4 10*3/uL (ref 0.1–1.0)
Monocytes Relative: 4 %
Neutro Abs: 7.7 10*3/uL (ref 1.7–7.7)
Neutrophils Relative %: 82 %
Platelets: 374 10*3/uL (ref 150–400)
RBC: 4.7 MIL/uL (ref 3.87–5.11)
RDW: 15.2 % (ref 11.5–15.5)
WBC: 9.5 10*3/uL (ref 4.0–10.5)
nRBC: 0.2 % (ref 0.0–0.2)

## 2019-09-25 LAB — MAGNESIUM: Magnesium: 2.2 mg/dL (ref 1.7–2.4)

## 2019-09-25 LAB — C-REACTIVE PROTEIN: CRP: 2.6 mg/dL — ABNORMAL HIGH (ref <1.0)

## 2019-09-25 LAB — D-DIMER, QUANTITATIVE: D-Dimer, Quant: 2.1 ug/mL-FEU — ABNORMAL HIGH (ref 0.00–0.50)

## 2019-09-25 LAB — CULTURE, BLOOD (ROUTINE X 2): Special Requests: ADEQUATE

## 2019-09-25 LAB — FERRITIN: Ferritin: 613 ng/mL — ABNORMAL HIGH (ref 11–307)

## 2019-09-25 MED ORDER — SENNOSIDES-DOCUSATE SODIUM 8.6-50 MG PO TABS
1.0000 | ORAL_TABLET | Freq: Two times a day (BID) | ORAL | Status: DC
  Administered 2019-09-25 – 2019-09-29 (×8): 1 via ORAL
  Filled 2019-09-25 (×8): qty 1

## 2019-09-25 NOTE — Progress Notes (Signed)
Notified daughter of progress all questions answered and this nurse's number shared for further communication.

## 2019-09-25 NOTE — Evaluation (Signed)
Physical Therapy Evaluation Patient Details Name: Deborah Weeks MRN: [id number] DOB: Feb 12, 1946 Today's Date: 09/25/2019   History of Present Illness  73 year old female with history of hypertension, diabetes on Metformin at home, hyperlipidemia and lupus who presented to emergency department due to worsening cough and shortness of breath for 3 days, 12/10 . Pt d/c home but retirend with poor appetite loose stools productive cough and worsening shortness of breath. amitted with COVID 19 directed therapies.  Clinical Impression   Pt admitted with above diagnosis. PTA was very independent and living home with daughter. Pt currently with functional limitations due to the deficits listed below (see PT Problem List). This pm does well with mobility needing SBa-min guard assist with mobility, was able to ambulate approx 165ft with HHA and min guard assist on 4L/min via HFNC noted min desat to 88%. Pt will benefit from skilled PT to increase their independence and safety with mobility to allow discharge to the venue listed below.       Follow Up Recommendations Home health PT    Equipment Recommendations  (TBD)    Recommendations for Other Services OT consult     Precautions / Restrictions Precautions Precautions: Fall Restrictions Weight Bearing Restrictions: No      Mobility  Bed Mobility Overal bed mobility: Needs Assistance Bed Mobility: Supine to Sit;Sit to Supine     Supine to sit: Supervision Sit to supine: Supervision      Transfers Overall transfer level: Needs assistance Equipment used: None Transfers: Sit to/from [employer] Transfers Sit to Stand: Min guard;Supervision Stand pivot transfers: Min guard;Supervision          Ambulation/Gait Ambulation/Gait assistance: Min guard Gait Distance (Feet): 150 Feet Assistive device: 1 person hand held assist Gait Pattern/deviations: Step-through pattern     General Gait Details: ambulated approx  172ft with HHA and min guard assist w/ 4L/min via HFNC desat to min 88%  Stairs            Wheelchair Mobility    Modified Rankin (Stroke Patients Only)       Balance Overall balance assessment: Mild deficits observed, not formally tested                                           Pertinent Vitals/Pain Pain Assessment: No/denies pain    Home Living Family/patient expects to be discharged to:: Private residence Living Arrangements: Children Available Help at Discharge: Family Type of Home: House Home Access: Ramped entrance     Home Layout: One level Home Equipment: Cane - single point      Prior Function Level of Independence: Independent               Hand Dominance   Dominant Hand: Right    Extremity/Trunk Assessment   Upper Extremity Assessment Upper Extremity Assessment: Defer to OT evaluation    Lower Extremity Assessment Lower Extremity Assessment: Generalized weakness    Cervical / Trunk Assessment Cervical / Trunk Assessment: Normal  Communication   Communication: No difficulties  Cognition Arousal/Alertness: Awake/alert Behavior During Therapy: WFL for tasks assessed/performed Overall Cognitive Status: Within Functional Limits for tasks assessed                                        General Comments  Exercises     Assessment/Plan    PT Assessment Patient needs continued PT services  PT Problem List Decreased strength;Decreased activity tolerance;Decreased balance;Decreased mobility;Decreased safety awareness;Decreased coordination       PT Treatment Interventions Gait training;Functional mobility training;Therapeutic activities;Therapeutic exercise;Balance training;Neuromuscular re-education;Patient/family education    PT Goals (Current goals can be found in the Care Plan section)  Acute Rehab PT Goals Patient Stated Goal: go home PT Goal Formulation: With patient Time For Goal  Achievement: 10/09/19 Potential to Achieve Goals: Good    Frequency Min 3X/week   Barriers to discharge        Co-evaluation               AM-PAC PT "6 Clicks" Mobility  Outcome Measure Help needed turning from your back to your side while in a flat bed without using bedrails?: None Help needed moving from lying on your back to sitting on the side of a flat bed without using bedrails?: None Help needed moving to and from a bed to a chair (including a wheelchair)?: A Little Help needed standing up from a chair using your arms (e.g., wheelchair or bedside chair)?: A Little Help needed to walk in hospital room?: A Little Help needed climbing 3-5 steps with a railing? : A Lot 6 Click Score: 19    End of Session Equipment Utilized During Treatment: Oxygen Activity Tolerance: Patient limited by fatigue Patient left: in bed;with call bell/phone within reach Nurse Communication: Mobility status PT Visit Diagnosis: Unsteadiness on feet (R26.81);Other abnormalities of gait and mobility (R26.89);Muscle weakness (generalized) (M62.81)    Time: [id number] PT Time Calculation (min) (ACUTE ONLY): 19 min   Charges:   PT Evaluation $PT Eval Moderate Complexity: [place], PT   Delford Field 09/25/2019, 5:05 PM

## 2019-09-25 NOTE — Progress Notes (Signed)
PROGRESS NOTE    Deborah Weeks  [id number] DOB: 07-Apr-1946 DOA: 09/22/2019 PCP: Buzzy Han, MD    Brief Narrative:  73 year old female with history of hypertension, diabetes on Metformin at home, hyperlipidemia and lupus who presented to emergency department due to worsening cough and shortness of breath for 3 days.  Patient was diagnosed with COVID-19, 2 days ago when she came to the emergency room.  Was discharged on oral Levaquin.  Patient continued to have poor appetite loose stools productive cough and worsening shortness of breath so reported back to the ER. In the emergency room, hemodynamically stable.  Temperature 101.4 Tachycardia and tachypnea.  Chest x-ray shows worsening multifocal pneumonia.  Admitted to the hospital with COVID-19 directed therapies.   Assessment & Plan:   Principal Problem:   Acute respiratory failure due to COVID-19 [facility]) Active Problems:   DM (diabetes mellitus) ([place])   Hypertension   Systemic lupus erythematosus ([place])   HLD (hyperlipidemia)  Acute respiratory failure due to COVID-19 virus, hypoxemic respiratory failure, pneumonia due to COVID-19 virus: Continue to monitor due to significant symptoms.  Her oxygen requirements improving. chest physiotherapy, incentive spirometry, deep breathing exercises, sputum induction, mucolytic's and bronchodilators. Supplemental oxygen to keep saturations more than 90%. Covid directed therapy with , steroids, on Solu-Medrol IV remdesivir, day 4/5 antibiotics, not indicated. Due to severity of symptoms, patient will need daily inflammatory markers, liver function test to monitor and direct COVID-19 therapies.  Type 2 diabetes: On Metformin at home.  Poorly controlled.  Now severely exacerbated with use of steroids.  Increased dose of long-acting insulin, increase sliding scale insulin to high intensity.  Hypertension: Blood pressures are stable.  Depression: Stable.  On Remeron and  Lexapro continue.  Neuropathy: On Lyrica.  Stage III chronic kidney disease: Stable.  Monitor levels.  DVT prophylaxis: SCDs, Lovenox Code Status: Full code Family Communication: Patient's daughter was on the phone with patient. Disposition Plan: Continue inpatient treatment.   Consultants:   None  Procedures:   None  Antimicrobials:   Remdesivir, 09/22/2019----   Subjective: Seen and examined.  Remains afebrile.  Feels comfortable at rest.  Has not ambulated.  On 2 to 4 L of oxygen.  Objective: Vitals:   09/24/19 1900 09/25/19 0355 09/25/19 0807 09/25/19 1206  BP: 115/64 119/68 (!) 115/59 (!) 112/56  Pulse: 63 63 60 (!) 57  Resp: 15 18 15 15   Temp: 98.8 F (37.1 C) 98 F (36.7 C) 98.2 F (36.8 C) 97.9 F (36.6 C)  TempSrc: Oral Oral Oral Oral  SpO2: 92% 90% 90% 92%  Weight:      Height:        Intake/Output Summary (Last 24 hours) at 09/25/2019 1319 Last data filed at 09/25/2019 1031 Gross per 24 hour  Intake 240 ml  Output 1050 ml  Net -810 ml   Filed Weights   09/22/19 1131 09/22/19 2244  Weight: 94 kg 97 kg    Examination:  General exam: Appears calm and comfortable, on 4 L oxygen. Respiratory system: Clear to auscultation. Respiratory effort normal.  No added sounds. Cardiovascular system: S1 & S2 heard, RRR. No JVD, murmurs, rubs, gallops or clicks. No pedal edema. Gastrointestinal system: Abdomen is nondistended, soft and nontender. No organomegaly or masses felt. Normal bowel sounds heard. Central nervous system: Alert and oriented. No focal neurological deficits. Extremities: Symmetric 5 x 5 power. Skin: No rashes, lesions or ulcers Psychiatry: Judgement and insight appear normal. Mood & affect appropriate.     Data  Reviewed: I have personally reviewed following labs and imaging studies  CBC: Recent Labs  Lab 09/19/19 1727 09/22/19 1114 09/22/19 2009 09/24/19 0350 09/25/19 0340  WBC 4.1 4.3 2.6* 10.1 9.5  NEUTROABS  --  3.1  --   7.8* 7.7  HGB 12.1 11.8* 12.5 12.3 11.7*  HCT 38.5 37.5 39.9 40.5 38.3  MCV 80.9 80.8 81.3 82.5 81.5  PLT 188 271 273 380 [id number]   Basic Metabolic Panel: Recent Labs  Lab 09/19/19 1727 09/22/19 1114 09/22/19 2009 09/24/19 0350 09/25/19 0340  NA 140 137  --  143 143  K 4.2 4.8  --  4.7 4.7  CL 104 104  --  108 107  CO2 24 21*  --  22 25  GLUCOSE 196* 236*  --  322* 283*  BUN 22 22  --  43* 42*  CREATININE 1.40* 1.29* 1.28* 1.11* 1.01*  CALCIUM 8.8* 9.1  --  9.7 9.5  MG  --   --   --  2.2 2.2   GFR: Estimated Creatinine Clearance: 59.4 mL/min (A) (by C-G formula based on SCr of 1.01 mg/dL (H)). Liver Function Tests: Recent Labs  Lab 09/22/19 1114 09/24/19 0350 09/25/19 0340  AST 49* 42* 31  ALT 30 30 26   ALKPHOS 43 52 55  BILITOT 0.7 0.6 0.9  PROT 7.2 7.1 6.6  ALBUMIN 3.1* 3.2* 2.9*   No results for input(s): LIPASE, AMYLASE in the last 168 hours. No results for input(s): AMMONIA in the last 168 hours. Coagulation Profile: No results for input(s): INR, PROTIME in the last 168 hours. Cardiac Enzymes: No results for input(s): CKTOTAL, CKMB, CKMBINDEX, TROPONINI in the last 168 hours. BNP (last 3 results) No results for input(s): PROBNP in the last 8760 hours. HbA1C: Recent Labs    09/22/19 2009  HGBA1C 7.5*   CBG: Recent Labs  Lab 09/24/19 1119 09/24/19 1545 09/24/19 2153 09/25/19 0805 09/25/19 1205  GLUCAP 412* 537* 394* 284* 233*   Lipid Profile: Recent Labs    09/22/19 2009  CHOL 167  HDL 28*  LDLCALC 110*  TRIG 147  CHOLHDL 6.0   Thyroid Function Tests: No results for input(s): TSH, T4TOTAL, FREET4, T3FREE, THYROIDAB in the last 72 hours. Anemia Panel: Recent Labs    09/24/19 0350 09/25/19 0340  FERRITIN 1,153* 613*   Sepsis Labs: Recent Labs  Lab 09/22/19 1114 09/22/19 2009  PROCALCITON 0.16  --   LATICACIDVEN 2.2* 1.8    Recent Results (from the past 240 hour(s))  SARS CORONAVIRUS 2 (TAT 6-24 HRS)     Status: Abnormal    Collection Time: 09/20/19  3:26 AM  Result Value Ref Range Status   SARS Coronavirus 2 POSITIVE (A) NEGATIVE Final    Comment: RESULT CALLED TO, READ BACK BY AND VERIFIED WITH: EMAILED Aretta Nip 0128 09/21/19 G.MCADOO (NOTE) SARS-CoV-2 target nucleic acids are DETECTED. The SARS-CoV-2 RNA is generally detectable in upper and lower respiratory specimens during the acute phase of infection. Positive results are indicative of the presence of SARS-CoV-2 RNA. Clinical correlation with patient history and other diagnostic information is  necessary to determine patient infection status. Positive results do not rule out bacterial infection or co-infection with other viruses.  The expected result is Negative. Fact Sheet for Patients: SugarRoll.be Fact Sheet for Healthcare Providers: https://www.woods-mathews.com/ This test is not yet approved or cleared by the [place] FDA and  has been authorized for detection and/or diagnosis of SARS-CoV-2 by FDA under an Emergency Use Authorization (EUA). This  EUA will remain  in effect (meaning this test can be used)  for the duration of the COVID-19 declaration under Section 564(b)(1) of the Act, 21 U.S.C. section 360bbb-3(b)(1), unless the authorization is terminated or revoked sooner. Performed at [facility], [place] [address] [place], [place] [address]   Culture, blood (Routine X 2) w Reflex to ID Panel     Status: Abnormal   Collection Time: 09/22/19  9:43 PM   Specimen: BLOOD  Result Value Ref Range Status   Specimen Description BLOOD RIGHT ANTECUBITAL  Final   Special Requests   Final    BOTTLES DRAWN AEROBIC AND ANAEROBIC Blood Culture adequate volume   Culture  Setup Time   Final    GRAM POSITIVE COCCI AEROBIC BOTTLE ONLY CRITICAL RESULT CALLED TO, READ BACK BY AND VERIFIED WITH: PHARMD MASTERS, A [id number] FCP    Culture (A)  Final    STAPHYLOCOCCUS SPECIES (COAGULASE NEGATIVE) THE  SIGNIFICANCE OF ISOLATING THIS ORGANISM FROM A SINGLE VENIPUNCTURE CANNOT BE PREDICTED WITHOUT FURTHER CLINICAL AND CULTURE CORRELATION. SUSCEPTIBILITIES AVAILABLE ONLY ON REQUEST. Performed at [facility], [place] [address], [place], [place] [address]    Report Status 09/25/2019 FINAL  Final         Radiology Studies: No results found.      Scheduled Meds: . amLODipine  10 mg Oral Daily  . aspirin EC  81 mg Oral Daily  . carvedilol  25 mg Oral BID  . dexamethasone (DECADRON) injection  8 mg Intravenous Q24H  . enoxaparin (LOVENOX) injection  40 mg Subcutaneous Q24H  . escitalopram  30 mg Oral Daily  . insulin aspart  0-20 Units Subcutaneous TID WC  . insulin aspart  0-5 Units Subcutaneous QHS  . insulin aspart  15 Units Subcutaneous TID WC  . insulin detemir  25 Units Subcutaneous BID  . mirtazapine  7.5 mg Oral QHS  . pravastatin  10 mg Oral Daily  . pregabalin  75 mg Oral TID  . vitamin C  500 mg Oral Daily  . zinc sulfate  220 mg Oral Daily   Continuous Infusions: . remdesivir 100 mg in NS 100 mL 100 mg (09/25/19 0957)     LOS: 3 days    Time spent: 25 minutes    Barb Merino, MD [facility] Pager [phone]

## 2019-09-26 LAB — COMPREHENSIVE METABOLIC PANEL
ALT: 28 U/L (ref 0–44)
AST: 28 U/L (ref 15–41)
Albumin: 2.8 g/dL — ABNORMAL LOW (ref 3.5–5.0)
Alkaline Phosphatase: 51 U/L (ref 38–126)
Anion gap: 12 (ref 5–15)
BUN: 39 mg/dL — ABNORMAL HIGH (ref 8–23)
CO2: 23 mmol/L (ref 22–32)
Calcium: 9.1 mg/dL (ref 8.9–10.3)
Chloride: 107 mmol/L (ref 98–111)
Creatinine, Ser: 0.92 mg/dL (ref 0.44–1.00)
GFR calc Af Amer: >60 mL/min (ref >60)
GFR calc non Af Amer: >60 mL/min (ref >60)
Glucose, Bld: 224 mg/dL — ABNORMAL HIGH (ref 70–99)
Potassium: 4.2 mmol/L (ref 3.5–5.1)
Sodium: 142 mmol/L (ref 135–145)
Total Bilirubin: 0.8 mg/dL (ref 0.3–1.2)
Total Protein: 6 g/dL — ABNORMAL LOW (ref 6.5–8.1)

## 2019-09-26 LAB — CBC WITH DIFFERENTIAL/PLATELET
Abs Immature Granulocytes: 0.19 10*3/uL — ABNORMAL HIGH (ref 0.00–0.07)
Basophils Absolute: 0 10*3/uL (ref 0.0–0.1)
Basophils Relative: 0 %
Eosinophils Absolute: 0 10*3/uL (ref 0.0–0.5)
Eosinophils Relative: 0 %
HCT: 36.9 % (ref 36.0–46.0)
Hemoglobin: 11.5 g/dL — ABNORMAL LOW (ref 12.0–15.0)
Immature Granulocytes: 2 %
Lymphocytes Relative: 12 %
Lymphs Abs: 1.1 10*3/uL (ref 0.7–4.0)
MCH: 25.2 pg — ABNORMAL LOW (ref 26.0–34.0)
MCHC: 31.2 g/dL (ref 30.0–36.0)
MCV: 80.7 fL (ref 80.0–100.0)
Monocytes Absolute: 0.6 10*3/uL (ref 0.1–1.0)
Monocytes Relative: 6 %
Neutro Abs: 7 10*3/uL (ref 1.7–7.7)
Neutrophils Relative %: 80 %
Platelets: 375 10*3/uL (ref 150–400)
RBC: 4.57 MIL/uL (ref 3.87–5.11)
RDW: 15 % (ref 11.5–15.5)
WBC: 8.8 10*3/uL (ref 4.0–10.5)
nRBC: 0 % (ref 0.0–0.2)

## 2019-09-26 LAB — D-DIMER, QUANTITATIVE: D-Dimer, Quant: 2.44 ug/mL-FEU — ABNORMAL HIGH (ref 0.00–0.50)

## 2019-09-26 LAB — FERRITIN: Ferritin: 454 ng/mL — ABNORMAL HIGH (ref 11–307)

## 2019-09-26 LAB — GLUCOSE, CAPILLARY
Glucose-Capillary: 191 mg/dL — ABNORMAL HIGH (ref 70–99)
Glucose-Capillary: 225 mg/dL — ABNORMAL HIGH (ref 70–99)
Glucose-Capillary: 259 mg/dL — ABNORMAL HIGH (ref 70–99)
Glucose-Capillary: 305 mg/dL — ABNORMAL HIGH (ref 70–99)

## 2019-09-26 LAB — C-REACTIVE PROTEIN: CRP: 1.5 mg/dL — ABNORMAL HIGH (ref <1.0)

## 2019-09-26 LAB — MAGNESIUM: Magnesium: 2 mg/dL (ref 1.7–2.4)

## 2019-09-26 MED ORDER — DEXAMETHASONE SODIUM PHOSPHATE 10 MG/ML IJ SOLN
6.0000 mg | INTRAMUSCULAR | Status: DC
  Administered 2019-09-26 – 2019-09-27 (×2): 6 mg via INTRAVENOUS
  Filled 2019-09-26 (×2): qty 1

## 2019-09-26 MED ORDER — INSULIN ASPART 100 UNIT/ML ~~LOC~~ SOLN
18.0000 [IU] | Freq: Three times a day (TID) | SUBCUTANEOUS | Status: DC
  Administered 2019-09-27 (×3): 18 [IU] via SUBCUTANEOUS

## 2019-09-26 NOTE — Progress Notes (Signed)
Patient deferred family update. 

## 2019-09-26 NOTE — Progress Notes (Signed)
Occupational Therapy Evaluation Patient Details Name: Deborah Weeks MRN: [id number] DOB: 1945/10/20 Today's Date: 09/26/2019    History of Present Illness 73 year old female with history of hypertension, diabetes on Metformin at home, hyperlipidemia and lupus who presented to emergency department due to worsening cough and shortness of breath for 3 days, 12/10 . Pt d/c home but retirend with poor appetite loose stools productive cough and worsening shortness of breath. amitted with COVID 19 directed therapies.   Clinical Impression   PTA, pt independent with ADL and mobility. Pt states she has been off oxygen for 8-9 months. Able to ambulate to bathroom and complete ADL standing x 10 min with SpO2 desat to 88 on RA using R earlobe probe. 1/4 DOE. Once seated, SpO2 93 RA. Encourage pt to ambulate with staff - may need O2 for ambulation. Do not anticipate need for OT follow up. Recommend a 3in1 to use as a shower chair. Will follow acutely to facilitate safe DC home with family who can provide 24/7 care if needed.     Follow Up Recommendations  No OT follow up;Supervision - Intermittent    Equipment Recommendations  3 in 1 bedside commode    Recommendations for Other Services       Precautions / Restrictions        Mobility Bed Mobility Overal bed mobility: Modified Independent                Transfers Overall transfer level: Needs assistance   Transfers: Sit to/from Stand;Stand Pivot Transfers Sit to Stand: Supervision Stand pivot transfers: Supervision            Balance Overall balance assessment: Mild deficits observed, not formally tested                                         ADL either performed or assessed with clinical judgement   ADL Overall ADL's : Needs assistance/impaired     Grooming: Set up;Standing;Supervision/safety   Upper Body Bathing: Set up;Sitting   Lower Body Bathing: Supervison/ safety;Sit to/from stand    Upper Body Dressing : Set up;Sitting   Lower Body Dressing: Set up;Supervision/safety;Sit to/from stand   Toilet Transfer: Supervision/safety;Ambulation;Comfort height toilet   Toileting- Clothing Manipulation and Hygiene: Modified independent       Functional mobility during ADLs: Supervision/safety General ADL Comments: Desat to 69 during ADL on RA. 1/4 DOE     Vision         Perception     Praxis      Pertinent Vitals/Pain Pain Assessment: No/denies pain     Hand Dominance Right   Extremity/Trunk Assessment Upper Extremity Assessment Upper Extremity Assessment: Generalized weakness   Lower Extremity Assessment Lower Extremity Assessment: Defer to PT evaluation   Cervical / Trunk Assessment Cervical / Trunk Assessment: Normal   Communication Communication Communication: No difficulties   Cognition Arousal/Alertness: Awake/alert Behavior During Therapy: WFL for tasks assessed/performed Overall Cognitive Status: Within Functional Limits for tasks assessed                                     General Comments       Exercises     Shoulder Instructions      Home Living Family/patient expects to be discharged to:: Private residence Living Arrangements: Children Available Help at Discharge:  Family Type of Home: House Home Access: Ramped entrance     Home Layout: One level     Bathroom Shower/Tub: Occupational psychologist: Handicapped height Bathroom Accessibility: Yes How Accessible: Accessible via walker Home Equipment: [place] - single point          Prior Functioning/Environment Level of Independence: Independent        Comments: Occasionally used O2 when she "got sick with Covid". Had not worn oxygen for 8-9 months        OT Problem List: Decreased activity tolerance;Decreased knowledge of use of DME or AE;Cardiopulmonary status limiting activity      OT Treatment/Interventions: Self-care/ADL  training;Therapeutic exercise;Neuromuscular education;Energy conservation;DME and/or AE instruction;Therapeutic activities;Patient/family education    OT Goals(Current goals can be found in the care plan section) Acute Rehab OT Goals Patient Stated Goal: go home OT Goal Formulation: With patient Time For Goal Achievement: 10/10/19 Potential to Achieve Goals: Good  OT Frequency: Min 2X/week   Barriers to D/C:            Co-evaluation              AM-PAC OT "6 Clicks" Daily Activity     Outcome Measure Help from another person eating meals?: None Help from another person taking care of personal grooming?: A Little Help from another person toileting, which includes using toliet, bedpan, or urinal?: A Little Help from another person bathing (including washing, rinsing, drying)?: A Little Help from another person to put on and taking off regular upper body clothing?: A Little Help from another person to put on and taking off regular lower body clothing?: A Little 6 Click Score: 19   End of Session Nurse Communication: Mobility status  Activity Tolerance: Patient tolerated treatment well Patient left: in chair;with call bell/phone within reach  OT Visit Diagnosis: Muscle weakness (generalized) (M62.81);Unsteadiness on feet (R26.81)                Time: [id number] OT Time Calculation (min): 34 min Charges:  OT General Charges $OT Visit: 1 Visit OT Evaluation $OT Eval Moderate Complexity: 1 Mod OT Treatments $Self Care/Home Management : 8-22 mins  Maurie Boettcher, OT/L   Acute OT Clinical Specialist Acute Rehabilitation Services Pager [phone] Office [phone]   [facility] 09/26/2019, 5:24 PM

## 2019-09-26 NOTE — Progress Notes (Signed)
Deborah Weeks  [id number] DOB: [birth year] DOA: 09/22/2019 PCP: Buzzy Han, MD    Brief Narrative:  73 year old female with history of hypertension, diabetes on Metformin at home, hyperlipidemia and lupus who presented to emergency department due to worsening cough and shortness of breath for 3 days.  Patient was diagnosed with COVID-19, 2 days ago when she came to the emergency room.  Was discharged on oral Levaquin.  Patient continued to have poor appetite loose stools productive cough and worsening shortness of breath so reported back to the ER. In the emergency room, hemodynamically stable.  Temperature 101.4 Tachycardia and tachypnea.  Chest x-ray shows worsening multifocal pneumonia.  Admitted to the hospital with COVID-19 directed therapies.  Significant Events:   COVID-19 specific Treatment:   Subjective: Some improvement today.  Feels she is less short of breath.  Appetite improving.  Does still become dyspneic with exertion.  Denies abdominal pain.  Assessment & Plan:  COVID Pneumonia - acute hypoxic respiratory failure Continue remdesivir and steroids -making progress with weaning of oxygen -inflammatory markers trending in a favorable direction -mobilize  Recent Labs  Lab 09/22/19 1114 09/24/19 0350 09/25/19 0340 09/26/19 0035  DDIMER 2.29* 2.10* 2.10* 2.44*  FERRITIN 1,483* 1,153* 613* 454*  CRP 13.3* 6.7* 2.6* 1.5*  ALT 30 30 26 28   PROCALCITON 0.16  --   --   --     DM2 - uncontrolled w/ hyperglycemia  CBG not yet at goal in setting of ongoing high-dose steroid -adjust treatment further and follow  Hypertension Blood pressure presently well controlled  Depression Continue home doses of Remeron and Lexapro   Neuropathy Continue Lyrica  Stage III chronic kidney disease Creatinine stable and appears to be at her baseline   DVT prophylaxis: lovenox  Code Status: FULL CODE Family Communication:  Disposition Plan: Telemetry  bed  Consultants:  none  Antimicrobials:  None    Objective: Blood pressure 107/60, pulse 64, temperature 98.8 F (37.1 C), temperature source Oral, resp. rate 16, height 5\' 7"  (1.702 m), weight 97 kg, SpO2 91 %.  Intake/Output Summary (Last 24 hours) at 09/26/2019 1215 Last data filed at 09/26/2019 [id number] Gross per 24 hour  Intake 700 ml  Output 650 ml  Net 50 ml   Filed Weights   09/22/19 1131 09/22/19 2244  Weight: 94 kg 97 kg    Examination: General: No acute respiratory distress Lungs: Mild diffuse crackles with no wheezing Cardiovascular: Regular rate and rhythm without murmur gallop or rub normal S1 and S2 Abdomen: Nontender, nondistended, soft, bowel sounds positive, no rebound, no ascites, no appreciable mass Extremities: No significant cyanosis, clubbing, or edema bilateral lower extremities  CBC: Recent Labs  Lab 09/24/19 0350 09/25/19 0340 09/26/19 0035  WBC 10.1 9.5 8.8  NEUTROABS 7.8* 7.7 7.0  HGB 12.3 11.7* 11.5*  HCT 40.5 38.3 36.9  MCV 82.5 81.5 80.7  PLT 380 374 [id number]   Basic Metabolic Panel: Recent Labs  Lab 09/24/19 0350 09/25/19 0340 09/26/19 0035  NA 143 143 142  K 4.7 4.7 4.2  CL 108 107 107  CO2 22 25 23   GLUCOSE 322* 283* 224*  BUN 43* 42* 39*  CREATININE 1.11* 1.01* 0.92  CALCIUM 9.7 9.5 9.1  MG 2.2 2.2 2.0   GFR: Estimated Creatinine Clearance: 65.2 mL/min (by C-G formula based on SCr of 0.92 mg/dL).  Liver Function Tests: Recent Labs  Lab 09/22/19 1114 09/24/19 0350 09/25/19 0340 09/26/19 0035  AST 49* 42* 31 28  ALT 30  30 26 28   ALKPHOS 43 52 55 51  BILITOT 0.7 0.6 0.9 0.8  PROT 7.2 7.1 6.6 6.0*  ALBUMIN 3.1* 3.2* 2.9* 2.8*   HbA1C: Hgb A1c MFr Bld  Date/Time Value Ref Range Status  09/22/2019 08:09 PM 7.5 (H) 4.8 - 5.6 % Final    Comment:    (NOTE) Pre diabetes:          5.7%-6.4% Diabetes:              >6.4% Glycemic control for   <7.0% adults with diabetes   10/16/2018 02:20 AM 6.1 (H) 4.8 - 5.6 %  Final    Comment:    (NOTE) Pre diabetes:          5.7%-6.4% Diabetes:              >6.4% Glycemic control for   <7.0% adults with diabetes     CBG: Recent Labs  Lab 09/25/19 1205 09/25/19 1637 09/25/19 2005 09/26/19 0749 09/26/19 1140  GLUCAP 233* 297* 372* 191* 225*    Recent Results (from the past 240 hour(s))  SARS CORONAVIRUS 2 (TAT 6-24 HRS)     Status: Abnormal   Collection Time: 09/20/19  3:26 AM  Result Value Ref Range Status   SARS Coronavirus 2 POSITIVE (A) NEGATIVE Final    Comment: RESULT CALLED TO, READ BACK BY AND VERIFIED WITH: EMAILED Aretta Nip 0128 09/21/19 G.MCADOO (NOTE) SARS-CoV-2 target nucleic acids are DETECTED. The SARS-CoV-2 RNA is generally detectable in upper and lower respiratory specimens during the acute phase of infection. Positive results are indicative of the presence of SARS-CoV-2 RNA. Clinical correlation with patient history and other diagnostic information is  necessary to determine patient infection status. Positive results do not rule out bacterial infection or co-infection with other viruses.  The expected result is Negative. Fact Sheet for Patients: SugarRoll.be Fact Sheet for Healthcare Providers: https://www.woods-mathews.com/ This test is not yet approved or cleared by the [place] FDA and  has been authorized for detection and/or diagnosis of SARS-CoV-2 by FDA under an Emergency Use Authorization (EUA). This EUA will remain  in effect (meaning this test can be used)  for the duration of the COVID-19 declaration under Section 564(b)(1) of the Act, 21 U.S.C. section 360bbb-3(b)(1), unless the authorization is terminated or revoked sooner. Performed at [facility], [place] [address], [place], [place] [address]   Culture, blood (Routine X 2) w Reflex to ID Panel     Status: Abnormal   Collection Time: 09/22/19  9:43 PM   Specimen: BLOOD  Result Value Ref Range Status    Specimen Description BLOOD RIGHT ANTECUBITAL  Final   Special Requests   Final    BOTTLES DRAWN AEROBIC AND ANAEROBIC Blood Culture adequate volume   Culture  Setup Time   Final    GRAM POSITIVE COCCI AEROBIC BOTTLE ONLY CRITICAL RESULT CALLED TO, READ BACK BY AND VERIFIED WITH: PHARMD MASTERS, A [id number] FCP    Culture (A)  Final    STAPHYLOCOCCUS SPECIES (COAGULASE NEGATIVE) THE SIGNIFICANCE OF ISOLATING THIS ORGANISM FROM A SINGLE VENIPUNCTURE CANNOT BE PREDICTED WITHOUT FURTHER CLINICAL AND CULTURE CORRELATION. SUSCEPTIBILITIES AVAILABLE ONLY ON REQUEST. Performed at [facility], [place] [address] [place], [place] [address]    Report Status 09/25/2019 FINAL  Final     Scheduled Meds: . amLODipine  10 mg Oral Daily  . aspirin EC  81 mg Oral Daily  . carvedilol  25 mg Oral BID  .  dexamethasone (DECADRON) injection  8 mg Intravenous Q24H  . enoxaparin (LOVENOX) injection  40 mg Subcutaneous Q24H  . escitalopram  30 mg Oral Daily  . insulin aspart  0-20 Units Subcutaneous TID WC  . insulin aspart  0-5 Units Subcutaneous QHS  . insulin aspart  15 Units Subcutaneous TID WC  . insulin detemir  25 Units Subcutaneous BID  . mirtazapine  7.5 mg Oral QHS  . pravastatin  10 mg Oral Daily  . pregabalin  75 mg Oral TID  . senna-docusate  1 tablet Oral BID  . vitamin C  500 mg Oral Daily  . zinc sulfate  220 mg Oral Daily     LOS: 4 days   Cherene Altes, MD [facility] Office  [phone] Pager - Text Page per Amion  If 7PM-7AM, please contact night-coverage per Amion 09/26/2019, 12:15 PM

## 2019-09-27 LAB — CBC WITH DIFFERENTIAL/PLATELET
Abs Immature Granulocytes: 0.3 10*3/uL — ABNORMAL HIGH (ref 0.00–0.07)
Basophils Absolute: 0 10*3/uL (ref 0.0–0.1)
Basophils Relative: 0 %
Eosinophils Absolute: 0 10*3/uL (ref 0.0–0.5)
Eosinophils Relative: 0 %
HCT: 37.4 % (ref 36.0–46.0)
Hemoglobin: 11.7 g/dL — ABNORMAL LOW (ref 12.0–15.0)
Immature Granulocytes: 3 %
Lymphocytes Relative: 13 %
Lymphs Abs: 1.1 10*3/uL (ref 0.7–4.0)
MCH: 25.3 pg — ABNORMAL LOW (ref 26.0–34.0)
MCHC: 31.3 g/dL (ref 30.0–36.0)
MCV: 81 fL (ref 80.0–100.0)
Monocytes Absolute: 0.6 10*3/uL (ref 0.1–1.0)
Monocytes Relative: 6 %
Neutro Abs: 6.9 10*3/uL (ref 1.7–7.7)
Neutrophils Relative %: 78 %
Platelets: 425 10*3/uL — ABNORMAL HIGH (ref 150–400)
RBC: 4.62 MIL/uL (ref 3.87–5.11)
RDW: 14.9 % (ref 11.5–15.5)
WBC: 8.9 10*3/uL (ref 4.0–10.5)
nRBC: 0.5 % — ABNORMAL HIGH (ref 0.0–0.2)

## 2019-09-27 LAB — COMPREHENSIVE METABOLIC PANEL
ALT: 29 U/L (ref 0–44)
AST: 36 U/L (ref 15–41)
Albumin: 2.8 g/dL — ABNORMAL LOW (ref 3.5–5.0)
Alkaline Phosphatase: 55 U/L (ref 38–126)
Anion gap: 8 (ref 5–15)
BUN: 42 mg/dL — ABNORMAL HIGH (ref 8–23)
CO2: 29 mmol/L (ref 22–32)
Calcium: 9.1 mg/dL (ref 8.9–10.3)
Chloride: 106 mmol/L (ref 98–111)
Creatinine, Ser: 0.93 mg/dL (ref 0.44–1.00)
GFR calc Af Amer: >60 mL/min (ref >60)
GFR calc non Af Amer: >60 mL/min (ref >60)
Glucose, Bld: 166 mg/dL — ABNORMAL HIGH (ref 70–99)
Potassium: 4.6 mmol/L (ref 3.5–5.1)
Sodium: 143 mmol/L (ref 135–145)
Total Bilirubin: 0.7 mg/dL (ref 0.3–1.2)
Total Protein: 6.2 g/dL — ABNORMAL LOW (ref 6.5–8.1)

## 2019-09-27 LAB — GLUCOSE, CAPILLARY
Glucose-Capillary: 223 mg/dL — ABNORMAL HIGH (ref 70–99)
Glucose-Capillary: 374 mg/dL — ABNORMAL HIGH (ref 70–99)
Glucose-Capillary: 242 mg/dL — ABNORMAL HIGH (ref 70–99)
Glucose-Capillary: 200 mg/dL — ABNORMAL HIGH (ref 70–99)

## 2019-09-27 LAB — FERRITIN: Ferritin: 401 ng/mL — ABNORMAL HIGH (ref 11–307)

## 2019-09-27 LAB — C-REACTIVE PROTEIN: CRP: 0.9 mg/dL (ref <1.0)

## 2019-09-27 LAB — D-DIMER, QUANTITATIVE: D-Dimer, Quant: 1.9 ug/mL-FEU — ABNORMAL HIGH (ref 0.00–0.50)

## 2019-09-27 LAB — MAGNESIUM: Magnesium: 2.3 mg/dL (ref 1.7–2.4)

## 2019-09-27 MED ORDER — INSULIN ASPART 100 UNIT/ML ~~LOC~~ SOLN
20.0000 [IU] | Freq: Three times a day (TID) | SUBCUTANEOUS | Status: DC
  Administered 2019-09-28 – 2019-09-29 (×3): 20 [IU] via SUBCUTANEOUS

## 2019-09-27 MED ORDER — INSULIN DETEMIR 100 UNIT/ML ~~LOC~~ SOLN
28.0000 [IU] | Freq: Two times a day (BID) | SUBCUTANEOUS | Status: DC
  Administered 2019-09-27 – 2019-09-29 (×4): 28 [IU] via SUBCUTANEOUS
  Filled 2019-09-27 (×6): qty 0.28

## 2019-09-27 MED ORDER — LINAGLIPTIN 5 MG PO TABS
5.0000 mg | ORAL_TABLET | Freq: Every day | ORAL | Status: DC
  Administered 2019-09-27 – 2019-09-29 (×3): 5 mg via ORAL
  Filled 2019-09-27 (×3): qty 1

## 2019-09-27 NOTE — Plan of Care (Signed)

## 2019-09-27 NOTE — Progress Notes (Signed)
Pt given flutter valve and IS. Pt performed both effectively. Educated pt on uses and purpose. Pt verbalized understanding.

## 2019-09-27 NOTE — Progress Notes (Signed)
Pt transferred to rm 316 via w/c transport. Belongings taken w/pt. Telephone report called to Zull,RN. Pt talking w/fammily member on phone and reported room change to family. Nothing further to note.

## 2019-09-27 NOTE — Progress Notes (Signed)
Pt oriented to unit and room. Pt sitting on side of bed eating dinner. Pt is on RA at this time and sats 92-93%. NAD. No c/o pain. Pt is a stand-by assist. VSS. Will give bedside report to PM nurse.

## 2019-09-27 NOTE — Progress Notes (Addendum)
Physical Therapy Treatment Patient Details Name: Deborah Weeks MRN: [id number] DOB: Oct 01, 1946 Today's Date: 09/27/2019    History of Present Illness 73 year old female with history of hypertension, diabetes on Metformin at home, hyperlipidemia and lupus who presented to emergency department due to worsening cough and shortness of breath for 3 days, 12/10 . Pt d/c home but retirend with poor appetite loose stools productive cough and worsening shortness of breath. amitted with COVID 19 directed therapies.    PT Comments    Pt showing progress with mobility, she was able to tolerate increased ambulation distance and also decreased assistance with bed mob and transfers. Bed mob and transfers from bed, recliner and commode all with mod I, ambulated approx 279ft in hall with HHA and min guard assist, used 3L/min via [place] and noted min desat briefly at 88% with cues immediately able to recover to 90s.     Follow Up Recommendations  No PT follow up     Equipment Recommendations  None recommended by PT    Recommendations for Other Services       Precautions / Restrictions Precautions Precautions: Fall Restrictions Weight Bearing Restrictions: No    Mobility  Bed Mobility Overal bed mobility: Modified Independent Bed Mobility: Rolling;Sidelying to Sit Rolling: Modified independent (Device/Increase time) Sidelying to sit: Modified independent (Device/Increase time)          Transfers Overall transfer level: Needs assistance Equipment used: None Transfers: Sit to/from [facility] Sit to Stand: Supervision Stand pivot transfers: Supervision          Ambulation/Gait Ambulation/Gait assistance: Min guard Gait Distance (Feet): 200 Feet Assistive device: 1 person hand held assist Gait Pattern/deviations: Step-through pattern Gait velocity: very slow   General Gait Details: on 3L/min [place] min desat 88% monitor alarming stating detected v tach, max HR  112bpm   Stairs             Wheelchair Mobility    Modified Rankin (Stroke Patients Only)       Balance Overall balance assessment: Mild deficits observed, not formally tested                                          Cognition Arousal/Alertness: Awake/alert Behavior During Therapy: WFL for tasks assessed/performed Overall Cognitive Status: Within Functional Limits for tasks assessed                                        Exercises      General Comments        Pertinent Vitals/Pain      Home Living                      Prior Function            PT Goals (current goals can now be found in the care plan section) Acute Rehab PT Goals Patient Stated Goal: go home PT Goal Formulation: With patient Time For Goal Achievement: 10/09/19 Potential to Achieve Goals: Good Progress towards PT goals: Progressing toward goals    Frequency    Min 3X/week      PT Plan Discharge plan needs to be updated    Co-evaluation              AM-PAC PT "6 Clicks" Mobility  Outcome Measure  Help needed turning from your back to your side while in a flat bed without using bedrails?: None Help needed moving from lying on your back to sitting on the side of a flat bed without using bedrails?: None Help needed moving to and from a bed to a chair (including a wheelchair)?: None Help needed standing up from a chair using your arms (e.g., wheelchair or bedside chair)?: None Help needed to walk in hospital room?: A Little Help needed climbing 3-5 steps with a railing? : A Lot 6 Click Score: 21    End of Session Equipment Utilized During Treatment: Oxygen Activity Tolerance: Patient limited by fatigue Patient left: in chair;with call bell/phone within reach Nurse Communication: Mobility status PT Visit Diagnosis: Unsteadiness on feet (R26.81);Other abnormalities of gait and mobility (R26.89);Muscle weakness (generalized)  (M62.81)     Time: [id number] PT Time Calculation (min) (ACUTE ONLY): 29 min  Charges:  $Gait Training: 8-22 mins $Therapeutic Activity: 8-22 mins                     Horald Chestnut, PT     Delford Field 09/27/2019, 1:34 PM

## 2019-09-27 NOTE — Progress Notes (Signed)
Deborah Weeks  [id number] DOB: 1946/10/04 DOA: 09/22/2019 PCP: Buzzy Han, MD    Brief Narrative:  73 year old female with history of hypertension, diabetes on Metformin at home, hyperlipidemia and lupus who presented to emergency department due to worsening cough and shortness of breath for 3 days.  Patient was diagnosed with COVID-19, 2 days ago when she came to the emergency room.  Was discharged on oral Levaquin.  Patient continued to have poor appetite loose stools productive cough and worsening shortness of breath so reported back to the ER. In the emergency room, hemodynamically stable.  Temperature 101.4 Tachycardia and tachypnea.  Chest x-ray shows worsening multifocal pneumonia.  Admitted to the hospital with COVID-19 directed therapies.  Significant Events:   COVID-19 specific Treatment: Remdesivir 12/12 > 12/16 Solu-Medrol 12/12 > 12/14 Decadron 12/14 >  Subjective: On 2-3 LPM [place] support. CBG remains poorly controlled.  The patient reports that she feels weak in general but denies present shortness of breath.  She reports that her appetite is improving.  She denies chest pain.  Assessment & Plan:  COVID Pneumonia - acute hypoxic respiratory failure Has completed a course of remdesivir -continue steroids to complete a 10-day course - making progress with weaning of oxygen - inflammatory markers trending in a favorable direction - mobilize  Recent Labs  Lab 09/22/19 1114 09/24/19 0350 09/25/19 0340 09/26/19 0035 09/27/19 0040  DDIMER 2.29* 2.10* 2.10* 2.44* 1.90*  FERRITIN 1,483* 1,153* 613* 454* 401*  CRP 13.3* 6.7* 2.6* 1.5* 0.9  ALT 30 30 26 28 29   PROCALCITON 0.16  --   --   --   --     DM2 - uncontrolled w/ hyperglycemia  CBG not yet at goal in setting of ongoing high-dose steroid -adjust treatment further again today and follow  Hypertension Blood pressure presently well controlled -monitor closely with episodes of modest  hypotension  Depression Continue home doses of Remeron and Lexapro   Neuropathy Continue Lyrica  Stage III chronic kidney disease Creatinine stable and appears to be at her baseline   DVT prophylaxis: lovenox  Code Status: FULL CODE Family Communication:  Disposition Plan: MedSurg bed -ambulate -wean oxygen -potential discharge approximately 48 hours  Consultants:  none  Antimicrobials:  None    Objective: Blood pressure (!) 90/55, pulse (!) 54, temperature 97.7 F (36.5 C), temperature source Oral, resp. rate 17, height 5\' 7"  (1.702 m), weight 97 kg, SpO2 94 %.  Intake/Output Summary (Last 24 hours) at 09/27/2019 1200 Last data filed at 09/27/2019 1100 Gross per 24 hour  Intake 600 ml  Output 250 ml  Net 350 ml   Filed Weights   09/22/19 1131 09/22/19 2244  Weight: 94 kg 97 kg    Examination: General: No acute respiratory distress at rest Lungs: Mild diffuse crackles with no wheezing -without change Cardiovascular: RRR without murmur or rub Abdomen: NT/ND, soft, BS positive Extremities: No significant edema bilateral lower extremities  CBC: Recent Labs  Lab 09/25/19 0340 09/26/19 0035 09/27/19 0040  WBC 9.5 8.8 8.9  NEUTROABS 7.7 7.0 6.9  HGB 11.7* 11.5* 11.7*  HCT 38.3 36.9 37.4  MCV 81.5 80.7 81.0  PLT 374 375 [id number]*   Basic Metabolic Panel: Recent Labs  Lab 09/25/19 0340 09/26/19 0035 09/27/19 0040  NA 143 142 143  K 4.7 4.2 4.6  CL 107 107 106  CO2 25 23 29   GLUCOSE 283* 224* 166*  BUN 42* 39* 42*  CREATININE 1.01* 0.92 0.93  CALCIUM 9.5 9.1 9.1  MG 2.2 2.0 2.3   GFR: Estimated Creatinine Clearance: 64.5 mL/min (by C-G formula based on SCr of 0.93 mg/dL).  Liver Function Tests: Recent Labs  Lab 09/24/19 0350 09/25/19 0340 09/26/19 0035 09/27/19 0040  AST 42* 31 28 36  ALT 30 26 28 29   ALKPHOS 52 55 51 55  BILITOT 0.6 0.9 0.8 0.7  PROT 7.1 6.6 6.0* 6.2*  ALBUMIN 3.2* 2.9* 2.8* 2.8*   HbA1C: Hgb A1c MFr Bld  Date/Time  Value Ref Range Status  09/22/2019 08:09 PM 7.5 (H) 4.8 - 5.6 % Final    Comment:    (NOTE) Pre diabetes:          5.7%-6.4% Diabetes:              >6.4% Glycemic control for   <7.0% adults with diabetes   10/16/2018 02:20 AM 6.1 (H) 4.8 - 5.6 % Final    Comment:    (NOTE) Pre diabetes:          5.7%-6.4% Diabetes:              >6.4% Glycemic control for   <7.0% adults with diabetes     CBG: Recent Labs  Lab 09/26/19 1140 09/26/19 1619 09/26/19 1909 09/27/19 0747 09/27/19 1125  GLUCAP 225* 259* 305* 223* 374*    Recent Results (from the past 240 hour(s))  SARS CORONAVIRUS 2 (TAT 6-24 HRS)     Status: Abnormal   Collection Time: 09/20/19  3:26 AM  Result Value Ref Range Status   SARS Coronavirus 2 POSITIVE (A) NEGATIVE Final    Comment: RESULT CALLED TO, READ BACK BY AND VERIFIED WITH: EMAILED Aretta Nip 0128 09/21/19 G.MCADOO (NOTE) SARS-CoV-2 target nucleic acids are DETECTED. The SARS-CoV-2 RNA is generally detectable in upper and lower respiratory specimens during the acute phase of infection. Positive results are indicative of the presence of SARS-CoV-2 RNA. Clinical correlation with patient history and other diagnostic information is  necessary to determine patient infection status. Positive results do not rule out bacterial infection or co-infection with other viruses.  The expected result is Negative. Fact Sheet for Patients: SugarRoll.be Fact Sheet for Healthcare Providers: https://www.woods-mathews.com/ This test is not yet approved or cleared by the [place] FDA and  has been authorized for detection and/or diagnosis of SARS-CoV-2 by FDA under an Emergency Use Authorization (EUA). This EUA will remain  in effect (meaning this test can be used)  for the duration of the COVID-19 declaration under Section 564(b)(1) of the Act, 21 U.S.C. section 360bbb-3(b)(1), unless the authorization is terminated or revoked  sooner. Performed at [facility], [place] [address], [place], [place] [address]   Culture, blood (Routine X 2) w Reflex to ID Panel     Status: Abnormal   Collection Time: 09/22/19  9:43 PM   Specimen: BLOOD  Result Value Ref Range Status   Specimen Description BLOOD RIGHT ANTECUBITAL  Final   Special Requests   Final    BOTTLES DRAWN AEROBIC AND ANAEROBIC Blood Culture adequate volume   Culture  Setup Time   Final    GRAM POSITIVE COCCI AEROBIC BOTTLE ONLY CRITICAL RESULT CALLED TO, READ BACK BY AND VERIFIED WITH: PHARMD MASTERS, A [id number] FCP    Culture (A)  Final    STAPHYLOCOCCUS SPECIES (COAGULASE NEGATIVE) THE SIGNIFICANCE OF ISOLATING THIS ORGANISM FROM A SINGLE VENIPUNCTURE CANNOT BE PREDICTED WITHOUT FURTHER CLINICAL AND CULTURE CORRELATION. SUSCEPTIBILITIES AVAILABLE ONLY ON REQUEST. Performed at [facility], [place]  [address], [place], [place] [address]    Report Status 09/25/2019 FINAL  Final     Scheduled Meds: . amLODipine  10 mg Oral Daily  . aspirin EC  81 mg Oral Daily  . carvedilol  25 mg Oral BID  . dexamethasone (DECADRON) injection  6 mg Intravenous Q24H  . enoxaparin (LOVENOX) injection  40 mg Subcutaneous Q24H  . escitalopram  30 mg Oral Daily  . insulin aspart  0-20 Units Subcutaneous TID WC  . insulin aspart  0-5 Units Subcutaneous QHS  . insulin aspart  18 Units Subcutaneous TID WC  . insulin detemir  25 Units Subcutaneous BID  . mirtazapine  7.5 mg Oral QHS  . pravastatin  10 mg Oral Daily  . pregabalin  75 mg Oral TID  . senna-docusate  1 tablet Oral BID  . vitamin C  500 mg Oral Daily  . zinc sulfate  220 mg Oral Daily     LOS: 5 days   Cherene Altes, MD [facility] Office  [phone] Pager - Text Page per Amion  If 7PM-7AM, please contact night-coverage per Amion 09/27/2019, 12:00 PM

## 2019-09-28 LAB — GLUCOSE, CAPILLARY
Glucose-Capillary: 161 mg/dL — ABNORMAL HIGH (ref 70–99)
Glucose-Capillary: 294 mg/dL — ABNORMAL HIGH (ref 70–99)
Glucose-Capillary: 365 mg/dL — ABNORMAL HIGH (ref 70–99)
Glucose-Capillary: 159 mg/dL — ABNORMAL HIGH (ref 70–99)

## 2019-09-28 MED ORDER — ALUM & MAG HYDROXIDE-SIMETH 200-200-20 MG/5ML PO SUSP
15.0000 mL | ORAL | Status: DC | PRN
  Administered 2019-09-28 – 2019-09-29 (×2): 30 mL via ORAL
  Filled 2019-09-28 (×3): qty 30

## 2019-09-28 NOTE — Progress Notes (Signed)
Occupational Therapy Treatment Patient Details Name: Deborah Weeks MRN: [id number] DOB: 24-Aug-1946 Today's Date: 09/28/2019    History of present illness 73 year old female with history of hypertension, diabetes on Metformin at home, hyperlipidemia and lupus who presented to emergency department due to worsening cough and shortness of breath for 3 days, 12/10 . Pt d/c home but retirend with poor appetite loose stools productive cough and worsening shortness of breath. amitted with COVID 19 directed therapies.   OT comments  Pt making progress toward goals. Desats to 87 during ADL and when ambulating. States she feels "more winded" without oxygen. Sats maintain in the 90s with 2L. Educating pt on energy conservation, activity modification and reducing risk of falls. Will continue ot follow.   Follow Up Recommendations  No OT follow up;Supervision - Intermittent    Equipment Recommendations  3 in 1 bedside commode    Recommendations for Other Services      Precautions / Restrictions Precautions Precautions: Fall Precaution Comments: monitor O2 Restrictions Weight Bearing Restrictions: No       Mobility Bed Mobility Overal bed mobility: Modified Independent                Transfers Overall transfer level: Needs assistance Equipment used: None Transfers: Sit to/from Stand;Stand Pivot Transfers Sit to Stand: Supervision Stand pivot transfers: Supervision            Balance Overall balance assessment: Mild deficits observed, not formally tested                                         ADL either performed or assessed with clinical judgement   ADL Overall ADL's : Needs assistance/impaired                                     Functional mobility during ADLs: Supervision/safety General ADL Comments: Desat to 87 during ADL on RA. 1/4 DOE. Educated pt on need to complete ADL from seated position to conserve energy       Vision       Perception     Praxis      Cognition Arousal/Alertness: Awake/alert Behavior During Therapy: WFL for tasks assessed/performed Overall Cognitive Status: Within Functional Limits for tasks assessed                                          Exercises     Shoulder Instructions       General Comments Ambulated @ 100 ft with desat to 87    Pertinent Vitals/ Pain          Home Living                                          Prior Functioning/Environment              Frequency  Min 2X/week        Progress Toward Goals  OT Goals(current goals can now be found in the care plan section)  Progress towards OT goals: Progressing toward goals  Acute Rehab OT Goals Patient Stated Goal: go home OT Goal Formulation: With  patient Time For Goal Achievement: 10/10/19 Potential to Achieve Goals: Good ADL Goals Pt Will Perform Lower Body Bathing: with modified independence;sit to/from stand Pt Will Perform Lower Body Dressing: with modified independence;sit to/from stand Pt Will Transfer to Toilet: with modified independence;ambulating Pt/caregiver will Perform Home Exercise Program: Increased strength;With theraband;With written HEP provided;Independently Additional ADL Goal #1: Pt will independently verbalize 3 energy conservation strategies for ADL  Plan Discharge plan remains appropriate    Co-evaluation                 AM-PAC OT "6 Clicks" Daily Activity     Outcome Measure   Help from another person eating meals?: None Help from another person taking care of personal grooming?: A Little Help from another person toileting, which includes using toliet, bedpan, or urinal?: A Little Help from another person bathing (including washing, rinsing, drying)?: A Little Help from another person to put on and taking off regular upper body clothing?: A Little Help from another person to put on and taking off regular  lower body clothing?: A Little 6 Click Score: 19    End of Session Equipment Utilized During Treatment: Oxygen(2L)  OT Visit Diagnosis: Muscle weakness (generalized) (M62.81);Unsteadiness on feet (R26.81)   Activity Tolerance Patient tolerated treatment well   Patient Left in chair;with call bell/phone within reach   Nurse Communication Mobility status        Time: 1020-1053 OT Time Calculation (min): 33 min  Charges: OT General Charges $OT Visit: 1 Visit OT Treatments $Self Care/Home Management : 23-37 mins  Maurie Boettcher, OT/L   Acute OT Clinical Specialist [place] Pager [phone] Office [phone]    [facility] 09/28/2019, 1:13 PM

## 2019-09-28 NOTE — Progress Notes (Signed)
Called pts daughter Lattie Haw, to give update. Daughter has some reservations about pts return home Sat. Daughter requested call from MD prior to d/c/ Will advise MD.

## 2019-09-28 NOTE — Plan of Care (Signed)

## 2019-09-28 NOTE — Progress Notes (Signed)
Deborah Weeks  [id number] DOB: 1945/11/10 DOA: 09/22/2019 PCP: Buzzy Han, MD    Brief Narrative:  73 year old female with history of hypertension, diabetes on metformin, hyperlipidemia and lupus who presented to emergency department due to worsening cough and shortness of breath for 3 days.  Patient was diagnosed with COVID-19, 2 days ago when she came to the emergency room.  Was discharged on oral Levaquin.  Patient continued to have poor appetite loose stools productive cough and worsening shortness of breath so reported back to the ER. In the emergency room, hemodynamically stable.  Temperature 101.4 Tachycardia and tachypnea.  Chest x-ray shows worsening multifocal pneumonia.  Admitted to the hospital with COVID-19 directed therapies.  Significant Events: 12/10 +Covid test in ED 12/12 admit via  -transfer to the [facility]  COVID-19 specific Treatment: Remdesivir 12/12 > 12/16 Solu-Medrol 12/12 > 12/14 Decadron 12/14 > 12/21  Subjective: Has been weaned to room air with oxygen saturations in the low 90s.  PT reports patient is safe for discharge home when medically ready.  Resting comfortably in a bedside chair with no new complaints.  States that she feels she is slowly getting stronger.  Assessment & Plan:  COVID Pneumonia - acute hypoxic respiratory failure Has completed a course of remdesivir -continue steroids to complete a 10-day course - making progress with weaning of oxygen - inflammatory markers trending in a favorable direction -continue to mobilize  Recent Labs  Lab 09/22/19 1114 09/24/19 0350 09/25/19 0340 09/26/19 0035 09/27/19 0040  DDIMER 2.29* 2.10* 2.10* 2.44* 1.90*  FERRITIN 1,483* 1,153* 613* 454* 401*  CRP 13.3* 6.7* 2.6* 1.5* 0.9  ALT 30 30 26 28 29   PROCALCITON 0.16  --   --   --   --     DM2 - uncontrolled w/ hyperglycemia  CBG remains quite variable -adjust treatment again -discontinue steroids as soon as possible   Hypertension Blood pressure reasonably controlled  Depression Continue home doses of Remeron and Lexapro   Neuropathy Continue Lyrica  Stage III chronic kidney disease Creatinine stable and appears to be at her baseline   DVT prophylaxis: lovenox  Code Status: FULL CODE Family Communication:  Disposition Plan: MedSurg bed -ambulate -wean oxygen -potential discharge 12/19  Consultants:  none  Antimicrobials:  None    Objective: Blood pressure (!) 145/66, pulse (!) 57, temperature 98.1 F (36.7 C), temperature source Oral, resp. rate 20, height 5\' 7"  (1.702 m), weight 97 kg, SpO2 91 %.  Intake/Output Summary (Last 24 hours) at 09/28/2019 0956 Last data filed at 09/27/2019 2000 Gross per 24 hour  Intake 600 ml  Output 0 ml  Net 600 ml   Filed Weights   09/22/19 1131 09/22/19 2244  Weight: 94 kg 97 kg    Examination: General: No acute respiratory distress -alert and pleasant Lungs: Mild diffuse crackles -good air movement -no wheezing Cardiovascular: RRR Abdomen: NT/ND, soft, BS positive Extremities: No significant edema B LE   CBC: Recent Labs  Lab 09/25/19 0340 09/26/19 0035 09/27/19 0040  WBC 9.5 8.8 8.9  NEUTROABS 7.7 7.0 6.9  HGB 11.7* 11.5* 11.7*  HCT 38.3 36.9 37.4  MCV 81.5 80.7 81.0  PLT 374 375 [id number]   Basic Metabolic Panel: Recent Labs  Lab 09/25/19 0340 09/26/19 0035 09/27/19 0040  NA 143 142 143  K 4.7 4.2 4.6  CL 107 107 106  CO2 25 23 29   GLUCOSE 283* 224* 166*  BUN 42* 39* 42*  CREATININE 1.01* 0.92 0.93  CALCIUM  9.5 9.1 9.1  MG 2.2 2.0 2.3   GFR: Estimated Creatinine Clearance: 64.5 mL/min (by C-G formula based on SCr of 0.93 mg/dL).  Liver Function Tests: Recent Labs  Lab 09/24/19 0350 09/25/19 0340 09/26/19 0035 09/27/19 0040  AST 42* 31 28 36  ALT 30 26 28 29   ALKPHOS 52 55 51 55  BILITOT 0.6 0.9 0.8 0.7  PROT 7.1 6.6 6.0* 6.2*  ALBUMIN 3.2* 2.9* 2.8* 2.8*   HbA1C: Hgb A1c MFr Bld  Date/Time Value Ref  Range Status  09/22/2019 08:09 PM 7.5 (H) 4.8 - 5.6 % Final    Comment:    (NOTE) Pre diabetes:          5.7%-6.4% Diabetes:              >6.4% Glycemic control for   <7.0% adults with diabetes   10/16/2018 02:20 AM 6.1 (H) 4.8 - 5.6 % Final    Comment:    (NOTE) Pre diabetes:          5.7%-6.4% Diabetes:              >6.4% Glycemic control for   <7.0% adults with diabetes     CBG: Recent Labs  Lab 09/27/19 0747 09/27/19 1125 09/27/19 1704 09/27/19 2152 09/28/19 0709  GLUCAP 223* 374* 242* 200* 161*    Recent Results (from the past 240 hour(s))  SARS CORONAVIRUS 2 (TAT 6-24 HRS)     Status: Abnormal   Collection Time: 09/20/19  3:26 AM  Result Value Ref Range Status   SARS Coronavirus 2 POSITIVE (A) NEGATIVE Final    Comment: RESULT CALLED TO, READ BACK BY AND VERIFIED WITH: EMAILED Aretta Nip 0128 09/21/19 G.MCADOO (NOTE) SARS-CoV-2 target nucleic acids are DETECTED. The SARS-CoV-2 RNA is generally detectable in upper and lower respiratory specimens during the acute phase of infection. Positive results are indicative of the presence of SARS-CoV-2 RNA. Clinical correlation with patient history and other diagnostic information is  necessary to determine patient infection status. Positive results do not rule out bacterial infection or co-infection with other viruses.  The expected result is Negative. Fact Sheet for Patients: SugarRoll.be Fact Sheet for Healthcare Providers: https://www.woods-mathews.com/ This test is not yet approved or cleared by the [place] FDA and  has been authorized for detection and/or diagnosis of SARS-CoV-2 by FDA under an Emergency Use Authorization (EUA). This EUA will remain  in effect (meaning this test can be used)  for the duration of the COVID-19 declaration under Section 564(b)(1) of the Act, 21 U.S.C. section 360bbb-3(b)(1), unless the authorization is terminated or revoked sooner.  Performed at [facility], [place] [address], [place], [place] [address]   Culture, blood (Routine X 2) w Reflex to ID Panel     Status: Abnormal   Collection Time: 09/22/19  9:43 PM   Specimen: BLOOD  Result Value Ref Range Status   Specimen Description BLOOD RIGHT ANTECUBITAL  Final   Special Requests   Final    BOTTLES DRAWN AEROBIC AND ANAEROBIC Blood Culture adequate volume   Culture  Setup Time   Final    GRAM POSITIVE COCCI AEROBIC BOTTLE ONLY CRITICAL RESULT CALLED TO, READ BACK BY AND VERIFIED WITH: PHARMD MASTERS, A [id number] FCP    Culture (A)  Final    STAPHYLOCOCCUS SPECIES (COAGULASE NEGATIVE) THE SIGNIFICANCE OF ISOLATING THIS ORGANISM FROM A SINGLE VENIPUNCTURE CANNOT BE PREDICTED WITHOUT FURTHER CLINICAL AND CULTURE CORRELATION. SUSCEPTIBILITIES AVAILABLE ONLY ON REQUEST. Performed at [facility], [place] [address], [place], [place] [address]    Report Status 09/25/2019 FINAL  Final     Scheduled Meds: . amLODipine  10 mg Oral Daily  . aspirin EC  81 mg Oral Daily  . carvedilol  25 mg Oral BID  . dexamethasone (DECADRON) injection  6 mg Intravenous Q24H  . enoxaparin (LOVENOX) injection  40 mg Subcutaneous Q24H  . escitalopram  30 mg Oral Daily  . insulin aspart  0-20 Units Subcutaneous TID WC  . insulin aspart  0-5 Units Subcutaneous QHS  . insulin aspart  20 Units Subcutaneous TID WC  . insulin detemir  28 Units Subcutaneous BID  . linagliptin  5 mg Oral Daily  . mirtazapine  7.5 mg Oral QHS  . pravastatin  10 mg Oral Daily  . pregabalin  75 mg Oral TID  . senna-docusate  1 tablet Oral BID  . vitamin C  500 mg Oral Daily  . zinc sulfate  220 mg Oral Daily     LOS: 6 days   Cherene Altes, MD [facility] Office  [phone] Pager - Text Page per Amion  If 7PM-7AM, please contact night-coverage per Amion 09/28/2019, 9:56 AM

## 2019-09-28 NOTE — Progress Notes (Signed)
Pt now says symptoms are waxing & waning. Symptoms come and go.  Pt resting in bed.

## 2019-09-28 NOTE — Plan of Care (Signed)
Spoke with pt daughter Lattie Haw. Update regarding pt care and plan for discharge on tomorrow. Daughter will like to speak with provider prior to discharge.

## 2019-09-28 NOTE — Progress Notes (Signed)
Pt c/o burning sensation epigastric area. Pt is lying down and VSS. RA sats 94%. BP 143/63. Will advise MD.

## 2019-09-29 LAB — GLUCOSE, CAPILLARY
Glucose-Capillary: 59 mg/dL — ABNORMAL LOW (ref 70–99)
Glucose-Capillary: 134 mg/dL — ABNORMAL HIGH (ref 70–99)
Glucose-Capillary: 113 mg/dL — ABNORMAL HIGH (ref 70–99)
Glucose-Capillary: 79 mg/dL (ref 70–99)

## 2019-09-29 MED ORDER — ACETAMINOPHEN 325 MG PO TABS: 650.0000 mg | ORAL_TABLET | Freq: Four times a day (QID) | ORAL | Status: DC | PRN

## 2019-09-29 MED ORDER — LINAGLIPTIN 5 MG PO TABS: 5.0000 mg | ORAL_TABLET | Freq: Every day | ORAL | 1 refills | Status: DC

## 2019-09-29 NOTE — Progress Notes (Signed)
Pt given discharge instructions and discharge instructions explained to pt. Daughter Lattie Haw advised of discharge info. Pt discharged home with family. Pt will contact PCP. IV removed and site covered with guaze and tape. Px to picked up from [facility]. Pt has no complaints at this time.

## 2019-09-29 NOTE — Discharge Instructions (Signed)
Date of Positive COVID Test: 09/20/2019  Date Quarantine Ends: 10/11/2019    COVID-19 COVID-19 is a respiratory infection that is caused by a virus called severe acute respiratory syndrome coronavirus 2 (SARS-CoV-2). The disease is also known as coronavirus disease or novel coronavirus. In some people, the virus may not cause any symptoms. In others, it may cause a serious infection. The infection can get worse quickly and can lead to complications, such as:  Pneumonia, or infection of the lungs.  Acute respiratory distress syndrome or ARDS. This is fluid build-up in the lungs.  Acute respiratory failure. This is a condition in which there is not enough oxygen passing from the lungs to the body.  Sepsis or septic shock. This is a serious bodily reaction to an infection.  Blood clotting problems.  Secondary infections due to bacteria or fungus. The virus that causes COVID-19 is contagious. This means that it can spread from person to person through droplets from coughs and sneezes (respiratory secretions). What are the causes? This illness is caused by a virus. You may catch the virus by:  Breathing in droplets from an infected person's cough or sneeze.  Touching something, like a table or a doorknob, that was exposed to the virus (contaminated) and then touching your mouth, nose, or eyes. What increases the risk? Risk for infection You are more likely to be infected with this virus if you:  Live in or travel to an area with a COVID-19 outbreak.  Come in contact with a sick person who recently traveled to an area with a COVID-19 outbreak.  Provide care for or live with a person who is infected with COVID-19. Risk for serious illness You are more likely to become seriously ill from the virus if you:  Are 39 years of age or older.  Have a long-term disease that lowers your body's ability to fight infection (immunocompromised).  Live in a nursing home or long-term care  facility.  Have a long-term (chronic) disease such as: ? Chronic lung disease, including chronic obstructive pulmonary disease or asthma ? Heart disease. ? Diabetes. ? Chronic kidney disease. ? Liver disease.  Are obese. What are the signs or symptoms? Symptoms of this condition can range from mild to severe. Symptoms may appear any time from 2 to 14 days after being exposed to the virus. They include:  A fever.  A cough.  Difficulty breathing.  Chills.  Muscle pains.  A sore throat.  Loss of taste or smell. Some people may also have stomach problems, such as nausea, vomiting, or diarrhea. Other people may not have any symptoms of COVID-19. How is this diagnosed? This condition may be diagnosed based on:  Your signs and symptoms, especially if: ? You live in an area with a COVID-19 outbreak. ? You recently traveled to or from an area where the virus is common. ? You provide care for or live with a person who was diagnosed with COVID-19.  A physical exam.  Lab tests, which may include: ? A nasal swab to take a sample of fluid from your nose. ? A throat swab to take a sample of fluid from your throat. ? A sample of mucus from your lungs (sputum). ? Blood tests.  Imaging tests, which may include, X-rays, CT scan, or ultrasound. How is this treated? At present, there is no medicine to treat COVID-19. Medicines that treat other diseases are being used on a trial basis to see if they are effective against COVID-19. Your health  care provider will talk with you about ways to treat your symptoms. For most people, the infection is mild and can be managed at home with rest, fluids, and over-the-counter medicines. Treatment for a serious infection usually takes places in a hospital intensive care unit (ICU). It may include one or more of the following treatments. These treatments are given until your symptoms improve.  Receiving fluids and medicines through an  IV.  Supplemental oxygen. Extra oxygen is given through a tube in the nose, a face mask, or a hood.  Positioning you to lie on your stomach (prone position). This makes it easier for oxygen to get into the lungs.  Continuous positive airway pressure (CPAP) or bi-level positive airway pressure (BPAP) machine. This treatment uses mild air pressure to keep the airways open. A tube that is connected to a motor delivers oxygen to the body.  Ventilator. This treatment moves air into and out of the lungs by using a tube that is placed in your windpipe.  Tracheostomy. This is a procedure to create a hole in the neck so that a breathing tube can be inserted.  Extracorporeal membrane oxygenation (ECMO). This procedure gives the lungs a chance to recover by taking over the functions of the heart and lungs. It supplies oxygen to the body and removes carbon dioxide. Follow these instructions at home: Lifestyle  If you are sick, stay home except to get medical care. Your health care provider will tell you how long to stay home. Call your health care provider before you go for medical care.  Rest at home as told by your health care provider.  Do not use any products that contain nicotine or tobacco, such as cigarettes, e-cigarettes, and chewing tobacco. If you need help quitting, ask your health care provider.  Return to your normal activities as told by your health care provider. Ask your health care provider what activities are safe for you. General instructions  Take over-the-counter and prescription medicines only as told by your health care provider.  Drink enough fluid to keep your urine pale yellow.  Keep all follow-up visits as told by your health care provider. This is important. How is this prevented?  There is no vaccine to help prevent COVID-19 infection. However, there are steps you can take to protect yourself and others from this virus. To protect yourself:   Do not travel to areas  where COVID-19 is a risk. The areas where COVID-19 is reported change often. To identify high-risk areas and travel restrictions, check the CDC travel website: FatFares.com.br  If you live in, or must travel to, an area where COVID-19 is a risk, take precautions to avoid infection. ? Stay away from people who are sick. ? Wash your hands often with soap and water for 20 seconds. If soap and water are not available, use an alcohol-based hand sanitizer. ? Avoid touching your mouth, face, eyes, or nose. ? Avoid going out in public, follow guidance from your state and local health authorities. ? If you must go out in public, wear a cloth face covering or face mask. ? Disinfect objects and surfaces that are frequently touched every day. This may include:  Counters and tables.  Doorknobs and light switches.  Sinks and faucets.  Electronics, such as phones, remote controls, keyboards, computers, and tablets. To protect others: If you have symptoms of COVID-19, take steps to prevent the virus from spreading to others.  If you think you have a COVID-19 infection, contact your health  care provider right away. Tell your health care team that you think you may have a COVID-19 infection.  Stay home. Leave your house only to seek medical care. Do not use public transport.  Do not travel while you are sick.  Wash your hands often with soap and water for 20 seconds. If soap and water are not available, use alcohol-based hand sanitizer.  Stay away from other members of your household. Let healthy household members care for children and pets, if possible. If you have to care for children or pets, wash your hands often and wear a mask. If possible, stay in your own room, separate from others. Use a different bathroom.  Make sure that all people in your household wash their hands well and often.  Cough or sneeze into a tissue or your sleeve or elbow. Do not cough or sneeze into your hand or  into the air.  Wear a cloth face covering or face mask. Where to find more information  Centers for Disease Control and Prevention: PurpleGadgets.be  World Health Organization: https://www.castaneda.info/ Contact a health care provider if:  You live in or have traveled to an area where COVID-19 is a risk and you have symptoms of the infection.  You have had contact with someone who has COVID-19 and you have symptoms of the infection. Get help right away if:  You have trouble breathing.  You have pain or pressure in your chest.  You have confusion.  You have bluish lips and fingernails.  You have difficulty waking from sleep.  You have symptoms that get worse. These symptoms may represent a serious problem that is an emergency. Do not wait to see if the symptoms will go away. Get medical help right away. Call your local emergency services (911 in the [place] Do not drive yourself to the hospital. Let the emergency medical personnel know if you think you have COVID-19. Summary  COVID-19 is a respiratory infection that is caused by a virus. It is also known as coronavirus disease or novel coronavirus. It can cause serious infections, such as pneumonia, acute respiratory distress syndrome, acute respiratory failure, or sepsis.  The virus that causes COVID-19 is contagious. This means that it can spread from person to person through droplets from coughs and sneezes.  You are more likely to develop a serious illness if you are 93 years of age or older, have a weak immunity, live in a nursing home, or have chronic disease.  There is no medicine to treat COVID-19. Your health care provider will talk with you about ways to treat your symptoms.  Take steps to protect yourself and others from infection. Wash your hands often and disinfect objects and surfaces that are frequently touched every day. Stay away from people who are sick and wear a mask if you  are sick. This information is not intended to replace advice given to you by your health care provider. Make sure you discuss any questions you have with your health care provider. Document Released: 11/02/2018 Document Revised: 02/22/2019 Document Reviewed: 11/02/2018 Elsevier Patient Education  2020 [place]: How to Protect Yourself and Others Know how it spreads  There is currently no vaccine to prevent coronavirus disease 2019 (COVID-19).  The best way to prevent illness is to avoid being exposed to this virus.  The virus is thought to spread mainly from person-to-person. ? Between people who are in close contact with one another (within about 6 feet). ? Through respiratory droplets produced  when an infected person coughs, sneezes or talks. ? These droplets can land in the mouths or noses of people who are nearby or possibly be inhaled into the lungs. ? Some recent studies have suggested that COVID-19 may be spread by people who are not showing symptoms. Everyone should Clean your hands often  Wash your hands often with soap and water for at least 20 seconds especially after you have been in a public place, or after blowing your nose, coughing, or sneezing.  If soap and water are not readily available, use a hand sanitizer that contains at least 60% alcohol. Cover all surfaces of your hands and rub them together until they feel dry.  Avoid touching your eyes, nose, and mouth with unwashed hands. Avoid close contact  Stay home if you are sick.  Avoid close contact with people who are sick.  Put distance between yourself and other people. ? Remember that some people without symptoms may be able to spread virus. ? This is especially important for people who are at higher risk of getting very GainPain.com.cy Cover your mouth and nose with a cloth face cover when around others  You could spread  COVID-19 to others even if you do not feel sick.  Everyone should wear a cloth face cover when they have to go out in public, for example to the grocery store or to pick up other necessities. ? Cloth face coverings should not be placed on young children under age 34, anyone who has trouble breathing, or is unconscious, incapacitated or otherwise unable to remove the mask without assistance.  The cloth face cover is meant to protect other people in case you are infected.  Do NOT use a facemask meant for a Dietitian.  Continue to keep about 6 feet between yourself and others. The cloth face cover is not a substitute for social distancing. Cover coughs and sneezes  If you are in a private setting and do not have on your cloth face covering, remember to always cover your mouth and nose with a tissue when you cough or sneeze or use the inside of your elbow.  Throw used tissues in the trash.  Immediately wash your hands with soap and water for at least 20 seconds. If soap and water are not readily available, clean your hands with a hand sanitizer that contains at least 60% alcohol. Clean and disinfect  Clean AND disinfect frequently touched surfaces daily. This includes tables, doorknobs, light switches, countertops, handles, desks, phones, keyboards, toilets, faucets, and sinks. RackRewards.fr  If surfaces are dirty, clean them: Use detergent or soap and water prior to disinfection.  Then, use a household disinfectant. You can see a list of EPA-registered household disinfectants here. michellinders.com 02/13/2019 This information is not intended to replace advice given to you by your health care provider. Make sure you discuss any questions you have with your health care provider. Document Released: 01/23/2019 Document Revised: 02/21/2019 Document Reviewed: 01/23/2019 Elsevier Patient Education  2020 [employer].   COVID-19 Frequently Asked Questions COVID-19 (coronavirus disease) is an infection that is caused by a large family of viruses. Some viruses cause illness in people and others cause illness in animals like camels, cats, and bats. In some cases, the viruses that cause illness in animals can spread to humans. Where did the coronavirus come from? In December 2019, [place] told the [facility]) of several cases of lung disease (human respiratory illness). These cases were linked to an open seafood  and livestock market in the city of [place]. The link to the seafood and livestock market suggests that the virus may have spread from animals to humans. However, since that first outbreak in December, the virus has also been shown to spread from person to person. What is the name of the disease and the virus? Disease name Early on, this disease was called novel coronavirus. This is because scientists determined that the disease was caused by a new (novel) respiratory virus. The World Health Organization [facility]) has now named the disease COVID-19, or coronavirus disease. Virus name The virus that causes the disease is called severe acute respiratory syndrome coronavirus 2 (SARS-CoV-2). More information on disease and virus naming World Health Organization [facility]): www.who.int/emergencies/diseases/novel-coronavirus-2019/technical-guidance/naming-the-coronavirus-disease-(covid-2019)-and-the-virus-that-causes-it Who is at risk for complications from coronavirus disease? Some people may be at higher risk for complications from coronavirus disease. This includes older adults and people who have chronic diseases, such as heart disease, diabetes, and lung disease. If you are at higher risk for complications, take these extra precautions:  Avoid close contact with people who are sick or have a fever or cough. Stay at least 3-6 ft (1-2 m) away from them, if possible.  Wash your hands often with  soap and water for at least 20 seconds.  Avoid touching your face, mouth, nose, or eyes.  Keep supplies on hand at home, such as food, medicine, and cleaning supplies.  Stay home as much as possible.  Avoid social gatherings and travel. How does coronavirus disease spread? The virus that causes coronavirus disease spreads easily from person to person (is contagious). There are also cases of community-spread disease. This means the disease has spread to:  People who have no known contact with other infected people.  People who have not traveled to areas where there are known cases. It appears to spread from one person to another through droplets from coughing or sneezing. Can I get the virus from touching surfaces or objects? There is still a lot that we do not know about the virus that causes coronavirus disease. Scientists are basing a lot of information on what they know about similar viruses, such as:  Viruses cannot generally survive on surfaces for long. They need a human body (host) to survive.  It is more likely that the virus is spread by close contact with people who are sick (direct contact), such as through: ? Shaking hands or hugging. ? Breathing in respiratory droplets that travel through the air. This can happen when an infected person coughs or sneezes on or near other people.  It is less likely that the virus is spread when a person touches a surface or object that has the virus on it (indirect contact). The virus may be able to enter the body if the person touches a surface or object and then touches his or her face, eyes, nose, or mouth. Can a person spread the virus without having symptoms of the disease? It may be possible for the virus to spread before a person has symptoms of the disease, but this is most likely not the main way the virus is spreading. It is more likely for the virus to spread by being in close contact with people who are sick and breathing in the  respiratory droplets of a sick person's cough or sneeze. What are the symptoms of coronavirus disease? Symptoms vary from person to person and can range from mild to severe. Symptoms may include:  Fever.  Cough.  Tiredness, weakness, or fatigue.  Fast breathing or feeling short of breath. These symptoms can appear anywhere from 2 to 14 days after you have been exposed to the virus. If you develop symptoms, call your health care provider. People with severe symptoms may need hospital care. If I am exposed to the virus, how long does it take before symptoms start? Symptoms of coronavirus disease may appear anywhere from 2 to 14 days after a person has been exposed to the virus. If you develop symptoms, call your health care provider. Should I be tested for this virus? Your health care provider will decide whether to test you based on your symptoms, history of exposure, and your risk factors. How does a health care provider test for this virus? Health care providers will collect samples to send for testing. Samples may include:  Taking a swab of fluid from the nose.  Taking fluid from the lungs by having you cough up mucus (sputum) into a sterile cup.  Taking a blood sample.  Taking a stool or urine sample. Is there a treatment or vaccine for this virus? Currently, there is no vaccine to prevent coronavirus disease. Also, there are no medicines like antibiotics or antivirals to treat the virus. A person who becomes sick is given supportive care, which means rest and fluids. A person may also relieve his or her symptoms by using over-the-counter medicines that treat sneezing, coughing, and runny nose. These are the same medicines that a person takes for the common cold. If you develop symptoms, call your health care provider. People with severe symptoms may need hospital care. What can I do to protect myself and my family from this virus?     You can protect yourself and your family by  taking the same actions that you would take to prevent the spread of other viruses. Take the following actions:  Wash your hands often with soap and water for at least 20 seconds. If soap and water are not available, use alcohol-based hand sanitizer.  Avoid touching your face, mouth, nose, or eyes.  Cough or sneeze into a tissue, sleeve, or elbow. Do not cough or sneeze into your hand or the air. ? If you cough or sneeze into a tissue, throw it away immediately and wash your hands.  Disinfect objects and surfaces that you frequently touch every day.  Avoid close contact with people who are sick or have a fever or cough. Stay at least 3-6 ft (1-2 m) away from them, if possible.  Stay home if you are sick, except to get medical care. Call your health care provider before you get medical care.  Make sure your vaccines are up to date. Ask your health care provider what vaccines you need. What should I do if I need to travel? Follow travel recommendations from your local health authority, the CDC, and WHO. Travel information and advice  Centers for Disease Control and Prevention (CDC): BodyEditor.hu  World Health Organization [facility]): ThirdIncome.ca Know the risks and take action to protect your health  You are at higher risk of getting coronavirus disease if you are traveling to areas with an outbreak or if you are exposed to travelers from areas with an outbreak.  Wash your hands often and practice good hygiene to lower the risk of catching or spreading the virus. What should I do if I am sick? General instructions to stop the spread of infection  Wash your hands often with soap and water for at least 20 seconds. If soap and water  are not available, use alcohol-based hand sanitizer.  Cough or sneeze into a tissue, sleeve, or elbow. Do not cough or sneeze into your hand or the air.  If you  cough or sneeze into a tissue, throw it away immediately and wash your hands.  Stay home unless you must get medical care. Call your health care provider or local health authority before you get medical care.  Avoid public areas. Do not take public transportation, if possible.  If you can, wear a mask if you must go out of the house or if you are in close contact with someone who is not sick. Keep your home clean  Disinfect objects and surfaces that are frequently touched every day. This may include: ? Counters and tables. ? Doorknobs and light switches. ? Sinks and faucets. ? Electronics such as phones, remote controls, keyboards, computers, and tablets.  Wash dishes in hot, soapy water or use a dishwasher. Air-dry your dishes.  Wash laundry in hot water. Prevent infecting other household members  Let healthy household members care for children and pets, if possible. If you have to care for children or pets, wash your hands often and wear a mask.  Sleep in a different bedroom or bed, if possible.  Do not share personal items, such as razors, toothbrushes, deodorant, combs, brushes, towels, and washcloths. Where to find more information Centers for Disease Control and Prevention (CDC)  Information and news updates: https://www.butler-gonzalez.com/ World Health Organization [facility])  Information and news updates: MissExecutive.com.ee  Coronavirus health topic: https://www.castaneda.info/  Questions and answers on COVID-19: OpportunityDebt.at  Global tracker: who.sprinklr.com American Academy of Pediatrics (AAP)  Information for families: www.healthychildren.org/English/health-issues/conditions/chest-lungs/Pages/2019-Novel-Coronavirus.aspx The coronavirus situation is changing rapidly. Check your local health authority website or the CDC and [facility] websites for updates and news. When should I contact a  health care provider?  Contact your health care provider if you have symptoms of an infection, such as fever or cough, and you: ? Have been near anyone who is known to have coronavirus disease. ? Have come into contact with a person who is suspected to have coronavirus disease. ? Have traveled outside of the country. When should I get emergency medical care?  Get help right away by calling your local emergency services (911 in the [place] if you have: ? Trouble breathing. ? Pain or pressure in your chest. ? Confusion. ? Blue-tinged lips and fingernails. ? Difficulty waking from sleep. ? Symptoms that get worse. Let the emergency medical personnel know if you think you have coronavirus disease. Summary  A new respiratory virus is spreading from person to person and causing COVID-19 (coronavirus disease).  The virus that causes COVID-19 appears to spread easily. It spreads from one person to another through droplets from coughing or sneezing.  Older adults and those with chronic diseases are at higher risk of disease. If you are at higher risk for complications, take extra precautions.  There is currently no vaccine to prevent coronavirus disease. There are no medicines, such as antibiotics or antivirals, to treat the virus.  You can protect yourself and your family by washing your hands often, avoiding touching your face, and covering your coughs and sneezes. This information is not intended to replace advice given to you by your health care provider. Make sure you discuss any questions you have with your health care provider. Document Released: 01/23/2019 Document Revised: 01/23/2019 Document Reviewed: 01/23/2019 Elsevier Patient Education  [place].

## 2019-09-29 NOTE — Progress Notes (Signed)
Pt ambulated in room on RA and sats remained b/w 89-92%. Pt has no distress with activity. Will call pts daughter to update.

## 2019-09-29 NOTE — TOC Transition Note (Signed)
Transition of Care [facility]) - CM/SW Discharge Note   Patient Details  Name: Deborah Weeks MRN: [id number] Date of Birth: 11-Dec-1945  Transition of Care [facility]) CM/SW Contact:  Geralynn Ochs, LCSW Phone Number: 09/29/2019, 2:51 PM   Clinical Narrative:   Patient discharging home. Spoke with Lattie Haw, patient's daughter, and sent 3N1 order to [employer]. Notified AC to deliver to patient's room. No other needs at this time.    Final next level of care: Home/Self Care Barriers to Discharge: Barriers Resolved   Patient Goals and CMS Choice        Discharge Placement                       Discharge Plan and Services                DME Arranged: 3-N-1 DME Agency: [employer] Date DME Agency Contacted: 09/29/19 Time DME Agency Contacted: [id number] Representative spoke with at DME Agency: [place] ([place]) Interventions     Readmission Risk Interventions No flowsheet data found.

## 2019-09-29 NOTE — Discharge Summary (Signed)
DISCHARGE SUMMARY  Deborah Weeks  MR#: [id number]  DOB:March 29, 1946  Date of Admission: 09/22/2019 Date of Discharge: 09/29/2019  Attending Physician:Shalonda Sachse Hennie Duos, MD  Patient's FYB:[id number], Deborah Picket, MD  Consults: none  Disposition: D/C home   Date of Positive COVID Test: 09/20/2019  Date Quarantine Ends: 10/11/2019  COVID-19 specific Treatment: Remdesivir 12/12 > 12/16 Solu-Medrol 12/12 > 12/14 Decadron 12/14 > 12/19  Follow-up Appts: Follow-up Information    Buzzy Han, MD Follow up in 1 week(s).   Specialty: Family Medicine Contact information: [place] [address] [phone]        Jerline Pain, MD .   Specialty: Cardiology Contact information: [phone] [place] [address] [phone]           Tests Needing Follow-up: -CBG will need to be followed  -assure patient is resuming normal activities/becoming more active -assess BP control   Discharge Diagnoses: COVID Pneumonia Acute hypoxic respiratory failure DM2 - uncontrolled w/ hyperglycemia  Hypertension Depression Neuropathy Stage III chronic kidney disease  Initial presentation: 73 year old with a history of HTN, DM, HLD, and lupus who presented to the ED with complaints of 3 days of cough and shortness of breath.  She had been diagnosed with Covid 2 days prior during a different ED visit.  She continued to have poor appetite, loose stools, and worsening shortness of breath and therefore returned to the ER on the date that she was admitted, 12/12.  At that time she was found to have a temperature of 1014 and was tachycardic and tachypneic.  CXR noted multifocal bilateral pulmonary infiltrates.  Hospital Course:  COVID Pneumonia - acute hypoxic respiratory failure Has completed a course of remdesivir - completed and 8-day course of steroid tx - weaned to room air prior to discharge with saturations  maintaining 88% or greater   DM2 - uncontrolled w/ hyperglycemia  CBG improving at time of d/c - steroid stopped prior to d/c - A1c 7.5 on oral meds only prior to admit/use of steroids - stopping insulin at time of d/c - Trandjenta added to regimen - CBG will need to be followed   Hypertension Blood pressure reasonably controlled at time of d/c   Depression Continue home doses of Remeron and Lexapro   Neuropathy Continue Lyrica  Stage III chronic kidney disease Creatinine stable and appears to be at her baseline at time of d/c     Allergies as of 09/29/2019      Reactions   Atorvastatin Other (See Comments)   Cramps   Escitalopram Oxalate Other (See Comments)   Insomnia, but patient currently takes this   Codeine Nausea And Vomiting   Niacin And Related Itching, Rash      Medication List    STOP taking these medications   acetaminophen 650 MG CR tablet Commonly known as: TYLENOL Replaced by: acetaminophen 325 MG tablet   levofloxacin 750 MG tablet Commonly known as: Levaquin     TAKE these medications   acetaminophen 325 MG tablet Commonly known as: TYLENOL Take 2 tablets (650 mg total) by mouth every 6 (six) hours as needed for mild pain or headache (fever >/= 101). Replaces: acetaminophen 650 MG CR tablet   albuterol 108 (90 Base) MCG/ACT inhaler Commonly known as: VENTOLIN HFA Inhale 2 puffs every 4 (four) hours as needed into the lungs for wheezing or shortness of breath.   amLODipine 10 MG tablet Commonly known as: NORVASC Take 1 tablet (10 mg total) by mouth daily.  aspirin EC 81 MG tablet Take 81 mg by mouth daily.   carvedilol 25 MG tablet Commonly known as: COREG Take 25 mg by mouth 2 (two) times daily.   diclofenac 75 MG EC tablet Commonly known as: VOLTAREN TAKE 1 TABLET BY MOUTH TWICE A DAY What changed:   when to take this  reasons to take this   escitalopram 20 MG tablet Commonly known as: LEXAPRO Take 30 mg by mouth  daily.   furosemide 20 MG tablet Commonly known as: LASIX Take 1 tablet (20 mg total) by mouth 2 (two) times daily.   linagliptin 5 MG Tabs tablet Commonly known as: TRADJENTA Take 1 tablet (5 mg total) by mouth daily. Start taking on: September 30, 2019   Lyrica 75 MG capsule Generic drug: pregabalin Take 75 mg by mouth 3 (three) times daily.   metFORMIN 500 MG tablet Commonly known as: GLUCOPHAGE Take 1,000 mg by mouth 2 (two) times daily with a meal.   mirtazapine 15 MG tablet Commonly known as: REMERON Take 7.5 mg by mouth at bedtime.   OneTouch Delica Plus [id number] Misc 3 (three) times daily.   OneTouch Verio Flex System w/Device Kit 2 (two) times daily.   OneTouch Verio test strip Generic drug: glucose blood 3 (three) times daily.   pravastatin 10 MG tablet Commonly known as: PRAVACHOL Take 10 mg by mouth daily.       Day of Discharge BP 139/67 (BP Location: Right Arm)   Pulse 61   Temp 98.5 F (36.9 C) (Oral)   Resp 18   Ht _0  (1.702 m)   Wt 97 kg   LMP  (LMP Unknown)   SpO2 94%   BMI 33.49 kg/m   Physical Exam: General: No acute respiratory distress Lungs: Clear to auscultation bilaterally without wheezes or crackles Cardiovascular: Regular rate and rhythm without murmur gallop or rub normal S1 and S2 Abdomen: Nontender, nondistended, soft, bowel sounds positive, no rebound, no ascites, no appreciable mass Extremities: No significant cyanosis, clubbing, or edema bilateral lower extremities  Basic Metabolic Panel: Recent Labs  Lab 09/22/19 2009 09/24/19 0350 09/25/19 0340 09/26/19 0035 09/27/19 0040  NA  --  143 143 142 143  K  --  4.7 4.7 4.2 4.6  CL  --  108 107 107 106  CO2  --  _1 GLUCOSE  --  322* 283* 224* 166*  BUN  --  43* 42* 39* 42*  CREATININE 1.28* 1.11* 1.01* 0.92 0.93  CALCIUM  --  9.7 9.5 9.1 9.1  MG  --  2.2 2.2 2.0 2.3    Liver Function Tests: Recent Labs  Lab 09/24/19 0350 09/25/19 0340  09/26/19 0035 09/27/19 0040  AST 42* 31 28 36  ALT _2 ALKPHOS 52 55 51 55  BILITOT 0.6 0.9 0.8 0.7  PROT 7.1 6.6 6.0* 6.2*  ALBUMIN 3.2* 2.9* 2.8* 2.8*    CBC: Recent Labs  Lab 09/22/19 2009 09/24/19 0350 09/25/19 0340 09/26/19 0035 09/27/19 0040  WBC 2.6* 10.1 9.5 8.8 8.9  NEUTROABS  --  7.8* 7.7 7.0 6.9  HGB 12.5 12.3 11.7* 11.5* 11.7*  HCT 39.9 40.5 38.3 36.9 37.4  MCV 81.3 82.5 81.5 80.7 81.0  PLT 273 380 374 375 425*    BNP (last 3 results) Recent Labs    10/18/18 0235 09/22/19 2009  BNP 332.5* 131.7*    CBG: Recent Labs  Lab 09/28/19 1558 09/28/19 2142 09/29/19 0719 09/29/19  7588 09/29/19 1224  GLUCAP 365* 159* 59* 134* 113*    Recent Results (from the past 240 hour(s))  SARS CORONAVIRUS 2 (TAT 6-24 HRS)     Status: Abnormal   Collection Time: 09/20/19  3:26 AM  Result Value Ref Range Status   SARS Coronavirus 2 POSITIVE (A) NEGATIVE Final    Comment: RESULT CALLED TO, READ BACK BY AND VERIFIED WITH: EMAILED Aretta Nip 0128 09/21/19 G.MCADOO (NOTE) SARS-CoV-2 target nucleic acids are DETECTED. The SARS-CoV-2 RNA is generally detectable in upper and lower respiratory specimens during the acute phase of infection. Positive results are indicative of the presence of SARS-CoV-2 RNA. Clinical correlation with patient history and other diagnostic information is  necessary to determine patient infection status. Positive results do not rule out bacterial infection or co-infection with other viruses.  The expected result is Negative. Fact Sheet for Patients: SugarRoll.be Fact Sheet for Healthcare Providers: https://www.woods-mathews.com/ This test is not yet approved or cleared by the [place] FDA and  has been authorized for detection and/or diagnosis of SARS-CoV-2 by FDA under an Emergency Use Authorization (EUA). This EUA will remain  in effect (meaning this test can be used)  for the duration  of the COVID-19 declaration under Section 564(b)(1) of the Act, 21 U.S.C. section 360bbb-3(b)(1), unless the authorization is terminated or revoked sooner. Performed at [facility], [place] [address], [place], [place] [address]   Culture, blood (Routine X 2) w Reflex to ID Panel     Status: Abnormal   Collection Time: 09/22/19  9:43 PM   Specimen: BLOOD  Result Value Ref Range Status   Specimen Description BLOOD RIGHT ANTECUBITAL  Final   Special Requests   Final    BOTTLES DRAWN AEROBIC AND ANAEROBIC Blood Culture adequate volume   Culture  Setup Time   Final    GRAM POSITIVE COCCI AEROBIC BOTTLE ONLY CRITICAL RESULT CALLED TO, READ BACK BY AND VERIFIED WITH: PHARMD MASTERS, A [id number] FCP    Culture (A)  Final    STAPHYLOCOCCUS SPECIES (COAGULASE NEGATIVE) THE SIGNIFICANCE OF ISOLATING THIS ORGANISM FROM A SINGLE VENIPUNCTURE CANNOT BE PREDICTED WITHOUT FURTHER CLINICAL AND CULTURE CORRELATION. SUSCEPTIBILITIES AVAILABLE ONLY ON REQUEST. Performed at [facility], [place] [address] [place], [place] [address]    Report Status 09/25/2019 FINAL  Final      Time spent in discharge (includes decision making & examination of pt): 35 minutes  09/29/2019, 2:43 PM   Cherene Altes, MD [facility] Office  [phone]

## 2019-09-29 NOTE — Progress Notes (Signed)
Delivery of BSC at bedside. VSS but hr 56. No distress noted. Per pt her daughter will pick her up.

## 2019-09-29 NOTE — Progress Notes (Signed)
Pt sitting on side of bed. No distress noted. Pt on RA and sats WNL. No reports of pain. Decreased appetite remains. Pt is drinking sufficiently. Pt has had formed soft BMs daily. Coughing has decreased this AM.

## 2019-09-29 NOTE — Plan of Care (Signed)

## 2019-10-28 ENCOUNTER — Other Ambulatory Visit: Payer: Self-pay | Admitting: Sports Medicine

## 2019-11-02 ENCOUNTER — Encounter: Payer: Self-pay | Admitting: Cardiology

## 2019-11-02 ENCOUNTER — Ambulatory Visit (INDEPENDENT_AMBULATORY_CARE_PROVIDER_SITE_OTHER): Payer: Medicare Other | Admitting: Cardiology

## 2019-11-02 ENCOUNTER — Encounter (INDEPENDENT_AMBULATORY_CARE_PROVIDER_SITE_OTHER): Payer: Self-pay

## 2019-11-02 ENCOUNTER — Other Ambulatory Visit: Payer: Self-pay

## 2019-11-02 VITALS — BP 126/70 | HR 68 | Ht 67.0 in | Wt 208.0 lb

## 2019-11-02 DIAGNOSIS — I5032 Chronic diastolic (congestive) heart failure: Secondary | ICD-10-CM | POA: Diagnosis not present

## 2019-11-02 DIAGNOSIS — I10 Essential (primary) hypertension: Secondary | ICD-10-CM

## 2019-11-02 DIAGNOSIS — E119 Type 2 diabetes mellitus without complications: Secondary | ICD-10-CM

## 2019-11-02 NOTE — Progress Notes (Signed)
Cardiology Office Note:    Date:  11/02/2019   ID:  Samina, Weekes 04/04/1946, MRN [id number]  PCP:  Buzzy Han, MD  Cardiologist:  Candee Furbish, MD  Electrophysiologist:  None   Referring MD: Buzzy Han*     History of Present Illness:    Deborah Weeks is a 74 y.o. female here for the follow-up of chronic diastolic heart failure/shortness of breath with paroxysmal atrial fibrillation.   Has shortness of breath recurrent bronchitis cough lupus.  Had a BNP of 251 point and was told that she may have heart failure.  Nuclear stress test in 2010 showed no ischemia normal EF.  She has some shortness of breath when walking quickly or bending over cleaning mild edema in the ankles.  She was discharged from the hospital on 10/21/2018 after cough congestion shortness of breath with diagnosis of sepsis/acute respiratory failure with hypoxia secondary to lobar pneumonia with fever of 103.  Flu was negative.  Chest pain was noted likely secondary to bronchitis pneumonia nonpleuritic EKG showed no acute ST changes troponin was negative x3.  Acute on chronic diastolic heart failure was also noted during the hospitalization she was diuresed with IV Lasix initially and her home Lasix dose was increased to 20 mg twice a day from once a day.  She was also found to have paroxysmal atrial fibrillation with rapid ventricular response.  She was continued on carvedilol 25 mg a day.  She had some ongoing hemoptysis during the admission and therefore she is here today to discuss possibly starting Eliquis.  She is feeling much better since her hospitalization.  She did not think she would make it out of the hospital.  She was very sick.   11/02/2019-here for the follow-up of paroxysmal atrial fibrillation, chronic diastolic heart failure. COVID - remdesivir.   Echocardiogram 10/17/2018: - Left ventricle: The cavity size was normal. Systolic function was   normal. The  estimated ejection fraction was in the range of 60%   to 65%. Wall motion was normal; there were no regional wall   motion abnormalities. The study is not technically sufficient to   allow evaluation of LV diastolic function. - Aortic valve: Trileaflet; normal thickness, mildly calcified   leaflets. - Tricuspid valve: There was trivial regurgitation. - Pulmonary arteries: Systolic pressure could not be accurately   estimated.  Nuclear stress test 08/08/2018:  Nuclear stress EF: 71%. No wall motion abnormalities  There was no ST segment deviation noted during stress.  The study is normal. There are no significant perfusion defects or ischemia identified.  This is a low risk study.   Past Medical History:  Diagnosis Date  . Arthritis   . Diabetes mellitus without complication ([place])   . High cholesterol   . Hypertension   . Lupus ([place])   . Neuropathy     Past Surgical History:  Procedure Laterality Date  . BACK SURGERY    . LAPAROSCOPIC TUBAL LIGATION      Current Medications: Current Meds  Medication Sig  . acetaminophen (TYLENOL) 325 MG tablet Take 2 tablets (650 mg total) by mouth every 6 (six) hours as needed for mild pain or headache (fever >/= 101).  Marland Kitchen albuterol (PROVENTIL HFA;VENTOLIN HFA) 108 (90 Base) MCG/ACT inhaler Inhale 2 puffs every 4 (four) hours as needed into the lungs for wheezing or shortness of breath.  Marland Kitchen amLODipine (NORVASC) 10 MG tablet Take 1 tablet (10 mg total) by mouth daily.  Marland Kitchen aspirin EC 81 MG  tablet Take 81 mg by mouth daily.  . Blood Glucose Monitoring Suppl (ONETOUCH VERIO FLEX SYSTEM) w/Device KIT 2 (two) times daily.   . carvedilol (COREG) 25 MG tablet Take 25 mg by mouth 2 (two) times daily.   . diclofenac (VOLTAREN) 75 MG EC tablet TAKE 1 TABLET BY MOUTH TWICE A DAY  . furosemide (LASIX) 20 MG tablet Take 1 tablet (20 mg total) by mouth 2 (two) times daily.  . Lancets (ONETOUCH DELICA PLUS [id number]) MISC 3 (three) times daily.   Marland Kitchen  linagliptin (TRADJENTA) 5 MG TABS tablet Take 1 tablet (5 mg total) by mouth daily.  Marland Kitchen LYRICA 75 MG capsule Take 75 mg by mouth 3 (three) times daily.  . metFORMIN (GLUCOPHAGE) 500 MG tablet Take 1,000 mg by mouth 2 (two) times daily with a meal.  . ONETOUCH VERIO test strip 3 (three) times daily.   . pravastatin (PRAVACHOL) 10 MG tablet Take 10 mg by mouth daily.    Current Facility-Administered Medications for the 11/02/19 encounter (Office Visit) with Jerline Pain, MD  Medication  . triamcinolone acetonide (KENALOG) 10 MG/ML injection 10 mg     Allergies:   Atorvastatin, Escitalopram oxalate, Codeine, and Niacin and related   Social History   Socioeconomic History  . Marital status: Single    Spouse name: Not on file  . Number of children: Not on file  . Years of education: Not on file  . Highest education level: Not on file  Occupational History  . Not on file  Tobacco Use  . Smoking status: Former Research scientist (life sciences)  . Smokeless tobacco: Never Used  Substance and Sexual Activity  . Alcohol use: Never    Alcohol/week: 0.0 standard drinks  . Drug use: Never  . Sexual activity: Not on file  Other Topics Concern  . Not on file  Social History Narrative  . Not on file   Social Determinants of Health   Financial Resource Strain:   . Difficulty of Paying Living Expenses: Not on file  Food Insecurity:   . Worried About Charity fundraiser in the Last Year: Not on file  . Ran Out of Food in the Last Year: Not on file  Transportation Needs:   . Lack of Transportation (Medical): Not on file  . Lack of Transportation (Non-Medical): Not on file  Physical Activity:   . Days of Exercise per Week: Not on file  . Minutes of Exercise per Session: Not on file  Stress:   . Feeling of Stress : Not on file  Social Connections:   . Frequency of Communication with Friends and Family: Not on file  . Frequency of Social Gatherings with Friends and Family: Not on file  . Attends Religious  Services: Not on file  . Active Member of Clubs or Organizations: Not on file  . Attends Archivist Meetings: Not on file  . Marital Status: Not on file     Family History: The patient's family history includes Breast cancer in her cousin.  ROS:   Please see the history of present illness.    Denies any fevers chills nausea vomiting syncope bleeding all other systems reviewed and are negative.  EKGs/Labs/Other Studies Reviewed:    The following studies were reviewed today: Prior office notes echocardiogram stress test EKGs  EKG:  EKG is  ordered today.  Her EKGs reviewed, A. fib, sinus rhythm  Recent Labs: 09/22/2019: B Natriuretic Peptide 131.7 09/27/2019: ALT 29; BUN 42; Creatinine, Ser 0.93; Hemoglobin  11.7; Magnesium 2.3; Platelets 425; Potassium 4.6; Sodium 143  Recent Lipid Panel    Component Value Date/Time   CHOL 167 09/22/2019 2009   TRIG 147 09/22/2019 2009   HDL 28 (L) 09/22/2019 2009   CHOLHDL 6.0 09/22/2019 2009   VLDL 29 09/22/2019 2009   LDLCALC 110 (H) 09/22/2019 2009    Physical Exam:    VS:  BP 126/70   Pulse 68   Ht 5' 7"  (1.702 m)   Wt 208 lb (94.3 kg)   LMP  (LMP Unknown)   SpO2 92%   BMI 32.58 kg/m     Wt Readings from Last 3 Encounters:  11/02/19 208 lb (94.3 kg)  09/22/19 213 lb 13.5 oz (97 kg)  11/01/18 207 lb 6.4 oz (94.1 kg)     GEN: Well nourished, well developed, in no acute distress  HEENT: normal  Neck: no JVD, carotid bruits, or masses Cardiac: RRR; 1/6SM, rubs, or gallops,no edema  Respiratory:  clear to auscultation bilaterally, normal work of breathing GI: soft, nontender, nondistended, + BS MS: no deformity or atrophy  Skin: warm and dry, no rash Neuro:  Alert and Oriented x 3, Strength and sensation are intact Psych: euthymic mood, full affect   ASSESSMENT:    1. Chronic diastolic heart failure ([place])   2. Diabetes mellitus with coincident hypertension ([place])    PLAN:    In order of problems listed  above:  Paroxysmal atrial fibrillation in the setting of sepsis/acute hypoxic respiratory failure - EKG on 10/16/2018 was A. fib with heart rate in the low 100s.  Subsequent EKG at 10/20/2018 showed normal sinus rhythm. -Since she had mild hemoptysis during the hospitalization, no anticoagulation was utilized. -I think at this point, since she may have had a reversible cause to her atrial fibrillation i.e. sepsis, bronchitis, I would like to hold off on long-term anticoagulation.  Obviously if atrial fibrillation returns, we strongly need to recommend this once again as long as she has no bleeding issues.  She would be at increased stroke risk, CHADSVASc at least 4.  Once again discussed again today.  She does not wish to be on anticoagulation unless she absolutely has to. - Event monitor was reassuring back in 2020 without any evidence of atrial fibrillation.  Her skips that were noted in the ER were PVCs.  She did not have any atrial fibrillation during her COVID-19 hospitalization December 2020.  Shortness of breath -Previously secondary to bronchitis, perhaps a degree of diastolic heart failure as well. - Echocardiogram reassuring.  Stress test reassuring.  2019.  Chronic diastolic heart failure -Likely mild in her symptomatology, most likely because of an elevated BNP at one point.  She was diuresed mildly in the hospital and her Lasix was slightly increased to 20 mg twice a day at home.  This seems reasonable as long as her renal function remains fairly stable.  Continue with adequate blood pressure control and Lasix.  Keep current medications, we are giving her carvedilol for instance.  Diabetes with hypertension -Continuing with PCP management.  Medications reviewed as above.  We will see her back in 1 year.   Medication Adjustments/Labs and Tests Ordered: Current medicines are reviewed at length with the patient today.  Concerns regarding medicines are outlined above.  No orders of the  defined types were placed in this encounter.  No orders of the defined types were placed in this encounter.   Patient Instructions  Medication Instructions:  The current medical regimen is  effective;  continue present plan and medications.  *If you need a refill on your cardiac medications before your next appointment, please call your pharmacy*  Follow-Up: At [facility], you and your health needs are our priority.  As part of our continuing mission to provide you with exceptional heart care, we have created designated Provider Care Teams.  These Care Teams include your primary Cardiologist (physician) and Advanced Practice Providers (APPs -  Physician Assistants and Nurse Practitioners) who all work together to provide you with the care you need, when you need it.  Your next appointment:   12 month(s)  The format for your next appointment:   In Person  Provider:   Candee Furbish, MD  Thank you for choosing [facility]!!        Signed, Candee Furbish, MD  11/02/2019 8:35 AM    [place]

## 2019-11-02 NOTE — Patient Instructions (Signed)
Medication Instructions:  The current medical regimen is effective;  continue present plan and medications.  *If you need a refill on your cardiac medications before your next appointment, please call your pharmacy*  Follow-Up: At [facility], you and your health needs are our priority.  As part of our continuing mission to provide you with exceptional heart care, we have created designated Provider Care Teams.  These Care Teams include your primary Cardiologist (physician) and Advanced Practice Providers (APPs -  Physician Assistants and Nurse Practitioners) who all work together to provide you with the care you need, when you need it.  Your next appointment:   12 month(s)  The format for your next appointment:   In Person  Provider:   Mark Skains, MD   Thank you for choosing [facility]!!     

## 2019-11-20 ENCOUNTER — Other Ambulatory Visit: Payer: Self-pay

## 2019-11-20 ENCOUNTER — Ambulatory Visit (INDEPENDENT_AMBULATORY_CARE_PROVIDER_SITE_OTHER): Payer: Medicare Other | Admitting: Sports Medicine

## 2019-11-20 ENCOUNTER — Encounter: Payer: Self-pay | Admitting: Sports Medicine

## 2019-11-20 VITALS — Temp 96.5°F

## 2019-11-20 DIAGNOSIS — M79675 Pain in left toe(s): Secondary | ICD-10-CM

## 2019-11-20 DIAGNOSIS — B351 Tinea unguium: Secondary | ICD-10-CM | POA: Diagnosis not present

## 2019-11-20 DIAGNOSIS — E1142 Type 2 diabetes mellitus with diabetic polyneuropathy: Secondary | ICD-10-CM | POA: Diagnosis not present

## 2019-11-20 DIAGNOSIS — M79674 Pain in right toe(s): Secondary | ICD-10-CM | POA: Diagnosis not present

## 2019-11-20 NOTE — Progress Notes (Signed)
Patient ID: Deborah Weeks, female   DOB: 1946-04-04, 74 y.o.   MRN: [id number] Subjective: Deborah Weeks is a 74 y.o. female patient seen today in office for diabetic foot check and nail trim.  Reports that her blood sugar was 124 this morning and last A1C 7.5. Reports that she had COVID in December and stayed in the hospital for 1 week, now doing better. Reports there was a change to her diabetic medication but can not reccall the name at this time, No other pedal complaints noted.   Patient Active Problem List   Diagnosis Date Noted  . HLD (hyperlipidemia)   . Abnormal urinalysis 10/15/2018  . Hypokalemia 10/15/2018  . Chest pain 10/15/2018  . Acute respiratory failure with hypoxia ([place]) 10/14/2018  . Recurrent major depressive disorder, in full remission ([place]) 10/28/2014  . Multinodular goiter 08/30/2012  . Allergic rhinitis 05/30/2012  . DM (diabetes mellitus) ([place]) 01/05/2011  . Controlled type 2 diabetes mellitus without complication ([place]) 57/26/2035  . Hypertension 01/04/2011  . Osteopenia 12/07/2010  . Glaucoma 02/27/2010  . 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 Systemic lupus erythematosus ([place]) 02/27/2010    Current Outpatient Medications on File Prior to Visit  Medication Sig Dispense Refill  . acetaminophen (TYLENOL) 325 MG tablet Take 2 tablets (650 mg total) by mouth every 6 (six) hours as needed for mild pain or headache (fever >/= 101).    Marland Kitchen albuterol (PROVENTIL HFA;VENTOLIN HFA) 108 (90 Base) MCG/ACT inhaler Inhale 2 puffs every 4 (four) hours as needed into the lungs for wheezing or shortness of breath. 1 Inhaler 0  . amLODipine (NORVASC) 10 MG tablet Take 1 tablet (10 mg total) by mouth daily. 30 tablet 0  . aspirin EC 81 MG tablet Take 81 mg by mouth daily.    . Blood Glucose Monitoring Suppl (ONETOUCH VERIO FLEX SYSTEM) w/Device KIT 2 (two) times daily.     . carvedilol (COREG) 25 MG tablet Take 25 mg by mouth 2 (two) times daily.     .  diclofenac (VOLTAREN) 75 MG EC tablet TAKE 1 TABLET BY MOUTH TWICE A DAY 90 tablet 5  . furosemide (LASIX) 20 MG tablet Take 1 tablet (20 mg total) by mouth 2 (two) times daily. 180 tablet 3  . Lancets (ONETOUCH DELICA PLUS [id number]) MISC 3 (three) times daily.     Marland Kitchen linagliptin (TRADJENTA) 5 MG TABS tablet Take 1 tablet (5 mg total) by mouth daily. 30 tablet 1  . LYRICA 75 MG capsule Take 75 mg by mouth 3 (three) times daily.  1  . metFORMIN (GLUCOPHAGE) 500 MG tablet Take 1,000 mg by mouth 2 (two) times daily with a meal.    . ONETOUCH VERIO test strip 3 (three) times daily.     . pravastatin (PRAVACHOL) 10 MG tablet Take 10 mg by mouth daily.      Current Facility-Administered Medications on File Prior to Visit  Medication Dose Route Frequency Provider Last Rate Last Admin  . triamcinolone acetonide (KENALOG) 10 MG/ML injection 10 mg  10 mg Other Once Landis Martins, DPM        Allergies  Allergen Reactions  . Atorvastatin Other (See Comments)    Cramps   . Escitalopram Oxalate Other (See Comments)    Insomnia, but patient currently takes this  . Codeine Nausea And Vomiting  . Niacin And Related Itching and Rash    Objective: Physical Exam  General: Well developed, nourished, no acute distress, awake, alert and oriented  x 3  Vascular: Dorsalis pedis artery 2/4 bilateral, Posterior tibial artery 1/4 bilateral, skin temperature warm to warm proximal to distal bilateral lower extremities, no varicosities, pedal hair present bilateral.  Trace edema bilateral.  Neurological: Gross sensation present via light touch bilateral. Protective intact bilateral. Vibratory diminished bilateral. Subjective burning to toes bilateral on Lyrica like before.   Dermatological: Skin is warm, dry, and supple bilateral, Nails 1-10 are elonagted thick, and discolored with mild subungal debris with most involved nails bilateral hallux with no acute ingrowing noted, no webspace macerations present  bilateral, no open lesions present bilateral, no callus/corns/hyperkeratotic tissue present bilateral. No signs of infection bilateral.  Musculoskeletal: No symptomatic boney deformities noted bilateral. Muscular strength within normal limits without pain on range of motion. No pain with calf compression bilateral.   Assessment and Plan:  Problem List Items Addressed This Visit    None    Visit Diagnoses    Pain due to onychomycosis of toenails of both feet    -  Primary   Diabetic polyneuropathy associated with type 2 diabetes mellitus ([place])         -Examined patient -Re-discussed foot care in the setting of diabetes -Nails x10 mechanically debrided with sterile nail nipper without incident   -Continue with PCP follow up in medical management for neuropathy and trace edema -Patient to return in  2.5-3 months for follow up evaluation/Diabetic nail care or sooner if symptoms worsen.  Landis Martins, DPM

## 2020-02-19 ENCOUNTER — Ambulatory Visit: Payer: Medicare Other | Admitting: Sports Medicine

## 2020-02-21 ENCOUNTER — Other Ambulatory Visit: Payer: Self-pay | Admitting: Sports Medicine

## 2020-03-04 ENCOUNTER — Ambulatory Visit (INDEPENDENT_AMBULATORY_CARE_PROVIDER_SITE_OTHER): Payer: Medicare Other | Admitting: Sports Medicine

## 2020-03-04 ENCOUNTER — Other Ambulatory Visit: Payer: Self-pay

## 2020-03-04 ENCOUNTER — Encounter: Payer: Self-pay | Admitting: Sports Medicine

## 2020-03-04 VITALS — Temp 97.5°F

## 2020-03-04 DIAGNOSIS — E1142 Type 2 diabetes mellitus with diabetic polyneuropathy: Secondary | ICD-10-CM

## 2020-03-04 DIAGNOSIS — B351 Tinea unguium: Secondary | ICD-10-CM | POA: Diagnosis not present

## 2020-03-04 DIAGNOSIS — M79675 Pain in left toe(s): Secondary | ICD-10-CM

## 2020-03-04 DIAGNOSIS — M79674 Pain in right toe(s): Secondary | ICD-10-CM

## 2020-03-04 NOTE — Progress Notes (Signed)
Patient ID: Deborah Weeks, female   DOB: 09-Apr-1946, 74 y.o.   MRN: [id number] Subjective: Deborah Weeks is a 74 y.o. female patient seen today in office for diabetic foot check and nail trim.  Reports that her blood sugar was not recorded and reports no changes of medication since last encounter, No other pedal complaints noted.   Went to grandson movie party on Saturday and had popcorn and slushies.   Patient Active Problem List   Diagnosis Date Noted  . HLD (hyperlipidemia)   . Abnormal urinalysis 10/15/2018  . Hypokalemia 10/15/2018  . Chest pain 10/15/2018  . Acute respiratory failure with hypoxia ([place]) 10/14/2018  . Recurrent major depressive disorder, in full remission ([place]) 10/28/2014  . Multinodular goiter 08/30/2012  . Allergic rhinitis 05/30/2012  . DM (diabetes mellitus) ([place]) 01/05/2011  . Controlled type 2 diabetes mellitus without complication ([place]) 40/07/2724  . Hypertension 01/04/2011  . Osteopenia 12/07/2010  . Glaucoma 02/27/2010  . 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 Systemic lupus erythematosus ([place]) 02/27/2010    Current Outpatient Medications on File Prior to Visit  Medication Sig Dispense Refill  . diclofenac (VOLTAREN) 75 MG EC tablet TAKE 1 TABLET BY MOUTH TWICE A DAY 90 tablet 5  . acetaminophen (TYLENOL) 325 MG tablet Take 2 tablets (650 mg total) by mouth every 6 (six) hours as needed for mild pain or headache (fever >/= 101).    Marland Kitchen albuterol (PROVENTIL HFA;VENTOLIN HFA) 108 (90 Base) MCG/ACT inhaler Inhale 2 puffs every 4 (four) hours as needed into the lungs for wheezing or shortness of breath. 1 Inhaler 0  . amLODipine (NORVASC) 10 MG tablet Take 1 tablet (10 mg total) by mouth daily. 30 tablet 0  . aspirin EC 81 MG tablet Take 81 mg by mouth daily.    . Blood Glucose Monitoring Suppl (ONETOUCH VERIO FLEX SYSTEM) w/Device KIT 2 (two) times daily.     . carvedilol (COREG) 25 MG tablet Take 25 mg by mouth 2 (two) times daily.      . furosemide (LASIX) 20 MG tablet Take 1 tablet (20 mg total) by mouth 2 (two) times daily. 180 tablet 3  . Lancets (ONETOUCH DELICA PLUS [id number]) MISC 3 (three) times daily.     Marland Kitchen linagliptin (TRADJENTA) 5 MG TABS tablet Take 1 tablet (5 mg total) by mouth daily. 30 tablet 1  . LYRICA 75 MG capsule Take 75 mg by mouth 3 (three) times daily.  1  . metFORMIN (GLUCOPHAGE) 500 MG tablet Take 1,000 mg by mouth 2 (two) times daily with a meal.    . ONETOUCH VERIO test strip 3 (three) times daily.     . pravastatin (PRAVACHOL) 10 MG tablet Take 10 mg by mouth daily.      Current Facility-Administered Medications on File Prior to Visit  Medication Dose Route Frequency Provider Last Rate Last Admin  . triamcinolone acetonide (KENALOG) 10 MG/ML injection 10 mg  10 mg Other Once Landis Martins, DPM        Allergies  Allergen Reactions  . Atorvastatin Other (See Comments)    Cramps   . Escitalopram Oxalate Other (See Comments)    Insomnia, but patient currently takes this  . Codeine Nausea And Vomiting  . Niacin And Related Itching and Rash    Objective: Physical Exam  General: Well developed, nourished, no acute distress, awake, alert and oriented x 3  Vascular: Dorsalis pedis artery 2/4 bilateral, Posterior tibial artery 1/4 bilateral, skin temperature warm to  warm proximal to distal bilateral lower extremities, no varicosities, pedal hair present bilateral.  Trace edema bilateral.  Neurological: Gross sensation present via light touch bilateral. Protective intact bilateral. Vibratory diminished bilateral. Subjective burning to toes bilateral on Lyrica like before.   Dermatological: Skin is warm, dry, and supple bilateral, Nails 1-10 are elonagted thick, and discolored with mild subungal debris with most involved nails bilateral hallux with no acute ingrowing noted, no webspace macerations present bilateral, no open lesions present bilateral, no callus/corns/hyperkeratotic tissue present  bilateral. No signs of infection bilateral.  Musculoskeletal: No symptomatic boney deformities noted bilateral. Muscular strength within normal limits without pain on range of motion. No pain with calf compression bilateral.   Assessment and Plan:  Problem List Items Addressed This Visit    None    Visit Diagnoses    Pain due to onychomycosis of toenails of both feet    -  Primary   Diabetic polyneuropathy associated with type 2 diabetes mellitus ([place])         -Examined patient -Re-discussed foot care in the setting of diabetes -Nails x10 mechanically debrided with sterile nail nipper without incident   -Patient to return in  2.5-3 months for follow up evaluation/Diabetic nail care or sooner if symptoms worsen.  Landis Martins, DPM

## 2020-04-30 ENCOUNTER — Other Ambulatory Visit: Payer: Self-pay | Admitting: Family Medicine

## 2020-04-30 DIAGNOSIS — Z1231 Encounter for screening mammogram for malignant neoplasm of breast: Secondary | ICD-10-CM

## 2020-05-14 ENCOUNTER — Ambulatory Visit: Payer: Medicare Other | Attending: Family Medicine | Admitting: Physical Therapy

## 2020-05-14 ENCOUNTER — Encounter: Payer: Self-pay | Admitting: Physical Therapy

## 2020-05-14 ENCOUNTER — Other Ambulatory Visit: Payer: Self-pay

## 2020-05-14 DIAGNOSIS — R293 Abnormal posture: Secondary | ICD-10-CM | POA: Diagnosis present

## 2020-05-14 DIAGNOSIS — M25562 Pain in left knee: Secondary | ICD-10-CM | POA: Insufficient documentation

## 2020-05-14 DIAGNOSIS — M25511 Pain in right shoulder: Secondary | ICD-10-CM | POA: Insufficient documentation

## 2020-05-14 DIAGNOSIS — M25611 Stiffness of right shoulder, not elsewhere classified: Secondary | ICD-10-CM | POA: Diagnosis present

## 2020-05-14 DIAGNOSIS — G8929 Other chronic pain: Secondary | ICD-10-CM | POA: Insufficient documentation

## 2020-05-14 NOTE — Therapy (Signed)
[place], [place], [address] Phone: [phone]   Fax:  [phone]  Physical Therapy Evaluation  Patient Details  Name: Deborah Weeks MRN: [id number] Date of Birth: Nov 17, 1945 Referring Provider (PT): Dr. Darlina Sicilian   Encounter Date: 05/14/2020   PT End of Session - 05/14/20 1131    Visit Number 1    Number of Visits 16    Date for PT Re-Evaluation 07/09/20    PT Start Time 1046    PT Stop Time 1132    PT Time Calculation (min) 46 min    Activity Tolerance Patient tolerated treatment well    Behavior During Therapy [facility] for tasks assessed/performed           Past Medical History:  Diagnosis Date  . Arthritis   . Diabetes mellitus without complication ([place])   . High cholesterol   . Hypertension   . Lupus ([place])   . Neuropathy     Past Surgical History:  Procedure Laterality Date  . BACK SURGERY    . LAPAROSCOPIC TUBAL LIGATION      There were no vitals filed for this visit.    Subjective Assessment - 05/14/20 1053    Subjective Patient reports 6 months ago her Rt shoulder locked up.  Her L knee pain is related to arthritis. She would like to work mostly on her Rt shoulder, feels difficult reaching behind her back and overhead  Denies weakness, sensory changes. Her knee limits her ability to stand, especially from a lower chair.    Pertinent History diabetes    Diagnostic tests none    Patient Stated Goals to use my Rt arm better.    Currently in Pain? Yes    Pain Score 0-No pain    Pain Location Knee    Pain Orientation Left    Pain Descriptors / Indicators Aching;Tightness    Multiple Pain Sites Yes    Pain Score 4   none at rest   Pain Location Shoulder    Pain Orientation Right    Pain Descriptors / Indicators Aching;Sore;Tightness    Pain Type Chronic pain    Pain Radiating Towards Rt lower arm    Pain Onset More than a month ago    Pain Frequency Intermittent     Aggravating Factors  lifting, reaching    Pain Relieving Factors rest, med cream, heat    Effect of Pain on Daily Activities Rt arm dominant              [facility] PT Assessment - 05/14/20 0001      Assessment   Medical Diagnosis Rt shoulder pain, L knee pain     Referring Provider (PT) Dr. 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  Leisure helps her sister, likes to walk, friends       Cognition   Overall Cognitive Status Within Functional Limits for tasks assessed      Observation/Other Assessments   Focus on Therapeutic Outcomes (FOTO)  46%      Sensation   Light Touch Appears Intact      Coordination   Gross Motor Movements are Fluid and Coordinated Not tested      Posture/Postural Control   Posture/Postural Control Postural limitations      AROM   Right/Left Shoulder --   ER/IR measured at 90 deg abd    Right Shoulder Extension 38 Degrees    Right Shoulder Flexion 105 Degrees   pain    Right Shoulder ABduction 80 Degrees    Right Shoulder Internal Rotation 55 Degrees   FR Rt hip pain    Right Shoulder External Rotation 38 Degrees   FR to back of head, lacks ER    Right Knee Flexion 130    Left Knee Flexion 130      PROM   Overall PROM Comments flexion to  130, abduction 100      Strength   Right Shoulder Flexion 4/5    Right Shoulder ABduction 4/5    Right Shoulder Internal Rotation 5/5    Right Shoulder External Rotation 5/5    Left Shoulder  Flexion 4/5    Left Shoulder ABduction 4/5    Right Hand Grip (lbs) 59    Left Hand Grip (lbs) 33     Right Knee Flexion 5/5    Right Knee Extension 5/5    Left Knee Flexion 5/5    Left Knee Extension 5/5      Palpation   Palpation comment min pain with palpation to anterior Rt shoulder, tender distal into posterior upper arm       Transfers   Five time sit to stand comments  21.26 5 x STS               Objective measurements completed on examination: See above findings.       PT Education - 05/14/20 2013    Education Details PT/POC, eval findings, HEP    Person(s) Educated Patient    Methods Explanation;Demonstration;Verbal cues;Handout    Comprehension Verbalized understanding;Returned demonstration            PT Short Term Goals - 05/14/20 2013      PT SHORT TERM GOAL #1   Title Pt will be I with HEP for Rt shoulder ROM and strength    Time 4    Period Weeks    Status New    Target Date 06/11/20      PT SHORT TERM GOAL #2   Title Pt will be able to reach Rt UE to central lumbar area for progress towards ADLs, min increase in pain .    Time 4    Period Weeks    Status New    Target Date 06/11/20      PT SHORT TERM GOAL #3   Title Pt will be able to reach to shoulder height with light object and min discomfort in Rt UE    Time 4    Period Weeks    Status New    Target Date 06/11/20      PT SHORT TERM GOAL #4   Title Pt will understand FOTO results for Rt UE and potential to improve condition.    Time 4    Period  Weeks    Status New    Target Date 06/11/20             PT Long Term Goals - 05/14/20 2018      PT LONG TERM GOAL #1   Title Pt will improve FOTO score by 10 % or more to demo improved Shoulder mobility.    Time 8    Period Weeks    Status New    Target Date 07/09/20      PT LONG TERM GOAL #2   Title Pt will be able to improve sit to stand x 5 time to 13 sec or less    Time 8    Period Weeks    Status New    Target Date  07/09/20      PT LONG TERM GOAL #3   Title Pt will be able to reach Rt UE to 130 deg or more to retrieve a light object from a high cabinet.    Time 8    Period Weeks    Status New    Target Date 07/09/20      PT LONG TERM GOAL #4   Title Pt will be able to reach to back of neck and behind her back with no increase in pain    Time 8    Period Weeks    Status New    Target Date 07/09/20      PT LONG TERM GOAL #5   Title Pt will be I with more advanced HEP upong discharge for shoulder and knees    Time 8    Period Weeks    Status New    Target Date 07/09/20                  Plan - 05/14/20 2022    Clinical Impression Statement Patient presents for low complexity eval of Rt UE pain as well as Lt knee pain. Her priority is with her Rt shoulder as it is more painful and limiting in function.  She presents with signs and symptoms of adhesive capsulitis. She has decent strength but stiffness significant in all planes with pain. She has poor posture and abnormal shoulder mechanics.  She will benefit from skilled PT to mobilize shoulder, gain functional mobility in UE and LE and allow her to remain active and healthy.    Personal Factors and Comorbidities Comorbidity 1;Comorbidity 2    Comorbidities lupus, diabetes    Examination-Activity Limitations Bathing;Reach Overhead;Bed Mobility;Dressing;Hygiene/Grooming;Caring for Others;Carry;Transfers;Lift;Sleep    Examination-Participation Restrictions Church;Interpersonal Relationship;Volunteer;Occupation;Cleaning;Laundry;Community Activity;Shop    Stability/Clinical Decision Making Stable/Uncomplicated    Clinical Decision Making Low    Rehab Potential Excellent    PT Frequency 2x / week    PT Duration 8 weeks    PT Treatment/Interventions ADLs/Self Care Home Management;Electrical Stimulation;Moist Heat;Ultrasound;Cryotherapy;Iontophoresis 4mg /ml Dexamethasone;Functional mobility training;Therapeutic exercise;Manual  techniques;Taping;Dry needling;Passive range of motion;Patient/family education    PT Next Visit Plan check HEP, UBE, manual , consider ionto    PT Home Exercise Plan ER/IR stretching (sleeper, seated, towel) and AAROM flexion    Consulted and Agree with Plan of Care Patient           Patient will benefit from skilled therapeutic intervention in order to improve the following deficits and impairments:  Decreased range of motion, Decreased mobility, Decreased strength, Postural dysfunction, Impaired flexibility, Increased fascial restricitons, Hypomobility, Impaired UE functional use, Pain, Improper body mechanics  Visit Diagnosis: Acute pain of right shoulder  Stiffness of right shoulder, not  elsewhere classified  Chronic pain of left knee  Abnormal posture     Problem List Patient Active Problem List   Diagnosis Date Noted  . HLD (hyperlipidemia)   . Abnormal urinalysis 10/15/2018  . Hypokalemia 10/15/2018  . Chest pain 10/15/2018  . Acute respiratory failure with hypoxia ([place]) 10/14/2018  . Recurrent major depressive disorder, in full remission ([place]) 10/28/2014  . Multinodular goiter 08/30/2012  . Allergic rhinitis 05/30/2012  . DM (diabetes mellitus) ([place]) 01/05/2011  . Controlled type 2 diabetes mellitus without complication ([place]) 78/29/5621  . Hypertension 01/04/2011  . Osteopenia 12/07/2010  . Glaucoma 02/27/2010  . 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 Systemic lupus erythematosus ([place]) 02/27/2010    Pamalee Marcoe 05/14/2020, 8:36 PM  [facility] [address] [place], [place], [address] Phone: [phone]   Fax:  [phone]  Name: Deborah Weeks MRN: [id number] Date of Birth: 1945-11-29   Raeford Razor, PT 05/14/20 8:37 PM Phone: [phone] Fax: [phone]

## 2020-05-14 NOTE — Patient Instructions (Signed)
Sleeper stretch Internal rotation stretch in standing with towel Seated external rotation Wall slides for AAROM flexion [facility] handout given  Raeford Razor, PT 05/14/20 8:12 PM Phone: [phone] Fax: [phone]

## 2020-05-21 ENCOUNTER — Ambulatory Visit: Payer: Medicare Other | Admitting: Physical Therapy

## 2020-05-21 ENCOUNTER — Other Ambulatory Visit: Payer: Self-pay

## 2020-05-21 ENCOUNTER — Encounter: Payer: Self-pay | Admitting: Physical Therapy

## 2020-05-21 DIAGNOSIS — M25511 Pain in right shoulder: Secondary | ICD-10-CM

## 2020-05-21 DIAGNOSIS — G8929 Other chronic pain: Secondary | ICD-10-CM

## 2020-05-21 DIAGNOSIS — M25562 Pain in left knee: Secondary | ICD-10-CM

## 2020-05-21 DIAGNOSIS — M25611 Stiffness of right shoulder, not elsewhere classified: Secondary | ICD-10-CM

## 2020-05-21 DIAGNOSIS — R293 Abnormal posture: Secondary | ICD-10-CM

## 2020-05-21 NOTE — Therapy (Signed)
[place], [place], [address] Phone: [phone]   Fax:  [phone]  Physical Therapy Treatment  Patient Details  Name: Deborah Weeks MRN: [id number] Date of Birth: 1946/07/13 Referring Provider (PT): Dr. Darlina Sicilian   Encounter Date: 05/21/2020   PT End of Session - 05/21/20 1632    Visit Number 2    Number of Visits 16    Date for PT Re-Evaluation 07/09/20    PT Start Time 5093    PT Stop Time 1710    PT Time Calculation (min) 39 min    Activity Tolerance Patient tolerated treatment well    Behavior During Therapy [facility] for tasks assessed/performed           Past Medical History:  Diagnosis Date  . Arthritis   . Diabetes mellitus without complication ([place])   . High cholesterol   . Hypertension   . Lupus ([place])   . Neuropathy     Past Surgical History:  Procedure Laterality Date  . BACK SURGERY    . LAPAROSCOPIC TUBAL LIGATION      There were no vitals filed for this visit.   Subjective Assessment - 05/21/20 1633    Subjective Points to [facility] joint with complaints of a little pain.    Patient Stated Goals to use my Rt arm better.    Currently in Pain? No/denies                             [facility] Adult PT Treatment/Exercise - 05/21/20 0001      Exercises   Exercises Shoulder      Shoulder Exercises: Standing   Other Standing Exercises wall ladder x6      Shoulder Exercises: Pulleys   Flexion 3 minutes      Modalities   Modalities Ultrasound      Ultrasound   Ultrasound Location Rt Ac joint    Ultrasound Parameters .8 w/cm2 cont    Ultrasound Goals Pain      Manual Therapy   Manual Therapy Passive ROM;Manual Traction;Soft tissue mobilization    Soft tissue mobilization Rt upper trap & cervical paraspinals    Passive ROM GHJ all directions    Manual Traction cervical                    PT Short Term Goals - 05/14/20 2013      PT  SHORT TERM GOAL #1   Title Pt will be I with HEP for Rt shoulder ROM and strength    Time 4    Period Weeks    Status New    Target Date 06/11/20      PT SHORT TERM GOAL #2   Title Pt will be able to reach Rt UE to central lumbar area for progress towards ADLs, min increase in pain .    Time 4    Period Weeks    Status New    Target Date 06/11/20      PT SHORT TERM GOAL #3   Title Pt will be able to reach to shoulder height with light object and min discomfort in Rt UE    Time 4    Period Weeks    Status New    Target Date 06/11/20      PT SHORT TERM GOAL #4   Title Pt will understand FOTO results for Rt UE and potential to improve condition.  Time 4    Period Weeks    Status New    Target Date 06/11/20             PT Long Term Goals - 05/14/20 2018      PT LONG TERM GOAL #1   Title Pt will improve FOTO score by 10 % or more to demo improved Shoulder mobility.    Time 8    Period Weeks    Status New    Target Date 07/09/20      PT LONG TERM GOAL #2   Title Pt will be able to improve sit to stand x 5 time to 13 sec or less    Time 8    Period Weeks    Status New    Target Date 07/09/20      PT LONG TERM GOAL #3   Title Pt will be able to reach Rt UE to 130 deg or more to retrieve a light object from a high cabinet.    Time 8    Period Weeks    Status New    Target Date 07/09/20      PT LONG TERM GOAL #4   Title Pt will be able to reach to back of neck and behind her back with no increase in pain    Time 8    Period Weeks    Status New    Target Date 07/09/20      PT LONG TERM GOAL #5   Title Pt will be I with more advanced HEP upong discharge for shoulder and knees    Time 8    Period Weeks    Status New    Target Date 07/09/20                 Plan - 05/21/20 1710    Clinical Impression Statement tightness noted in upper trap as result of shrugging- she will work on this at home in a [employer]. Has a hard time relaxing for PROM but will  continue to work on this because she would benefit from cervical traction & stretching as well.    PT Treatment/Interventions ADLs/Self Care Home Management;Electrical Stimulation;Moist Heat;Ultrasound;Cryotherapy;Iontophoresis 4mg /ml Dexamethasone;Functional mobility training;Therapeutic exercise;Manual techniques;Taping;Dry needling;Passive range of motion;Patient/family education    PT Next Visit Plan continue manual, UBE, outcome of [place]?    PT Home Exercise Plan ER/IR stretching (sleeper, seated, towel) and AAROM flexion    Consulted and Agree with Plan of Care Patient           Patient will benefit from skilled therapeutic intervention in order to improve the following deficits and impairments:  Decreased range of motion, Decreased mobility, Decreased strength, Postural dysfunction, Impaired flexibility, Increased fascial restricitons, Hypomobility, Impaired UE functional use, Pain, Improper body mechanics  Visit Diagnosis: Acute pain of right shoulder  Stiffness of right shoulder, not elsewhere classified  Chronic pain of left knee  Abnormal posture     Problem List Patient Active Problem List   Diagnosis Date Noted  . HLD (hyperlipidemia)   . Abnormal urinalysis 10/15/2018  . Hypokalemia 10/15/2018  . Chest pain 10/15/2018  . Acute respiratory failure with hypoxia ([place]) 10/14/2018  . Recurrent major depressive disorder, in full remission ([place]) 10/28/2014  . Multinodular goiter 08/30/2012  . Allergic rhinitis 05/30/2012  . DM (diabetes mellitus) ([place]) 01/05/2011  . Controlled type 2 diabetes mellitus without complication ([place]) 58/06/9832  . Hypertension 01/04/2011  . Osteopenia 12/07/2010  . Glaucoma 02/27/2010  . History of  neoplasm of bladder 02/27/2010  . Systemic lupus erythematosus ([place]) 02/27/2010    Deborah Weeks PT, [name] 05/21/20 5:12 PM   [facility] [address] [place], [place], [address] Phone:  [phone]   Fax:  [phone]  Name: Deborah Weeks MRN: [id number] Date of Birth: 12/01/1945

## 2020-05-28 ENCOUNTER — Encounter: Payer: Self-pay | Admitting: Physical Therapy

## 2020-05-28 ENCOUNTER — Other Ambulatory Visit: Payer: Self-pay

## 2020-05-28 ENCOUNTER — Ambulatory Visit: Payer: Medicare Other | Admitting: Physical Therapy

## 2020-05-28 DIAGNOSIS — M25511 Pain in right shoulder: Secondary | ICD-10-CM

## 2020-05-28 DIAGNOSIS — M25611 Stiffness of right shoulder, not elsewhere classified: Secondary | ICD-10-CM

## 2020-05-28 NOTE — Therapy (Signed)
[place], [place], [address] Phone: [phone]   Fax:  [phone]  Physical Therapy Treatment  Patient Details  Name: Deborah Weeks MRN: [id number] Date of Birth: May 11, 1946 Referring Provider (PT): Dr. Darlina Sicilian   Encounter Date: 05/28/2020   PT End of Session - 05/28/20 1802    Visit Number 3    Number of Visits 16    Date for PT Re-Evaluation 07/09/20    PT Start Time 1800    PT Stop Time 1838    PT Time Calculation (min) 38 min    Activity Tolerance Patient tolerated treatment well    Behavior During Therapy [facility] for tasks assessed/performed           Past Medical History:  Diagnosis Date  . Arthritis   . Diabetes mellitus without complication ([place])   . High cholesterol   . Hypertension   . Lupus ([place])   . Neuropathy     Past Surgical History:  Procedure Laterality Date  . BACK SURGERY    . LAPAROSCOPIC TUBAL LIGATION      There were no vitals filed for this visit.   Subjective Assessment - 05/28/20 1802    Subjective It's getting better, I think. Still painful but getting better. ROM is improving. Reaching up and behind my back is painful.    Patient Stated Goals to use my Rt arm better.    Currently in Pain? No/denies              [facility] PT Assessment - 05/28/20 0001      AROM   Right Shoulder Flexion 125 Degrees   mild pain at end range                        [facility] Adult PT Treatment/Exercise - 05/28/20 0001      Shoulder Exercises: Supine   Protraction 15 reps;Right      Shoulder Exercises: Sidelying   Flexion Limitations to 90 with cues for scap protraciton/retraction    ABduction 15 reps    ABduction Limitations full avail range      Shoulder Exercises: Pulleys   Flexion 2 minutes    Scaption 2 minutes      Shoulder Exercises: Stretch   Internal Rotation Stretch Limitations behind back with strap      Ultrasound   Ultrasound  Location --   attempted but created pain into upper trap today     Manual Therapy   Soft tissue mobilization Rt upper trap, infraspinatus, periscap region    Passive ROM scapular mobilty & distraction, rhomboid STM, supine subscap STM                    PT Short Term Goals - 05/14/20 2013      PT SHORT TERM GOAL #1   Title Pt will be I with HEP for Rt shoulder ROM and strength    Time 4    Period Weeks    Status New    Target Date 06/11/20      PT SHORT TERM GOAL #2   Title Pt will be able to reach Rt UE to central lumbar area for progress towards ADLs, min increase in pain .    Time 4    Period Weeks    Status New    Target Date 06/11/20      PT SHORT TERM GOAL #3   Title Pt will be able  to reach to shoulder height with light object and min discomfort in Rt UE    Time 4    Period Weeks    Status New    Target Date 06/11/20      PT SHORT TERM GOAL #4   Title Pt will understand FOTO results for Rt UE and potential to improve condition.    Time 4    Period Weeks    Status New    Target Date 06/11/20             PT Long Term Goals - 05/14/20 2018      PT LONG TERM GOAL #1   Title Pt will improve FOTO score by 10 % or more to demo improved Shoulder mobility.    Time 8    Period Weeks    Status New    Target Date 07/09/20      PT LONG TERM GOAL #2   Title Pt will be able to improve sit to stand x 5 time to 13 sec or less    Time 8    Period Weeks    Status New    Target Date 07/09/20      PT LONG TERM GOAL #3   Title Pt will be able to reach Rt UE to 130 deg or more to retrieve a light object from a high cabinet.    Time 8    Period Weeks    Status New    Target Date 07/09/20      PT LONG TERM GOAL #4   Title Pt will be able to reach to back of neck and behind her back with no increase in pain    Time 8    Period Weeks    Status New    Target Date 07/09/20      PT LONG TERM GOAL #5   Title Pt will be I with more advanced HEP upong  discharge for shoulder and knees    Time 8    Period Weeks    Status New    Target Date 07/09/20                 Plan - 05/28/20 1838    Clinical Impression Statement ROM is improving and she is able to demo less shoulder hike in flexion. ROM incr to 130 after manual therapy today. Asked her to replace sleeper stretch with standing IR behind back with strap and cotninue practicing AROM in mirror. Reported feeling better following treatment.    PT Treatment/Interventions ADLs/Self Care Home Management;Electrical Stimulation;Moist Heat;Ultrasound;Cryotherapy;Iontophoresis 4mg /ml Dexamethasone;Functional mobility training;Therapeutic exercise;Manual techniques;Taping;Dry needling;Passive range of motion;Patient/family education    PT Next Visit Plan continue STM & scap mobility    PT Home Exercise Plan ER/IR stretching (sleeper, seated, towel) and AAROM flexion    Consulted and Agree with Plan of Care Patient           Patient will benefit from skilled therapeutic intervention in order to improve the following deficits and impairments:  Decreased range of motion, Decreased mobility, Decreased strength, Postural dysfunction, Impaired flexibility, Increased fascial restricitons, Hypomobility, Impaired UE functional use, Pain, Improper body mechanics  Visit Diagnosis: Acute pain of right shoulder  Stiffness of right shoulder, not elsewhere classified     Problem List Patient Active Problem List   Diagnosis Date Noted  . HLD (hyperlipidemia)   . Abnormal urinalysis 10/15/2018  . Hypokalemia 10/15/2018  . Chest pain 10/15/2018  . Acute respiratory failure with hypoxia ([place])  10/14/2018  . Recurrent major depressive disorder, in full remission ([place]) 10/28/2014  . Multinodular goiter 08/30/2012  . Allergic rhinitis 05/30/2012  . DM (diabetes mellitus) ([place]) 01/05/2011  . Controlled type 2 diabetes mellitus without complication ([place]) 60/67/7034  . Hypertension 01/04/2011  .  Osteopenia 12/07/2010  . Glaucoma 02/27/2010  . 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 Systemic lupus erythematosus ([place]) 02/27/2010   Milon Dethloff C. Breslin Burklow PT, [name] 05/28/20 6:40 PM   [facility] [address] [place], [place], [address] Phone: [phone]   Fax:  [phone]  Name: Naavya Postma MRN: [id number] Date of Birth: 01-Aug-1946

## 2020-05-30 ENCOUNTER — Encounter: Payer: Self-pay | Admitting: Physical Therapy

## 2020-05-30 ENCOUNTER — Other Ambulatory Visit: Payer: Self-pay

## 2020-05-30 ENCOUNTER — Ambulatory Visit: Payer: Medicare Other | Admitting: Physical Therapy

## 2020-05-30 DIAGNOSIS — M25611 Stiffness of right shoulder, not elsewhere classified: Secondary | ICD-10-CM

## 2020-05-30 DIAGNOSIS — M25511 Pain in right shoulder: Secondary | ICD-10-CM | POA: Diagnosis not present

## 2020-05-30 DIAGNOSIS — G8929 Other chronic pain: Secondary | ICD-10-CM

## 2020-05-30 DIAGNOSIS — R293 Abnormal posture: Secondary | ICD-10-CM

## 2020-05-30 NOTE — Therapy (Signed)
[place], [place], [address] Phone: [phone]   Fax:  [phone]  Physical Therapy Treatment  Patient Details  Name: Deborah Weeks MRN: [id number] Date of Birth: 1946/08/17 Referring Provider (PT): Dr. Darlina Sicilian   Encounter Date: 05/30/2020   PT End of Session - 05/30/20 1320    Visit Number 4    Number of Visits 16    Date for PT Re-Evaluation 07/09/20    PT Start Time 7017    PT Stop Time 7939    PT Time Calculation (min) 43 min           Past Medical History:  Diagnosis Date  . Arthritis   . Diabetes mellitus without complication ([place])   . High cholesterol   . Hypertension   . Lupus ([place])   . Neuropathy     Past Surgical History:  Procedure Laterality Date  . BACK SURGERY    . LAPAROSCOPIC TUBAL LIGATION      There were no vitals filed for this visit.   Subjective Assessment - 05/30/20 1320    Currently in Pain? No/denies                             [facility] Adult PT Treatment/Exercise - 05/30/20 0001      Shoulder Exercises: Supine   Protraction 15 reps;Right    Horizontal ABduction 20 reps    Theraband Level (Shoulder Horizontal ABduction) Level 2 ([place])    External Rotation 20 reps    Theraband Level (Shoulder External Rotation) Level 2 (Red)      Shoulder Exercises: Standing   Extension 20 reps    Theraband Level (Shoulder Extension) Level 2 (Red)    Row 20 reps   cues for form   Theraband Level (Shoulder Row) Level 2 (Red)    Other Standing Exercises AAROM IR UE ranger and wand       Shoulder Exercises: Pulleys   Flexion 2 minutes    Scaption 2 minutes      Shoulder Exercises: ROM/Strengthening   UBE (Upper Arm Bike) L1.5 2 minutes forward and 2 minutes backward       Shoulder Exercises: Stretch   Internal Rotation Stretch Limitations behind back with towel   towel in axilla first to distract    Other Shoulder Stretches levator  stretch     Other Shoulder Stretches upper trap stretch       Manual Therapy   Manual therapy comments Sidelying scap mobilization     Passive ROM GHJ all directions                    PT Short Term Goals - 05/14/20 2013      PT SHORT TERM GOAL #1   Title Pt will be I with HEP for Rt shoulder ROM and strength    Time 4    Period Weeks    Status New    Target Date 06/11/20      PT SHORT TERM GOAL #2   Title Pt will be able to reach Rt UE to central lumbar area for progress towards ADLs, min increase in pain .    Time 4    Period Weeks    Status New    Target Date 06/11/20      PT SHORT TERM GOAL #3   Title Pt will be able to reach to shoulder height with light object and  min discomfort in Rt UE    Time 4    Period Weeks    Status New    Target Date 06/11/20      PT SHORT TERM GOAL #4   Title Pt will understand FOTO results for Rt UE and potential to improve condition.    Time 4    Period Weeks    Status New    Target Date 06/11/20             PT Long Term Goals - 05/14/20 2018      PT LONG TERM GOAL #1   Title Pt will improve FOTO score by 10 % or more to demo improved Shoulder mobility.    Time 8    Period Weeks    Status New    Target Date 07/09/20      PT LONG TERM GOAL #2   Title Pt will be able to improve sit to stand x 5 time to 13 sec or less    Time 8    Period Weeks    Status New    Target Date 07/09/20      PT LONG TERM GOAL #3   Title Pt will be able to reach Rt UE to 130 deg or more to retrieve a light object from a high cabinet.    Time 8    Period Weeks    Status New    Target Date 07/09/20      PT LONG TERM GOAL #4   Title Pt will be able to reach to back of neck and behind her back with no increase in pain    Time 8    Period Weeks    Status New    Target Date 07/09/20      PT LONG TERM GOAL #5   Title Pt will be I with more advanced HEP upong discharge for shoulder and knees    Time 8    Period Weeks    Status New     Target Date 07/09/20                 Plan - 05/30/20 1355    Clinical Impression Statement Pt reports painful knot is mostly gone and does not hurt anymore. Continued with scap mobility and stabilization, Began upper trap and levator stretching.  Continued with work in internal rotation as tolerated.    PT Next Visit Plan continue STM & scap mobility    PT Home Exercise Plan ER/IR stretching (sleeper, seated, towel) and AAROM flexion           Patient will benefit from skilled therapeutic intervention in order to improve the following deficits and impairments:  Decreased range of motion, Decreased mobility, Decreased strength, Postural dysfunction, Impaired flexibility, Increased fascial restricitons, Hypomobility, Impaired UE functional use, Pain, Improper body mechanics  Visit Diagnosis: Acute pain of right shoulder  Stiffness of right shoulder, not elsewhere classified  Chronic pain of left knee  Abnormal posture     Problem List Patient Active Problem List   Diagnosis Date Noted  . HLD (hyperlipidemia)   . Abnormal urinalysis 10/15/2018  . Hypokalemia 10/15/2018  . Chest pain 10/15/2018  . Acute respiratory failure with hypoxia ([place]) 10/14/2018  . Recurrent major depressive disorder, in full remission ([place]) 10/28/2014  . Multinodular goiter 08/30/2012  . Allergic rhinitis 05/30/2012  . DM (diabetes mellitus) ([place]) 01/05/2011  . Controlled type 2 diabetes mellitus without complication ([place]) 62/22/9798  . Hypertension 01/04/2011  .  Osteopenia 12/07/2010  . Glaucoma 02/27/2010  . 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 Systemic lupus erythematosus ([place]) 02/27/2010    Dorene Ar, PTA 05/30/2020, 1:59 PM  [facility] [address] [place], [place], [address] Phone: [phone]   Fax:  [phone]  Name: Laresa Oshiro MRN: [id number] Date of Birth: 1946/01/27

## 2020-06-03 ENCOUNTER — Ambulatory Visit: Payer: Medicare Other | Admitting: Physical Therapy

## 2020-06-03 ENCOUNTER — Other Ambulatory Visit: Payer: Self-pay

## 2020-06-03 ENCOUNTER — Encounter: Payer: Self-pay | Admitting: Physical Therapy

## 2020-06-03 DIAGNOSIS — M25511 Pain in right shoulder: Secondary | ICD-10-CM | POA: Diagnosis not present

## 2020-06-03 DIAGNOSIS — G8929 Other chronic pain: Secondary | ICD-10-CM

## 2020-06-03 DIAGNOSIS — R293 Abnormal posture: Secondary | ICD-10-CM

## 2020-06-03 DIAGNOSIS — M25562 Pain in left knee: Secondary | ICD-10-CM

## 2020-06-03 DIAGNOSIS — M25611 Stiffness of right shoulder, not elsewhere classified: Secondary | ICD-10-CM

## 2020-06-03 NOTE — Therapy (Signed)
[place], [place], [address] Phone: [phone]   Fax:  [phone]  Physical Therapy Treatment  Patient Details  Name: Shalinda Burkholder MRN: [id number] Date of Birth: 1946-09-17 Referring Provider (PT): Dr. Darlina Sicilian   Encounter Date: 06/03/2020   PT End of Session - 06/03/20 1631    Visit Number 5    Number of Visits 16    Date for PT Re-Evaluation 07/09/20    PT Start Time 8676    PT Stop Time 1712    PT Time Calculation (min) 41 min    Activity Tolerance Patient tolerated treatment well    Behavior During Therapy [facility] for tasks assessed/performed           Past Medical History:  Diagnosis Date  . Arthritis   . Diabetes mellitus without complication ([place])   . High cholesterol   . Hypertension   . Lupus ([place])   . Neuropathy     Past Surgical History:  Procedure Laterality Date  . BACK SURGERY    . LAPAROSCOPIC TUBAL LIGATION      There were no vitals filed for this visit.   Subjective Assessment - 06/03/20 1632    Subjective I have more range but reaching behind is still hard.    Currently in Pain? No/denies              [facility] PT Assessment - 06/03/20 0001      AROM   Right Shoulder Flexion 145 Degrees   supine after manual therapy   Right Shoulder Internal Rotation --   L3                        [facility] Adult PT Treatment/Exercise - 06/03/20 0001      Shoulder Exercises: Supine   Protraction AROM;Both;15 reps    Protraction Limitations pillow case bw hands    Flexion 10 reps;AROM    Flexion Limitations holding pillow case bw hands      Shoulder Exercises: Sidelying   Other Sidelying Exercises IR behind bacvk with towel roll under arm      Shoulder Exercises: Standing   Internal Rotation Limitations IR behind back red tband    Other Standing Exercises UE ranger [place] flexion      Shoulder Exercises: Stretch   Internal Rotation Stretch  Limitations behind back with strap- PT assisted in posture      Manual Therapy   Manual Therapy Joint mobilization    Joint Mobilization scapular mobiliztion in seated and Lt sidelying, sup to inf at end range flexion    Soft tissue mobilization bil upper traps, Rt subscap, Rt pecs with stretching    Passive ROM GHJ flexion                    PT Short Term Goals - 05/14/20 2013      PT SHORT TERM GOAL #1   Title Pt will be I with HEP for Rt shoulder ROM and strength    Time 4    Period Weeks    Status New    Target Date 06/11/20      PT SHORT TERM GOAL #2   Title Pt will be able to reach Rt UE to central lumbar area for progress towards ADLs, min increase in pain .    Time 4    Period Weeks    Status New    Target Date 06/11/20  PT SHORT TERM GOAL #3   Title Pt will be able to reach to shoulder height with light object and min discomfort in Rt UE    Time 4    Period Weeks    Status New    Target Date 06/11/20      PT SHORT TERM GOAL #4   Title Pt will understand FOTO results for Rt UE and potential to improve condition.    Time 4    Period Weeks    Status New    Target Date 06/11/20             PT Long Term Goals - 05/14/20 2018      PT LONG TERM GOAL #1   Title Pt will improve FOTO score by 10 % or more to demo improved Shoulder mobility.    Time 8    Period Weeks    Status New    Target Date 07/09/20      PT LONG TERM GOAL #2   Title Pt will be able to improve sit to stand x 5 time to 13 sec or less    Time 8    Period Weeks    Status New    Target Date 07/09/20      PT LONG TERM GOAL #3   Title Pt will be able to reach Rt UE to 130 deg or more to retrieve a light object from a high cabinet.    Time 8    Period Weeks    Status New    Target Date 07/09/20      PT LONG TERM GOAL #4   Title Pt will be able to reach to back of neck and behind her back with no increase in pain    Time 8    Period Weeks    Status New    Target Date  07/09/20      PT LONG TERM GOAL #5   Title Pt will be I with more advanced HEP upong discharge for shoulder and knees    Time 8    Period Weeks    Status New    Target Date 07/09/20                 Plan - 06/03/20 1634    Clinical Impression Statement IR improving with manual therapy but required tactile cuing for postural alignment to decrease impingement. Lacking scapular mobility in Rt vs Lt- unable to distract today.    PT Treatment/Interventions ADLs/Self Care Home Management;Electrical Stimulation;Moist Heat;Ultrasound;Cryotherapy;Iontophoresis 4mg /ml Dexamethasone;Functional mobility training;Therapeutic exercise;Manual techniques;Taping;Dry needling;Passive range of motion;Patient/family education    PT Next Visit Plan 6th visit FOTO, manual-scapular distraction, IR stretching    PT Home Exercise Plan ER/IR stretching (sleeper, seated, towel) and AAROM flexion, supine protraction    Consulted and Agree with Plan of Care Patient           Patient will benefit from skilled therapeutic intervention in order to improve the following deficits and impairments:  Decreased range of motion, Decreased mobility, Decreased strength, Postural dysfunction, Impaired flexibility, Increased fascial restricitons, Hypomobility, Impaired UE functional use, Pain, Improper body mechanics  Visit Diagnosis: Acute pain of right shoulder  Stiffness of right shoulder, not elsewhere classified  Chronic pain of left knee  Abnormal posture     Problem List Patient Active Problem List   Diagnosis Date Noted  . HLD (hyperlipidemia)   . Abnormal urinalysis 10/15/2018  . Hypokalemia 10/15/2018  . Chest pain 10/15/2018  . Acute  respiratory failure with hypoxia ([place]) 10/14/2018  . Recurrent major depressive disorder, in full remission ([place]) 10/28/2014  . Multinodular goiter 08/30/2012  . Allergic rhinitis 05/30/2012  . DM (diabetes mellitus) ([place]) 01/05/2011  . Controlled type 2 diabetes  mellitus without complication ([place]) 28/20/8138  . Hypertension 01/04/2011  . Osteopenia 12/07/2010  . Glaucoma 02/27/2010  . 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 Systemic lupus erythematosus ([place]) 02/27/2010   Colbie Sliker C. Dov Dill PT, [name] 06/03/20 5:15 PM   [facility] [address] [place], [place], [address] Phone: [phone]   Fax:  [phone]  Name: Chaitra Mast MRN: [id number] Date of Birth: [birth year]

## 2020-06-04 ENCOUNTER — Other Ambulatory Visit: Payer: Self-pay

## 2020-06-04 ENCOUNTER — Ambulatory Visit (HOSPITAL_COMMUNITY)
Admission: EM | Admit: 2020-06-04 | Discharge: 2020-06-04 | Disposition: A | Discharge status: Home / Self Care | Payer: Medicare Other | Attending: Urgent Care | Admitting: Urgent Care

## 2020-06-04 DIAGNOSIS — Z20822 Contact with and (suspected) exposure to covid-19: Secondary | ICD-10-CM | POA: Diagnosis not present

## 2020-06-04 LAB — SARS CORONAVIRUS 2 (TAT 6-24 HRS): SARS Coronavirus 2: NEGATIVE

## 2020-06-04 NOTE — Discharge Instructions (Signed)

## 2020-06-04 NOTE — ED Triage Notes (Signed)
Pt here for COVID testing, exposed last week

## 2020-06-05 ENCOUNTER — Ambulatory Visit: Payer: Medicare Other | Admitting: Physical Therapy

## 2020-06-05 ENCOUNTER — Ambulatory Visit: Payer: Medicare Other | Admitting: Sports Medicine

## 2020-06-10 ENCOUNTER — Ambulatory Visit: Payer: Medicare Other | Admitting: Physical Therapy

## 2020-06-10 ENCOUNTER — Encounter: Payer: Self-pay | Admitting: Physical Therapy

## 2020-06-10 ENCOUNTER — Other Ambulatory Visit: Payer: Self-pay

## 2020-06-10 DIAGNOSIS — R293 Abnormal posture: Secondary | ICD-10-CM

## 2020-06-10 DIAGNOSIS — M25511 Pain in right shoulder: Secondary | ICD-10-CM | POA: Diagnosis not present

## 2020-06-10 DIAGNOSIS — G8929 Other chronic pain: Secondary | ICD-10-CM

## 2020-06-10 DIAGNOSIS — M25611 Stiffness of right shoulder, not elsewhere classified: Secondary | ICD-10-CM

## 2020-06-10 NOTE — Therapy (Signed)
[place], [place], [address] Phone: [phone]   Fax:  [phone]  Physical Therapy Treatment  Patient Details  Name: Deborah Weeks MRN: [id number] Date of Birth: March 24, 1946 Referring Provider (PT): Dr. Darlina Sicilian   Encounter Date: 06/10/2020   PT End of Session - 06/10/20 1630    Visit Number 6    Number of Visits 16    Date for PT Re-Evaluation 07/09/20    PT Start Time 1630    PT Stop Time 1710    PT Time Calculation (min) 40 min    Activity Tolerance Patient tolerated treatment well    Behavior During Therapy [facility] for tasks assessed/performed           Past Medical History:  Diagnosis Date  . Arthritis   . Diabetes mellitus without complication ([place])   . High cholesterol   . Hypertension   . Lupus ([place])   . Neuropathy     Past Surgical History:  Procedure Laterality Date  . BACK SURGERY    . LAPAROSCOPIC TUBAL LIGATION      There were no vitals filed for this visit.   Subjective Assessment - 06/10/20 1634    Subjective I think it hurts a little extra because the storm is coming. It would probably be better if I did the stretches like I am supposed to.    Patient Stated Goals to use my Rt arm better.    Currently in Pain? Yes    Pain Score 1     Pain Location Shoulder    Pain Orientation Right    Pain Descriptors / Indicators Dull;Throbbing    Aggravating Factors  reaching behind my back    Pain Relieving Factors rest              [facility] PT Assessment - 06/10/20 0001      Observation/Other Assessments   Focus on Therapeutic Outcomes (FOTO)  40% limited      AROM   Right Shoulder Flexion 135 Degrees   pre treatment                        [facility] Adult PT Treatment/Exercise - 06/10/20 0001      Shoulder Exercises: Supine   Protraction Limitations at end range flexion    Flexion 10 reps;Both    Shoulder Flexion Weight (lbs) 1lb in Rt hand     Other Supine Exercises scap retraction with tactile cues      Shoulder Exercises: Sidelying   Other Sidelying Exercises horiz abd 1lb at 90 flexion    Other Sidelying Exercises sidelying resisted scap retraction      Shoulder Exercises: ROM/Strengthening   UBE (Upper Arm Bike) L2 2/2      Shoulder Exercises: Stretch   Other Shoulder Stretches sidelyiing IR towel under arm & 1lb in hand      Manual Therapy   Joint Mobilization scapular mobs, end range flexion sup to inf mobs    Soft tissue mobilization peri scap- Rt side; Rt subscap    Passive ROM GHJ flexion, abduction                  PT Education - 06/10/20 1638    Education Details FOTO            PT Short Term Goals - 06/10/20 1639      PT SHORT TERM GOAL #1   Title Pt will be I with  HEP for Rt shoulder ROM and strength    Status Achieved      PT SHORT TERM GOAL #2   Title Pt will be able to reach Rt UE to central lumbar area for progress towards ADLs, min increase in pain .    Status Achieved      PT SHORT TERM GOAL #3   Title Pt will be able to reach to shoulder height with light object and min discomfort in Rt UE    Status Achieved      PT SHORT TERM GOAL #4   Title Pt will understand FOTO results for Rt UE and potential to improve condition.    Status Achieved             PT Long Term Goals - 05/14/20 2018      PT LONG TERM GOAL #1   Title Pt will improve FOTO score by 10 % or more to demo improved Shoulder mobility.    Time 8    Period Weeks    Status New    Target Date 07/09/20      PT LONG TERM GOAL #2   Title Pt will be able to improve sit to stand x 5 time to 13 sec or less    Time 8    Period Weeks    Status New    Target Date 07/09/20      PT LONG TERM GOAL #3   Title Pt will be able to reach Rt UE to 130 deg or more to retrieve a light object from a high cabinet.    Time 8    Period Weeks    Status New    Target Date 07/09/20      PT LONG TERM GOAL #4   Title Pt will be  able to reach to back of neck and behind her back with no increase in pain    Time 8    Period Weeks    Status New    Target Date 07/09/20      PT LONG TERM GOAL #5   Title Pt will be I with more advanced HEP upong discharge for shoulder and knees    Time 8    Period Weeks    Status New    Target Date 07/09/20                 Plan - 06/10/20 1711    Clinical Impression Statement Pt is demonstrating carryover and has improved in FOTO functional score. IR behind back is still limited. She required verbal and tactile cues for scapular retraction motion today rather than maintaining a protracted point creating an impingement on the post capsule. reported feeling better after treatment today.    PT Treatment/Interventions ADLs/Self Care Home Management;Electrical Stimulation;Moist Heat;Ultrasound;Cryotherapy;Iontophoresis 4mg /ml Dexamethasone;Functional mobility training;Therapeutic exercise;Manual techniques;Taping;Dry needling;Passive range of motion;Patient/family education    PT Next Visit Plan manual PRN, cont IR stretching, review scapular retraction    PT Home Exercise Plan ER/IR stretching (sleeper, seated, towel) and AAROM flexion, supine protraction    Consulted and Agree with Plan of Care Patient           Patient will benefit from skilled therapeutic intervention in order to improve the following deficits and impairments:  Decreased range of motion, Decreased mobility, Decreased strength, Postural dysfunction, Impaired flexibility, Increased fascial restricitons, Hypomobility, Impaired UE functional use, Pain, Improper body mechanics  Visit Diagnosis: Acute pain of right shoulder  Stiffness of right shoulder, not elsewhere classified  Chronic pain of left knee  Abnormal posture     Problem List Patient Active Problem List   Diagnosis Date Noted  . HLD (hyperlipidemia)   . Abnormal urinalysis 10/15/2018  . Hypokalemia 10/15/2018  . Chest pain 10/15/2018  .  Acute respiratory failure with hypoxia ([place]) 10/14/2018  . Recurrent major depressive disorder, in full remission ([place]) 10/28/2014  . Multinodular goiter 08/30/2012  . Allergic rhinitis 05/30/2012  . DM (diabetes mellitus) ([place]) 01/05/2011  . Controlled type 2 diabetes mellitus without complication ([place]) 53/64/6803  . Hypertension 01/04/2011  . Osteopenia 12/07/2010  . Glaucoma 02/27/2010  . 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 Systemic lupus erythematosus ([place]) 02/27/2010    Kaniah Rizzolo C. Jerald Villalona PT, [name] 06/10/20 5:13 PM   [place], [place], [address] Phone: [phone]   Fax:  [phone]  Name: Lera Gaines MRN: [id number] Date of Birth: 11-Jul-1946

## 2020-06-12 ENCOUNTER — Ambulatory Visit: Payer: Medicare Other | Attending: Family Medicine | Admitting: Physical Therapy

## 2020-06-12 ENCOUNTER — Encounter: Payer: Self-pay | Admitting: Physical Therapy

## 2020-06-12 ENCOUNTER — Other Ambulatory Visit: Payer: Self-pay

## 2020-06-12 DIAGNOSIS — M25562 Pain in left knee: Secondary | ICD-10-CM | POA: Insufficient documentation

## 2020-06-12 DIAGNOSIS — M25511 Pain in right shoulder: Secondary | ICD-10-CM | POA: Diagnosis present

## 2020-06-12 DIAGNOSIS — G8929 Other chronic pain: Secondary | ICD-10-CM | POA: Insufficient documentation

## 2020-06-12 DIAGNOSIS — M25611 Stiffness of right shoulder, not elsewhere classified: Secondary | ICD-10-CM | POA: Diagnosis present

## 2020-06-12 DIAGNOSIS — R293 Abnormal posture: Secondary | ICD-10-CM | POA: Diagnosis present

## 2020-06-12 NOTE — Therapy (Signed)
[place], [place], [address] Phone: [phone]   Fax:  [phone]  Physical Therapy Treatment  Patient Details  Name: Deborah Weeks MRN: [id number] Date of Birth: 09/22/1946 Referring Provider (PT): Dr. Darlina Sicilian   Encounter Date: 06/12/2020   PT End of Session - 06/12/20 1552    Visit Number 7    Number of Visits 16    Date for PT Re-Evaluation 07/09/20    PT Start Time 1550    PT Stop Time 1618    PT Time Calculation (min) 28 min    Activity Tolerance Patient tolerated treatment well    Behavior During Therapy [facility] for tasks assessed/performed           Past Medical History:  Diagnosis Date  . Arthritis   . Diabetes mellitus without complication ([place])   . High cholesterol   . Hypertension   . Lupus ([place])   . Neuropathy     Past Surgical History:  Procedure Laterality Date  . BACK SURGERY    . LAPAROSCOPIC TUBAL LIGATION      There were no vitals filed for this visit.   Subjective Assessment - 06/12/20 1551    Subjective Not in any pain, just when I reach up.    Patient Stated Goals to use my Rt arm better.    Currently in Pain? No/denies                             [facility] Adult PT Treatment/Exercise - 06/12/20 0001      Shoulder Exercises: Seated   External Rotation Both    Theraband Level (Shoulder External Rotation) Level 2 (Red)    Flexion Limitations short arc punches to ceiling, 1lb in Rt hand    ABduction Limitations punch to 90 deg 1lb in Rt hand    Other Seated Exercises biceps curls supinated & neutral x15 each    Other Seated Exercises triceps kicks 3lb      Shoulder Exercises: Standing   Other Standing Exercises flexion to liftoff bil UE     Other Standing Exercises Rt UE ABCs on wall                    PT Short Term Goals - 06/10/20 1639      PT SHORT TERM GOAL #1   Title Pt will be I with HEP for Rt shoulder ROM and  strength    Status Achieved      PT SHORT TERM GOAL #2   Title Pt will be able to reach Rt UE to central lumbar area for progress towards ADLs, min increase in pain .    Status Achieved      PT SHORT TERM GOAL #3   Title Pt will be able to reach to shoulder height with light object and min discomfort in Rt UE    Status Achieved      PT SHORT TERM GOAL #4   Title Pt will understand FOTO results for Rt UE and potential to improve condition.    Status Achieved             PT Long Term Goals - 05/14/20 2018      PT LONG TERM GOAL #1   Title Pt will improve FOTO score by 10 % or more to demo improved Shoulder mobility.    Time 8    Period Weeks  Status New    Target Date 07/09/20      PT LONG TERM GOAL #2   Title Pt will be able to improve sit to stand x 5 time to 13 sec or less    Time 8    Period Weeks    Status New    Target Date 07/09/20      PT LONG TERM GOAL #3   Title Pt will be able to reach Rt UE to 130 deg or more to retrieve a light object from a high cabinet.    Time 8    Period Weeks    Status New    Target Date 07/09/20      PT LONG TERM GOAL #4   Title Pt will be able to reach to back of neck and behind her back with no increase in pain    Time 8    Period Weeks    Status New    Target Date 07/09/20      PT LONG TERM GOAL #5   Title Pt will be I with more advanced HEP upong discharge for shoulder and knees    Time 8    Period Weeks    Status New    Target Date 07/09/20                 Plan - 06/12/20 1618    Clinical Impression Statement Worked in a seated position with hand weight for strength challenge. When moving bil UEs, pt is very good about avoiding shoulder elevation overhead. Advanced HEP for continued strengthening. Denies a pinching sensation at end range but is sore, especially with reaching behind her lower back.    PT Treatment/Interventions ADLs/Self Care Home Management;Electrical Stimulation;Moist  Heat;Ultrasound;Cryotherapy;Iontophoresis 4mg /ml Dexamethasone;Functional mobility training;Therapeutic exercise;Manual techniques;Taping;Dry needling;Passive range of motion;Patient/family education    PT Next Visit Plan IR behind back, OH strengthening    PT Home Exercise Plan ER/IR stretching (sleeper, seated, towel) and AAROM flexion, supine protraction, [id number]    Consulted and Agree with Plan of Care Patient           Patient will benefit from skilled therapeutic intervention in order to improve the following deficits and impairments:  Decreased range of motion, Decreased mobility, Decreased strength, Postural dysfunction, Impaired flexibility, Increased fascial restricitons, Hypomobility, Impaired UE functional use, Pain, Improper body mechanics  Visit Diagnosis: Acute pain of right shoulder  Stiffness of right shoulder, not elsewhere classified  Chronic pain of left knee  Abnormal posture     Problem List Patient Active Problem List   Diagnosis Date Noted  . HLD (hyperlipidemia)   . Abnormal urinalysis 10/15/2018  . Hypokalemia 10/15/2018  . Chest pain 10/15/2018  . Acute respiratory failure with hypoxia ([place]) 10/14/2018  . Recurrent major depressive disorder, in full remission ([place]) 10/28/2014  . Multinodular goiter 08/30/2012  . Allergic rhinitis 05/30/2012  . DM (diabetes mellitus) ([place]) 01/05/2011  . Controlled type 2 diabetes mellitus without complication ([place]) 00/86/7619  . Hypertension 01/04/2011  . Osteopenia 12/07/2010  . Glaucoma 02/27/2010  . 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 Systemic lupus erythematosus ([place]) 02/27/2010  Haylyn Halberg C. Tanasha Menees PT, [name] 06/12/20 4:26 PM   [facility] [address] [place], [place], [address] Phone: [phone]   Fax:  [phone]  Name: Deborah Weeks MRN: [id number] Date of Birth: Mar 11, 1946

## 2020-06-24 ENCOUNTER — Other Ambulatory Visit: Payer: Self-pay

## 2020-06-24 ENCOUNTER — Ambulatory Visit: Payer: Medicare Other | Admitting: Physical Therapy

## 2020-06-24 ENCOUNTER — Encounter: Payer: Self-pay | Admitting: Physical Therapy

## 2020-06-24 DIAGNOSIS — M25562 Pain in left knee: Secondary | ICD-10-CM

## 2020-06-24 DIAGNOSIS — M25511 Pain in right shoulder: Secondary | ICD-10-CM

## 2020-06-24 DIAGNOSIS — G8929 Other chronic pain: Secondary | ICD-10-CM

## 2020-06-24 DIAGNOSIS — M25611 Stiffness of right shoulder, not elsewhere classified: Secondary | ICD-10-CM

## 2020-06-24 DIAGNOSIS — R293 Abnormal posture: Secondary | ICD-10-CM

## 2020-06-24 NOTE — Therapy (Signed)
[place], [place], [address] Phone: [phone]   Fax:  [phone]  Physical Therapy Treatment  Patient Details  Name: Deborah Weeks MRN: [id number] Date of Birth: [birth year] Referring Provider (PT): Dr. Darlina Sicilian   Encounter Date: 06/24/2020   PT End of Session - 06/24/20 1550    Visit Number 8    Number of Visits 16    Date for PT Re-Evaluation 07/09/20    PT Start Time 4235    PT Stop Time 1625    PT Time Calculation (min) 39 min    Activity Tolerance Patient tolerated treatment well    Behavior During Therapy [facility] for tasks assessed/performed           Past Medical History:  Diagnosis Date  . Arthritis   . Diabetes mellitus without complication ([place])   . High cholesterol   . Hypertension   . Lupus ([place])   . Neuropathy     Past Surgical History:  Procedure Laterality Date  . BACK SURGERY    . LAPAROSCOPIC TUBAL LIGATION      There were no vitals filed for this visit.   Subjective Assessment - 06/24/20 1548    Subjective Shoulder is doing pretty well, still just limited in reaching behind my back. I still want the focus to be on my shoulder. Knee hurts when I get up and down.    Patient Stated Goals to use my Rt arm better.    Currently in Pain? No/denies    Aggravating Factors  reaching behind lower back              [facility] PT Assessment - 06/24/20 0001      AROM   Right Shoulder Internal Rotation --   L4 at midline before session                        [facility] Adult PT Treatment/Exercise - 06/24/20 0001      Shoulder Exercises: Sidelying   Other Sidelying Exercises IR hand on hip- PT guiding scapula    Other Sidelying Exercises extension- PT guiding shoulder & scapula      Shoulder Exercises: Standing   Internal Rotation Limitations extend-IR-extend-relax    Extension 20 reps    Theraband Level (Shoulder Extension) Level 3 (Green)    Other  Standing Exercises scap retraction to wall with red tband ER    Other Standing Exercises elbows on counter ER red tband      Shoulder Exercises: ROM/Strengthening   UBE (Upper Arm Bike) 3 min fwd, 2 min back L1      Shoulder Exercises: Stretch   Table Stretch - ABduction Limitations supine pec stretch 3x30s                    PT Short Term Goals - 06/10/20 1639      PT SHORT TERM GOAL #1   Title Pt will be I with HEP for Rt shoulder ROM and strength    Status Achieved      PT SHORT TERM GOAL #2   Title Pt will be able to reach Rt UE to central lumbar area for progress towards ADLs, min increase in pain .    Status Achieved      PT SHORT TERM GOAL #3   Title Pt will be able to reach to shoulder height with light object and min discomfort in Rt UE    Status Achieved  PT SHORT TERM GOAL #4   Title Pt will understand FOTO results for Rt UE and potential to improve condition.    Status Achieved             PT Long Term Goals - 05/14/20 2018      PT LONG TERM GOAL #1   Title Pt will improve FOTO score by 10 % or more to demo improved Shoulder mobility.    Time 8    Period Weeks    Status New    Target Date 07/09/20      PT LONG TERM GOAL #2   Title Pt will be able to improve sit to stand x 5 time to 13 sec or less    Time 8    Period Weeks    Status New    Target Date 07/09/20      PT LONG TERM GOAL #3   Title Pt will be able to reach Rt UE to 130 deg or more to retrieve a light object from a high cabinet.    Time 8    Period Weeks    Status New    Target Date 07/09/20      PT LONG TERM GOAL #4   Title Pt will be able to reach to back of neck and behind her back with no increase in pain    Time 8    Period Weeks    Status New    Target Date 07/09/20      PT LONG TERM GOAL #5   Title Pt will be I with more advanced HEP upong discharge for shoulder and knees    Time 8    Period Weeks    Status New    Target Date 07/09/20                  Plan - 06/24/20 1628    Clinical Impression Statement biggest limitation cont to be IR behind back. Noted that she is not allowing shoulder to move into extension in order to allow for available IR. Heavy verbal and tactile cuing required in exercises but was able to demo equal IR Rt to Lt with discomfort on Rt side by the end of treatment.    PT Treatment/Interventions ADLs/Self Care Home Management;Electrical Stimulation;Moist Heat;Ultrasound;Cryotherapy;Iontophoresis 4mg /ml Dexamethasone;Functional mobility training;Therapeutic exercise;Manual techniques;Taping;Dry needling;Passive range of motion;Patient/family education    PT Next Visit Plan IR behind back, OH strengthening    PT Home Exercise Plan ER/IR stretching (sleeper, seated, towel) and AAROM flexion, supine protraction, [id number]    Consulted and Agree with Plan of Care Patient           Patient will benefit from skilled therapeutic intervention in order to improve the following deficits and impairments:  Decreased range of motion, Decreased mobility, Decreased strength, Postural dysfunction, Impaired flexibility, Increased fascial restricitons, Hypomobility, Impaired UE functional use, Pain, Improper body mechanics  Visit Diagnosis: Acute pain of right shoulder  Stiffness of right shoulder, not elsewhere classified  Chronic pain of left knee  Abnormal posture     Problem List Patient Active Problem List   Diagnosis Date Noted  . HLD (hyperlipidemia)   . Abnormal urinalysis 10/15/2018  . Hypokalemia 10/15/2018  . Chest pain 10/15/2018  . Acute respiratory failure with hypoxia ([place]) 10/14/2018  . Recurrent major depressive disorder, in full remission ([place]) 10/28/2014  . Multinodular goiter 08/30/2012  . Allergic rhinitis 05/30/2012  . DM (diabetes mellitus) ([place]) 01/05/2011  . Controlled type 2 diabetes mellitus  without complication ([place]) 34/19/6222  . Hypertension 01/04/2011  . Osteopenia 12/07/2010   . Glaucoma 02/27/2010  . 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 Systemic lupus erythematosus ([place]) 02/27/2010   Joetta Delprado C. Lainy Wrobleski PT, [name] 06/24/20 4:31 PM   [facility] [address] [place], [place], [address] Phone: [phone]   Fax:  [phone]  Name: Deborah Weeks MRN: [id number] Date of Birth: Jun 10, 1946

## 2020-07-01 ENCOUNTER — Ambulatory Visit: Payer: Medicare Other | Admitting: Physical Therapy

## 2020-07-01 ENCOUNTER — Encounter: Payer: Self-pay | Admitting: Physical Therapy

## 2020-07-01 ENCOUNTER — Other Ambulatory Visit: Payer: Self-pay

## 2020-07-01 DIAGNOSIS — M25511 Pain in right shoulder: Secondary | ICD-10-CM

## 2020-07-01 DIAGNOSIS — M25611 Stiffness of right shoulder, not elsewhere classified: Secondary | ICD-10-CM

## 2020-07-01 NOTE — Therapy (Signed)
[place], [place], [address] Phone: [phone]   Fax:  [phone]  Physical Therapy Treatment  Patient Details  Name: Deborah Weeks MRN: [id number] Date of Birth: [birth year] Referring Provider (PT): Dr. Darlina Sicilian   Encounter Date: 07/01/2020   PT End of Session - 07/01/20 1543    Visit Number 9    Number of Visits 16    Date for PT Re-Evaluation 07/09/20    PT Start Time 1245    PT Stop Time 1625    PT Time Calculation (min) 42 min    Activity Tolerance Patient tolerated treatment well    Behavior During Therapy [facility] for tasks assessed/performed           Past Medical History:  Diagnosis Date  . Arthritis   . Diabetes mellitus without complication ([place])   . High cholesterol   . Hypertension   . Lupus ([place])   . Neuropathy     Past Surgical History:  Procedure Laterality Date  . BACK SURGERY    . LAPAROSCOPIC TUBAL LIGATION      There were no vitals filed for this visit.   Subjective Assessment - 07/01/20 1544    Subjective Able to reach to L1 just shy of midline in IR behind back becoming achey and hurting- up to 5/10 when reaching behind.    Patient Stated Goals to use my Rt arm better.    Currently in Pain? No/denies                             [facility] Adult PT Treatment/Exercise - 07/01/20 0001      Shoulder Exercises: Seated   Other Seated Exercises scapular retraction seated in chair- heavy cuing required.       Shoulder Exercises: ROM/Strengthening   UBE (Upper Arm Bike) 2/2 L1.5      Shoulder Exercises: Stretch   Other Shoulder Stretches thoracic extension over chair      Manual Therapy   Manual therapy comments edu in use of tennis ball & theracane    Joint Mobilization sidelying thoracic PA, Rt GHJ AP at 90 abd    Soft tissue mobilization Rt periscapular region, rt biceps                  PT Education - 07/01/20 1625     Education Details posture & IR- holding baby, scapular movement.    Person(s) Educated Patient    Methods Explanation;Demonstration;Tactile cues;Verbal cues;Handout    Comprehension Verbalized understanding;Returned demonstration;Verbal cues required;Tactile cues required;Need further instruction            PT Short Term Goals - 06/10/20 1639      PT SHORT TERM GOAL #1   Title Pt will be I with HEP for Rt shoulder ROM and strength    Status Achieved      PT SHORT TERM GOAL #2   Title Pt will be able to reach Rt UE to central lumbar area for progress towards ADLs, min increase in pain .    Status Achieved      PT SHORT TERM GOAL #3   Title Pt will be able to reach to shoulder height with light object and min discomfort in Rt UE    Status Achieved      PT SHORT TERM GOAL #4   Title Pt will understand FOTO results for Rt UE and potential to improve condition.  Status Achieved             PT Long Term Goals - 05/14/20 2018      PT LONG TERM GOAL #1   Title Pt will improve FOTO score by 10 % or more to demo improved Shoulder mobility.    Time 8    Period Weeks    Status New    Target Date 07/09/20      PT LONG TERM GOAL #2   Title Pt will be able to improve sit to stand x 5 time to 13 sec or less    Time 8    Period Weeks    Status New    Target Date 07/09/20      PT LONG TERM GOAL #3   Title Pt will be able to reach Rt UE to 130 deg or more to retrieve a light object from a high cabinet.    Time 8    Period Weeks    Status New    Target Date 07/09/20      PT LONG TERM GOAL #4   Title Pt will be able to reach to back of neck and behind her back with no increase in pain    Time 8    Period Weeks    Status New    Target Date 07/09/20      PT LONG TERM GOAL #5   Title Pt will be I with more advanced HEP upong discharge for shoulder and knees    Time 8    Period Weeks    Status New    Target Date 07/09/20                 Plan - 07/01/20 1626     Clinical Impression Statement Continues with pain in IR behind back. Limited mobility in thoracic region with increased thoracic kyphosis- she is unable to lay prone due to previous surgical intervention in her spine so mobilizations were attempted in sidelying. Added thoracic extension over chair to HEP. reports she is guilty of sitting in bed looking down at her phone to play games. 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 Advised that all of these habits are feeding limited ROM and pain- she will do her best to work on changing habits.    PT Treatment/Interventions ADLs/Self Care Home Management;Electrical Stimulation;Moist Heat;Ultrasound;Cryotherapy;Iontophoresis 4mg /ml Dexamethasone;Functional mobility training;Therapeutic exercise;Manual techniques;Taping;Dry needling;Passive range of motion;Patient/family education    PT Next Visit Plan DN to shoulder & biceps    PT Home Exercise Plan ER/IR stretching (sleeper, seated, towel) and AAROM flexion, supine protraction, [id number]    Consulted and Agree with Plan of Care Patient           Patient will benefit from skilled therapeutic intervention in order to improve the following deficits and impairments:  Decreased range of motion, Decreased mobility, Decreased strength, Postural dysfunction, Impaired flexibility, Increased fascial restricitons, Hypomobility, Impaired UE functional use, Pain, Improper body mechanics  Visit Diagnosis: Acute pain of right shoulder  Stiffness of right shoulder, not elsewhere classified     Problem List Patient Active Problem List   Diagnosis Date Noted  . HLD (hyperlipidemia)   . Abnormal urinalysis 10/15/2018  . Hypokalemia 10/15/2018  . Chest pain 10/15/2018  . Acute respiratory failure with hypoxia ([place]) 10/14/2018  . Recurrent major depressive disorder, in full remission ([place]) 10/28/2014  . Multinodular goiter 08/30/2012  . Allergic rhinitis  05/30/2012  . DM (diabetes mellitus) ([place]) 01/05/2011  . Controlled type 2 diabetes mellitus without complication ([place]) 05/01/5749  . Hypertension 01/04/2011  . Osteopenia 12/07/2010  . Glaucoma 02/27/2010  . 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 Systemic lupus erythematosus ([place]) 02/27/2010    Terion Hedman C. Edgar Corrigan PT, [name] 07/01/20 6:13 PM   [place], [place], [address] Phone: [phone]   Fax:  [phone]  Name: Deborah Weeks MRN: [id number] Date of Birth: April 17, 1946

## 2020-07-03 ENCOUNTER — Ambulatory Visit (INDEPENDENT_AMBULATORY_CARE_PROVIDER_SITE_OTHER): Payer: Medicare Other | Admitting: Sports Medicine

## 2020-07-03 ENCOUNTER — Other Ambulatory Visit: Payer: Self-pay

## 2020-07-03 ENCOUNTER — Encounter: Payer: Self-pay | Admitting: Sports Medicine

## 2020-07-03 DIAGNOSIS — B351 Tinea unguium: Secondary | ICD-10-CM

## 2020-07-03 DIAGNOSIS — M79675 Pain in left toe(s): Secondary | ICD-10-CM

## 2020-07-03 DIAGNOSIS — E1142 Type 2 diabetes mellitus with diabetic polyneuropathy: Secondary | ICD-10-CM

## 2020-07-03 DIAGNOSIS — M79672 Pain in left foot: Secondary | ICD-10-CM

## 2020-07-03 DIAGNOSIS — M79671 Pain in right foot: Secondary | ICD-10-CM

## 2020-07-03 DIAGNOSIS — M79674 Pain in right toe(s): Secondary | ICD-10-CM

## 2020-07-03 NOTE — Progress Notes (Signed)
Patient ID: Deborah Weeks, female   DOB: 01/09/1946, 74 y.o.   MRN: [id number] Subjective: Deborah Weeks is a 74 y.o. female patient seen today in office for diabetic foot check and nail trim.  Reports that her blood sugar was not recorded and reports no changes of medication since last encounter.   Patient Active Problem List   Diagnosis Date Noted  . HLD (hyperlipidemia)   . Abnormal urinalysis 10/15/2018  . Hypokalemia 10/15/2018  . Chest pain 10/15/2018  . Acute respiratory failure with hypoxia ([place]) 10/14/2018  . Recurrent major depressive disorder, in full remission ([place]) 10/28/2014  . Multinodular goiter 08/30/2012  . Allergic rhinitis 05/30/2012  . DM (diabetes mellitus) ([place]) 01/05/2011  . Controlled type 2 diabetes mellitus without complication ([place]) 97/94/8016  . Hypertension 01/04/2011  . Osteopenia 12/07/2010  . Glaucoma 02/27/2010  . 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 Systemic lupus erythematosus ([place]) 02/27/2010    Current Outpatient Medications on File Prior to Visit  Medication Sig Dispense Refill  . acetaminophen (TYLENOL) 325 MG tablet Take 2 tablets (650 mg total) by mouth every 6 (six) hours as needed for mild pain or headache (fever >/= 101).    Marland Kitchen albuterol (PROVENTIL HFA;VENTOLIN HFA) 108 (90 Base) MCG/ACT inhaler Inhale 2 puffs every 4 (four) hours as needed into the lungs for wheezing or shortness of breath. 1 Inhaler 0  . amLODipine (NORVASC) 10 MG tablet Take 1 tablet (10 mg total) by mouth daily. 30 tablet 0  . aspirin EC 81 MG tablet Take 81 mg by mouth daily.    . Blood Glucose Monitoring Suppl (ONETOUCH VERIO FLEX SYSTEM) w/Device KIT 2 (two) times daily.     . carvedilol (COREG) 25 MG tablet Take 25 mg by mouth 2 (two) times daily.     . diclofenac (VOLTAREN) 75 MG EC tablet TAKE 1 TABLET BY MOUTH TWICE A DAY (Patient not taking: Reported on 05/14/2020) 90 tablet 5  . furosemide (LASIX) 20 MG tablet Take 1 tablet (20 mg total) by  mouth 2 (two) times daily. 180 tablet 3  . Lancets (ONETOUCH DELICA PLUS [id number]) MISC 3 (three) times daily.     Marland Kitchen linagliptin (TRADJENTA) 5 MG TABS tablet Take 1 tablet (5 mg total) by mouth daily. 30 tablet 1  . LYRICA 75 MG capsule Take 75 mg by mouth 3 (three) times daily.  1  . metFORMIN (GLUCOPHAGE) 500 MG tablet Take 1,000 mg by mouth 2 (two) times daily with a meal.    . ONETOUCH VERIO test strip 3 (three) times daily.     . pravastatin (PRAVACHOL) 10 MG tablet Take 10 mg by mouth daily.      Current Facility-Administered Medications on File Prior to Visit  Medication Dose Route Frequency Provider Last Rate Last Admin  . triamcinolone acetonide (KENALOG) 10 MG/ML injection 10 mg  10 mg Other Once Landis Martins, DPM        Allergies  Allergen Reactions  . Atorvastatin Other (See Comments)    Cramps   . Escitalopram Oxalate Other (See Comments)    Insomnia, but patient currently takes this  . Codeine Nausea And Vomiting  . Niacin And Related Itching and Rash    Objective: Physical Exam  General: Well developed, nourished, no acute distress, awake, alert and oriented x 3  Vascular: Dorsalis pedis artery 2/4 bilateral, Posterior tibial artery 1/4 bilateral, skin temperature warm to warm proximal to distal bilateral lower extremities, no varicosities, pedal hair present bilateral.  Trace edema bilateral.  Neurological: Gross sensation present via light touch bilateral.  Dermatological: Skin is warm, dry, and supple bilateral, Nails 1-10 are elonagted thick, and discolored with mild subungal debris with most involved nails bilateral hallux with no acute ingrowing noted, no webspace macerations present bilateral, no open lesions present bilateral, no callus/corns. + Dry skin to heels bilateral. No signs of infection bilateral.  Musculoskeletal: No symptomatic boney deformities noted bilateral. Muscular strength within normal limits without pain on range of motion. No pain  with calf compression bilateral.   Assessment and Plan:  Problem List Items Addressed This Visit    None    Visit Diagnoses    Pain due to onychomycosis of toenails of both feet    -  Primary   Diabetic polyneuropathy associated with type 2 diabetes mellitus (HCC)       Foot pain, bilateral         -Examined patient -Re-discussed foot care in the setting of diabetes and daily foot inspection -Nails x10 mechanically debrided with sterile nail nipper without incident   -Gave sample of foot miracle cream to use from dry skin at heels -Patient to return in  2.5-3 months for follow up evaluation/Diabetic nail care or sooner if symptoms worsen.  Landis Martins, DPM

## 2020-07-04 ENCOUNTER — Ambulatory Visit: Payer: Medicare Other | Admitting: Physical Therapy

## 2020-07-07 ENCOUNTER — Other Ambulatory Visit: Payer: Self-pay

## 2020-07-07 ENCOUNTER — Ambulatory Visit: Payer: Medicare Other | Admitting: Physical Therapy

## 2020-07-07 DIAGNOSIS — M25611 Stiffness of right shoulder, not elsewhere classified: Secondary | ICD-10-CM

## 2020-07-07 DIAGNOSIS — M25511 Pain in right shoulder: Secondary | ICD-10-CM

## 2020-07-07 DIAGNOSIS — G8929 Other chronic pain: Secondary | ICD-10-CM

## 2020-07-07 DIAGNOSIS — R293 Abnormal posture: Secondary | ICD-10-CM

## 2020-07-07 NOTE — Therapy (Signed)
[place], [place], [address] Phone: [phone]   Fax:  [phone]  Physical Therapy Treatment/Renewal   Patient Details  Name: Deborah Weeks MRN: [id number] Date of Birth: 03-25-1946 Referring Provider (PT): Dr. Darlina Sicilian    Progress Note Reporting Period 05/14/20 to 07/07/20  See note below for Objective Data and Assessment of Progress/Goals.      Encounter Date: 07/07/2020   PT End of Session - 07/07/20 1534    Visit Number 10    Number of Visits 16    Date for PT Re-Evaluation 08/18/20    PT Start Time 9628    PT Stop Time 1620    PT Time Calculation (min) 47 min           Past Medical History:  Diagnosis Date  . Arthritis   . Diabetes mellitus without complication ([place])   . High cholesterol   . Hypertension   . Lupus ([place])   . Neuropathy     Past Surgical History:  Procedure Laterality Date  . BACK SURGERY    . LAPAROSCOPIC TUBAL LIGATION      There were no vitals filed for this visit.   Subjective Assessment - 07/07/20 1626    Subjective Pt is tired, was watching the kids today.  Walked 2 miles with them. Feels no pain at rest. My knee is fine.    Patient Stated Goals to use my Rt arm better.    Currently in Pain? No/denies              [facility] PT Assessment - 07/07/20 0001      Observation/Other Assessments   Focus on Therapeutic Outcomes (FOTO)  37%      AROM   Right Shoulder Extension 60 Degrees    Right Shoulder Flexion 122 Degrees    Right Shoulder ABduction 120 Degrees    Right Shoulder Internal Rotation --   reaches to Rt hip , with assist of Lt UE to L3   Right Shoulder External Rotation --   lacks , can only reach R upper trap     PROM   Overall PROM Comments pain end range flexion, IR and combined ER/Abd       Strength   Right Shoulder Flexion 4/5    Right Shoulder Internal Rotation 5/5    Right Shoulder External Rotation 4+/5                 [facility] Adult PT Treatment/Exercise - 07/07/20 0001      Shoulder Exercises: Seated   External Rotation Strengthening;Both;12 reps;Theraband    Theraband Level (Shoulder External Rotation) Level 3 (Green)    Other Seated Exercises thoracic ext over back of chair x 10 arms behind       Shoulder Exercises: Sidelying   Flexion Strengthening;Right;10 reps    Flexion Weight (lbs) 1    ABduction Strengthening;Right;10 reps    ABduction Weight (lbs) 1      Shoulder Exercises: Standing   Horizontal ABduction Strengthening;Both;15 reps    Theraband Level (Shoulder Horizontal ABduction) Level 2 (Red);Level 3 (Green)    External Rotation Strengthening;Both;15 reps    Theraband Level (Shoulder External Rotation) Level 2 (Red);Level 3 (Green)    Retraction Weight (lbs) x 10 on foam roller/wall     Shoulder Elevation Limitations use foam roller for improving T-extension       Shoulder Exercises: ROM/Strengthening   Nustep UE and LE L4 for 5 min  Manual Therapy   Soft tissue mobilization Rt periscapular region     Passive ROM all planes                    PT Education - 07/07/20 1630    Education Details posture, HEP tips, consistency    Person(s) Educated Patient    Methods Explanation;Demonstration    Comprehension Verbalized understanding;Returned demonstration;Need further instruction            PT Short Term Goals - 07/07/20 1534      PT SHORT TERM GOAL #1   Title Pt will be I with HEP for Rt shoulder ROM and strength    Status Achieved      PT SHORT TERM GOAL #2   Title Pt will be able to reach Rt UE to central lumbar area for progress towards ADLs, min increase in pain .    Status Achieved      PT SHORT TERM GOAL #3   Title Pt will be able to reach to shoulder height with light object and min discomfort in Rt UE    Status Achieved      PT SHORT TERM GOAL #4   Title Pt will understand FOTO results for Rt UE and potential to improve condition.     Status Achieved             PT Long Term Goals - 07/07/20 1631      PT LONG TERM GOAL #1   Title Pt will improve FOTO score by 10 % or more to demo improved Shoulder mobility.    Baseline 9% improved    Time 6    Status On-going      PT LONG TERM GOAL #2   Title Pt will be able to improve sit to stand x 5 time to 13 sec or less    Status Unable to assess      PT LONG TERM GOAL #3   Title Pt will be able to reach Rt UE to 130 deg or more to retrieve a light object from a high cabinet.    Baseline 120-122deg without compensations    Time 6    Period Weeks    Status On-going      PT LONG TERM GOAL #4   Title Pt will be able to reach to back of neck and behind her back with no increase in pain    Time 6    Status On-going      PT LONG TERM GOAL #5   Title Pt will be I with more advanced HEP upon discharge for shoulder and knees    Status On-going                 Plan - 07/07/20 1545    Clinical Impression Statement Patient continues to have decent strength in Rt UE and improved AROM for more functional activities at home. FOTO improved to 37% limited (goal = 36%).  She continues to have pain and restriction with reaching back and to low back , across her body.  Her posture shows thoracic kyphosis which limits AROM as well.  She will benefit from PT 1 x per week to contiue addressing deficits to positively impact QOL.    Examination-Activity Limitations Bathing;Reach Overhead;Bed Mobility;Dressing;Hygiene/Grooming;Caring for Others;Carry;Transfers;Lift;Sleep    Examination-Participation Restrictions Church;Interpersonal Relationship;Volunteer;Occupation;Cleaning;Laundry;Community Activity;Shop    PT Frequency 1x / week    PT Duration 6 weeks    PT Treatment/Interventions ADLs/Self Care Home Management;Electrical Stimulation;Moist  Heat;Ultrasound;Cryotherapy;Iontophoresis 4mg /ml Dexamethasone;Functional mobility training;Therapeutic exercise;Manual techniques;Taping;Dry  needling;Passive range of motion;Patient/family education    PT Next Visit Plan cont posture and posterior chain strength, IR and ER AROM    PT Home Exercise Plan ER/IR stretching (sleeper, seated, towel) and AAROM flexion, supine protraction, [id number]    Consulted and Agree with Plan of Care Patient           Patient will benefit from skilled therapeutic intervention in order to improve the following deficits and impairments:  Decreased range of motion, Decreased mobility, Decreased strength, Postural dysfunction, Impaired flexibility, Increased fascial restricitons, Hypomobility, Impaired UE functional use, Pain, Improper body mechanics  Visit Diagnosis: Acute pain of right shoulder  Stiffness of right shoulder, not elsewhere classified  Chronic pain of left knee  Abnormal posture     Problem List Patient Active Problem List   Diagnosis Date Noted  . HLD (hyperlipidemia)   . Abnormal urinalysis 10/15/2018  . Hypokalemia 10/15/2018  . Chest pain 10/15/2018  . Acute respiratory failure with hypoxia ([place]) 10/14/2018  . Recurrent major depressive disorder, in full remission ([place]) 10/28/2014  . Multinodular goiter 08/30/2012  . Allergic rhinitis 05/30/2012  . DM (diabetes mellitus) ([place]) 01/05/2011  . Controlled type 2 diabetes mellitus without complication ([place]) 47/04/6150  . Hypertension 01/04/2011  . Osteopenia 12/07/2010  . Glaucoma 02/27/2010  . 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 Systemic lupus erythematosus ([place]) 02/27/2010    Deborah Weeks 07/07/2020, 4:36 PM  [facility] [address] [place], [place], [address] Phone: [phone]   Fax:  [phone]  Name: Deborah Weeks MRN: [id number] Date of Birth: 1945/11/04  Raeford Razor, PT 07/07/20 4:37 PM Phone: [phone] Fax: [phone]

## 2020-07-09 ENCOUNTER — Other Ambulatory Visit: Payer: Self-pay

## 2020-07-09 ENCOUNTER — Ambulatory Visit: Payer: Medicare Other | Admitting: Physical Therapy

## 2020-07-09 ENCOUNTER — Encounter: Payer: Self-pay | Admitting: Physical Therapy

## 2020-07-09 DIAGNOSIS — M25611 Stiffness of right shoulder, not elsewhere classified: Secondary | ICD-10-CM

## 2020-07-09 DIAGNOSIS — M25511 Pain in right shoulder: Secondary | ICD-10-CM

## 2020-07-09 DIAGNOSIS — R293 Abnormal posture: Secondary | ICD-10-CM

## 2020-07-09 NOTE — Therapy (Signed)
[place], [place], [address] Phone: [phone]   Fax:  [phone]  Physical Therapy Treatment  Patient Details  Name: Deborah Weeks MRN: [id number] Date of Birth: 04-27-1946 Referring Provider (PT): Dr. Darlina Sicilian   Encounter Date: 07/09/2020   PT End of Session - 07/09/20 1627    Visit Number 11    Number of Visits 16    Date for PT Re-Evaluation 08/18/20    PT Start Time 1619    PT Stop Time 1707    PT Time Calculation (min) 48 min    Activity Tolerance Patient tolerated treatment well    Behavior During Therapy [facility] for tasks assessed/performed           Past Medical History:  Diagnosis Date  . Arthritis   . Diabetes mellitus without complication ([employer])   . High cholesterol   . Hypertension   . Lupus ([place])   . Neuropathy     Past Surgical History:  Procedure Laterality Date  . BACK SURGERY    . LAPAROSCOPIC TUBAL LIGATION      There were no vitals filed for this visit.   Subjective Assessment - 07/09/20 1623    Subjective Shoulder is OK.  I can see its geting better.              [facility] Adult PT Treatment/Exercise - 07/09/20 0001      Shoulder Exercises: ROM/Strengthening   UBE (Upper Arm Bike) 6 min L1     Wall Wash looped yellow band for scapular strength    semi circles    Other ROM/Strengthening Exercises IR with strap x 3     Other ROM/Strengthening Exercises looped yellow band for ER (isometric) then overhead x 10 each    on wall for posture     Shoulder Exercises: Stretch   Internal Rotation Stretch Limitations sleeper x 5 post capsule     Other Shoulder Stretches extension with strap x 5     Other Shoulder Stretches traction stretching cross body and other directions , difficulty feeling stretch       Modalities   Modalities Moist Heat      Moist Heat Therapy   Number Minutes Moist Heat 10 Minutes    Moist Heat Location Shoulder      Manual  Therapy   Joint Mobilization inferior glides to increase abd    Soft tissue mobilization Rt periscapular region     Passive ROM all planes , focus on ER/IR                     PT Short Term Goals - 07/07/20 1534      PT SHORT TERM GOAL #1   Title Pt will be I with HEP for Rt shoulder ROM and strength    Status Achieved      PT SHORT TERM GOAL #2   Title Pt will be able to reach Rt UE to central lumbar area for progress towards ADLs, min increase in pain .    Status Achieved      PT SHORT TERM GOAL #3   Title Pt will be able to reach to shoulder height with light object and min discomfort in Rt UE    Status Achieved      PT SHORT TERM GOAL #4   Title Pt will understand FOTO results for Rt UE and potential to improve condition.    Status Achieved  PT Long Term Goals - 07/07/20 1631      PT LONG TERM GOAL #1   Title Pt will improve FOTO score by 10 % or more to demo improved Shoulder mobility.    Baseline 9% improved    Time 6    Status On-going      PT LONG TERM GOAL #2   Title Pt will be able to improve sit to stand x 5 time to 13 sec or less    Status Unable to assess      PT LONG TERM GOAL #3   Title Pt will be able to reach Rt UE to 130 deg or more to retrieve a light object from a high cabinet.    Baseline 120-122deg without compensations    Time 6    Period Weeks    Status On-going      PT LONG TERM GOAL #4   Title Pt will be able to reach to back of neck and behind her back with no increase in pain    Time 6    Status On-going      PT LONG TERM GOAL #5   Title Pt will be I with more advanced HEP upon discharge for shoulder and knees    Status On-going                 Plan - 07/09/20 1702    Clinical Impression Statement Patient showed increased postural compensations in bilateral shoulders with lifting even to shoulder height today.  Pain increases during exercise to a degree, used MHP to reduce soreness.  Modified band to  light to allow more ROM.    PT Treatment/Interventions ADLs/Self Care Home Management;Electrical Stimulation;Moist Heat;Ultrasound;Cryotherapy;Iontophoresis 4mg /ml Dexamethasone;Functional mobility training;Therapeutic exercise;Manual techniques;Taping;Dry needling;Passive range of motion;Patient/family education    PT Next Visit Plan cont posture and posterior chain strength, IR and ER AROM    PT Home Exercise Plan ER/IR stretching (sleeper, seated, towel) and AAROM flexion, supine protraction, [id number]    Consulted and Agree with Plan of Care Patient           Patient will benefit from skilled therapeutic intervention in order to improve the following deficits and impairments:  Decreased range of motion, Decreased mobility, Decreased strength, Postural dysfunction, Impaired flexibility, Increased fascial restricitons, Hypomobility, Impaired UE functional use, Pain, Improper body mechanics  Visit Diagnosis: Acute pain of right shoulder  Stiffness of right shoulder, not elsewhere classified  Abnormal posture     Problem List Patient Active Problem List   Diagnosis Date Noted  . HLD (hyperlipidemia)   . Abnormal urinalysis 10/15/2018  . Hypokalemia 10/15/2018  . Chest pain 10/15/2018  . Acute respiratory failure with hypoxia ([place]) 10/14/2018  . Recurrent major depressive disorder, in full remission ([place]) 10/28/2014  . Multinodular goiter 08/30/2012  . Allergic rhinitis 05/30/2012  . DM (diabetes mellitus) ([place]) 01/05/2011  . Controlled type 2 diabetes mellitus without complication ([place]) 02/58/5277  . Hypertension 01/04/2011  . Osteopenia 12/07/2010  . Glaucoma 02/27/2010  . 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 Systemic lupus erythematosus ([place]) 02/27/2010    [name] 07/09/2020, 5:05 PM  [facility] [address] [place], [place], [address] Phone: [phone]   Fax:  [phone]  Name: Deborah Weeks MRN: [id number] Date of Birth: 27-Mar-1946  Raeford Razor, PT 07/09/20 5:05 PM Phone: [phone] Fax: [phone]

## 2020-07-10 ENCOUNTER — Ambulatory Visit
Admission: RE | Admit: 2020-07-10 | Discharge: 2020-07-10 | Disposition: A | Discharge status: Home / Self Care | Payer: Medicare Other | Source: Ambulatory Visit | Attending: Family Medicine | Admitting: Family Medicine

## 2020-07-10 DIAGNOSIS — Z1231 Encounter for screening mammogram for malignant neoplasm of breast: Secondary | ICD-10-CM

## 2020-07-28 ENCOUNTER — Ambulatory Visit: Payer: Medicare Other | Admitting: Physical Therapy

## 2020-08-04 ENCOUNTER — Ambulatory Visit: Payer: Medicare Other | Admitting: Physical Therapy

## 2020-08-11 ENCOUNTER — Other Ambulatory Visit: Payer: Self-pay

## 2020-08-11 ENCOUNTER — Ambulatory Visit: Payer: Medicare Other | Attending: Family Medicine | Admitting: Physical Therapy

## 2020-08-11 ENCOUNTER — Encounter: Payer: Self-pay | Admitting: Physical Therapy

## 2020-08-11 DIAGNOSIS — R293 Abnormal posture: Secondary | ICD-10-CM | POA: Insufficient documentation

## 2020-08-11 DIAGNOSIS — M25562 Pain in left knee: Secondary | ICD-10-CM | POA: Insufficient documentation

## 2020-08-11 DIAGNOSIS — M25511 Pain in right shoulder: Secondary | ICD-10-CM | POA: Insufficient documentation

## 2020-08-11 DIAGNOSIS — G8929 Other chronic pain: Secondary | ICD-10-CM | POA: Diagnosis present

## 2020-08-11 DIAGNOSIS — M25611 Stiffness of right shoulder, not elsewhere classified: Secondary | ICD-10-CM | POA: Diagnosis present

## 2020-08-11 NOTE — Therapy (Addendum)
[place], [place], [address] Phone: [phone]   Fax:  [phone]  Physical Therapy Treatment/Discharge   Patient Details  Name: Deborah Weeks MRN: [id number] Date of Birth: Mar 09, 1946 Referring Provider (PT): Dr. Darlina Sicilian   Encounter Date: 08/11/2020   PT End of Session - 08/11/20 1622    Visit Number 12    Number of Visits 16    Date for PT Re-Evaluation 08/18/20    Authorization Type MCR, MCD    PT Start Time 1620    PT Stop Time 1700    PT Time Calculation (min) 40 min    Activity Tolerance Patient tolerated treatment well    Behavior During Therapy [facility] for tasks assessed/performed           Past Medical History:  Diagnosis Date  . Arthritis   . Diabetes mellitus without complication ([place])   . High cholesterol   . Hypertension   . Lupus ([place])   . Neuropathy     Past Surgical History:  Procedure Laterality Date  . BACK SURGERY    . LAPAROSCOPIC TUBAL LIGATION      There were no vitals filed for this visit.   Subjective Assessment - 08/11/20 1621    Subjective Patient had a family issue and missed visits.  Her brother passed.  No pain right now.  I just cant reach behind me.    Currently in Pain? No/denies              [facility] PT Assessment - 08/11/20 0001      Observation/Other Assessments   Focus on Therapeutic Outcomes (FOTO)  24%      AROM   Right Shoulder Flexion 120 Degrees    Right Shoulder ABduction 120 Degrees    Right Shoulder Internal Rotation --   Rt low cervical    Right Shoulder External Rotation --   reached to lower cervical , elbow in      PROM   Overall PROM Comments pain end range ER and IR       Strength   Right Shoulder Flexion 4+/5    Right Shoulder ABduction 4+/5    Right Shoulder Internal Rotation 5/5    Right Shoulder External Rotation 5/5               [facility] Adult PT Treatment/Exercise - 08/11/20 0001      Shoulder  Exercises: Supine   Horizontal ABduction Strengthening;Both;15 reps    Theraband Level (Shoulder Horizontal ABduction) Level 2 (Red)    External Rotation Strengthening;Both;15 reps    Theraband Level (Shoulder External Rotation) Level 2 (Red)      Shoulder Exercises: Standing   Horizontal ABduction Weight (lbs) goal post arms x 10     Shoulder Flexion Weight (lbs) standing flexion with wand x 10     Extension Strengthening;Both;20 reps    Theraband Level (Shoulder Extension) Level 3 (Green)    Row Strengthening;Both;20 reps    Theraband Level (Shoulder Row) Level 3 (Green)      Manual Therapy   Passive ROM gentle external rotation and internal rotation                   PT Education - 08/11/20 1643    Education Details posture, hEP, plan of care    Person(s) Educated Patient    Methods Explanation;Demonstration    Comprehension Verbalized understanding;Returned demonstration            PT  Short Term Goals - 07/07/20 1534      PT SHORT TERM GOAL #1   Title Pt will be I with HEP for Rt shoulder ROM and strength    Status Achieved      PT SHORT TERM GOAL #2   Title Pt will be able to reach Rt UE to central lumbar area for progress towards ADLs, min increase in pain .    Status Achieved      PT SHORT TERM GOAL #3   Title Pt will be able to reach to shoulder height with light object and min discomfort in Rt UE    Status Achieved      PT SHORT TERM GOAL #4   Title Pt will understand FOTO results for Rt UE and potential to improve condition.    Status Achieved             PT Long Term Goals - 08/11/20 1703      PT LONG TERM GOAL #1   Title Pt will improve FOTO score by 10 % or more to demo improved Shoulder mobility.    Baseline 22%    Status Achieved      PT LONG TERM GOAL #2   Title Pt will be able to improve sit to stand x 5 time to 13 sec or less    Status Unable to assess      PT LONG TERM GOAL #3   Title Pt will be able to reach Rt UE to 130 deg  or more to retrieve a light object from a high cabinet.    Baseline 120-122deg without compensations    Status Not Met      PT LONG TERM GOAL #4   Title Pt will be able to reach to back of neck and behind her back with no increase in pain    Baseline min pain low back and back of neck, stiff    Status On-going      PT LONG TERM GOAL #5   Title Pt will be I with more advanced HEP upon discharge for shoulder and knees    Status On-going                 Plan - 08/11/20 1630    Clinical Impression Statement Patient has missed appts due to family issues.  She is very strong but has limitations in overhead reaching and functional reahcing behind her back and to back of neck. 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  Given a more streamlined HEP so she can perform more efficiently.  SHe sess MD tomorrow and will discuss DC vs continueing PT and call us back to let us know.    PT Treatment/Interventions ADLs/Self Care Home Management;Electrical Stimulation;Moist Heat;Ultrasound;Cryotherapy;Iontophoresis 36m/ml Dexamethasone;Functional mobility training;Therapeutic exercise;Manual techniques;Taping;Dry needling;Passive range of motion;Patient/family education    PT Next Visit Plan Cont vs discharge . What did MD do, say? posture and posterior chain strength, IR and ER AROM    PT Home Exercise Plan ER/IR stretching (sleeper, seated, towel) and AAROM flexion, supine protraction, [id number]    Consulted and Agree with Plan of Care Patient           Patient will benefit from skilled therapeutic intervention in order to improve the following deficits and impairments:  Decreased range of motion, Decreased mobility, Decreased strength, Postural dysfunction, Impaired flexibility, Increased fascial restricitons, Hypomobility, Impaired UE functional use, Pain, Improper body mechanics  Visit Diagnosis: Acute pain of right shoulder  Stiffness of  right shoulder, not elsewhere classified  Abnormal  posture  Chronic pain of left knee     Problem List Patient Active Problem List   Diagnosis Date Noted  . HLD (hyperlipidemia)   . Abnormal urinalysis 10/15/2018  . Hypokalemia 10/15/2018  . Chest pain 10/15/2018  . Acute respiratory failure with hypoxia ([place]) 10/14/2018  . Recurrent major depressive disorder, in full remission ([place]) 10/28/2014  . Multinodular goiter 08/30/2012  . Allergic rhinitis 05/30/2012  . DM (diabetes mellitus) ([place]) 01/05/2011  . Controlled type 2 diabetes mellitus without complication ([place]) 18/84/1660  . Hypertension 01/04/2011  . Osteopenia 12/07/2010  . Glaucoma 02/27/2010  . 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 Systemic lupus erythematosus ([place]) 02/27/2010    PAA,JENNIFER 08/11/2020, 5:09 PM  [facility] [address] [place], [place], [address] Phone: [phone]   Fax:  [phone]  Name: Deborah Weeks MRN: [id number] Date of Birth: 1946-03-15  Raeford Razor, PT 08/11/20 5:10 PM Phone: [phone] Fax: [phone]   PHYSICAL THERAPY DISCHARGE SUMMARY  Visits from Start of Care: 12  Current functional level related to goals / functional outcomes: See above for most recent info    Remaining deficits: Occasional pain , weakness, stiffness , posture    Education / Equipment: HEP, posture, RICE, lifting , body mechanics  Plan: Patient agrees to discharge.  Patient goals were partially met. Patient is being discharged due to being pleased with the current functional level.  ?????     Raeford Razor, PT 08/21/20 12:54 PM Phone: [phone] Fax: [phone]

## 2020-08-18 ENCOUNTER — Ambulatory Visit: Payer: Medicare Other | Admitting: Physical Therapy

## 2020-08-18 ENCOUNTER — Telehealth: Payer: Self-pay | Admitting: Physical Therapy

## 2020-08-18 NOTE — Telephone Encounter (Signed)
Patient was a no show for her PT appt this afternoon.  Called and left message on voicemail reminding her of her next visit. Asked to call if PT no longer needed.    Raeford Razor, PT 08/18/20 4:55 PM Phone: [phone] Fax: [phone]

## 2020-08-25 ENCOUNTER — Ambulatory Visit: Payer: Medicare Other | Admitting: Physical Therapy

## 2020-09-01 ENCOUNTER — Encounter: Payer: Medicare Other | Admitting: Physical Therapy

## 2020-09-08 ENCOUNTER — Encounter: Payer: Medicare Other | Admitting: Physical Therapy

## 2020-10-02 ENCOUNTER — Ambulatory Visit (INDEPENDENT_AMBULATORY_CARE_PROVIDER_SITE_OTHER): Payer: Medicare Other | Admitting: Sports Medicine

## 2020-10-02 ENCOUNTER — Other Ambulatory Visit: Payer: Self-pay

## 2020-10-02 ENCOUNTER — Encounter: Payer: Self-pay | Admitting: Sports Medicine

## 2020-10-02 DIAGNOSIS — M79675 Pain in left toe(s): Secondary | ICD-10-CM

## 2020-10-02 DIAGNOSIS — M79674 Pain in right toe(s): Secondary | ICD-10-CM | POA: Diagnosis not present

## 2020-10-02 DIAGNOSIS — E1142 Type 2 diabetes mellitus with diabetic polyneuropathy: Secondary | ICD-10-CM

## 2020-10-02 DIAGNOSIS — B351 Tinea unguium: Secondary | ICD-10-CM | POA: Diagnosis not present

## 2020-10-02 NOTE — Progress Notes (Signed)
Patient ID: Deborah Weeks, female   DOB: [birth year], 74 y.o.   MRN: [id number] Subjective: Deborah Weeks is a 74 y.o. female patient seen today in office for diabetic foot check and nail trim.  Reports that her blood sugar was not recorded and reports no changes of medication since last encounter. Reports that things are doing good. No other issues noted.   Patient Active Problem List   Diagnosis Date Noted  . HLD (hyperlipidemia)   . Abnormal urinalysis 10/15/2018  . Hypokalemia 10/15/2018  . Chest pain 10/15/2018  . Acute respiratory failure with hypoxia ([place]) 10/14/2018  . Recurrent major depressive disorder, in full remission ([place]) 10/28/2014  . Multinodular goiter 08/30/2012  . Allergic rhinitis 05/30/2012  . DM (diabetes mellitus) ([place]) 01/05/2011  . Controlled type 2 diabetes mellitus without complication ([place]) 46/50/3546  . Hypertension 01/04/2011  . Osteopenia 12/07/2010  . Glaucoma 02/27/2010  . 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 Systemic lupus erythematosus ([place]) 02/27/2010    Current Outpatient Medications on File Prior to Visit  Medication Sig Dispense Refill  . acetaminophen (TYLENOL) 325 MG tablet Take 2 tablets (650 mg total) by mouth every 6 (six) hours as needed for mild pain or headache (fever >/= 101).    Marland Kitchen albuterol (PROVENTIL HFA;VENTOLIN HFA) 108 (90 Base) MCG/ACT inhaler Inhale 2 puffs every 4 (four) hours as needed into the lungs for wheezing or shortness of breath. 1 Inhaler 0  . amLODipine (NORVASC) 10 MG tablet Take 1 tablet (10 mg total) by mouth daily. 30 tablet 0  . aspirin EC 81 MG tablet Take 81 mg by mouth daily.    . Blood Glucose Monitoring Suppl (ONETOUCH VERIO FLEX SYSTEM) w/Device KIT 2 (two) times daily.     . carvedilol (COREG) 25 MG tablet Take 25 mg by mouth 2 (two) times daily.     . diclofenac (VOLTAREN) 75 MG EC tablet TAKE 1 TABLET BY MOUTH TWICE A DAY (Patient not taking: Reported on 05/14/2020) 90 tablet 5  .  furosemide (LASIX) 20 MG tablet Take 1 tablet (20 mg total) by mouth 2 (two) times daily. 180 tablet 3  . Lancets (ONETOUCH DELICA PLUS [id number]) MISC 3 (three) times daily.     Marland Kitchen linagliptin (TRADJENTA) 5 MG TABS tablet Take 1 tablet (5 mg total) by mouth daily. 30 tablet 1  . LYRICA 75 MG capsule Take 75 mg by mouth 3 (three) times daily.  1  . metFORMIN (GLUCOPHAGE) 500 MG tablet Take 1,000 mg by mouth 2 (two) times daily with a meal.    . ONETOUCH VERIO test strip 3 (three) times daily.     . pravastatin (PRAVACHOL) 10 MG tablet Take 10 mg by mouth daily.      Current Facility-Administered Medications on File Prior to Visit  Medication Dose Route Frequency Provider Last Rate Last Admin  . triamcinolone acetonide (KENALOG) 10 MG/ML injection 10 mg  10 mg Other Once Landis Martins, DPM        Allergies  Allergen Reactions  . Atorvastatin Other (See Comments)    Cramps   . Escitalopram Oxalate Other (See Comments)    Insomnia, but patient currently takes this  . Codeine Nausea And Vomiting  . Niacin And Related Itching and Rash    Objective: Physical Exam  General: Well developed, nourished, no acute distress, awake, alert and oriented x 3  Vascular: Dorsalis pedis artery 2/4 bilateral, Posterior tibial artery 1/4 bilateral, skin temperature warm to warm proximal to  distal bilateral lower extremities, no varicosities, pedal hair present bilateral.  Trace edema bilateral.  Neurological: Gross sensation present via light touch bilateral.  Dermatological: Skin is warm, dry, and supple bilateral, Nails 1-10 are elonagted thick, and discolored with mild subungal debris with most involved nails bilateral hallux with no acute ingrowing noted, no webspace macerations present bilateral, no open lesions present bilateral, no callus/corns. + Dry skin to heels bilateral. No signs of infection bilateral.  Musculoskeletal: No symptomatic boney deformities noted bilateral. Muscular strength  within normal limits without pain on range of motion. No pain with calf compression bilateral.   Assessment and Plan:  Problem List Items Addressed This Visit   None   Visit Diagnoses    Pain due to onychomycosis of toenails of both feet    -  Primary   Diabetic polyneuropathy associated with type 2 diabetes mellitus ([place])         -Examined patient -Re-discussed foot care in the setting of diabetes and daily foot inspection -Nails x10 mechanically debrided with sterile nail nipper without incident   -Continue with daily skin emollients for dry skin -Patient to return in  2.5-3 months for follow up evaluation/Diabetic nail care or sooner if symptoms worsen.  Landis Martins, DPM

## 2020-12-11 ENCOUNTER — Ambulatory Visit (INDEPENDENT_AMBULATORY_CARE_PROVIDER_SITE_OTHER): Payer: Medicare Other | Admitting: Sports Medicine

## 2020-12-11 ENCOUNTER — Other Ambulatory Visit: Payer: Self-pay

## 2020-12-11 ENCOUNTER — Encounter: Payer: Self-pay | Admitting: Sports Medicine

## 2020-12-11 DIAGNOSIS — M79675 Pain in left toe(s): Secondary | ICD-10-CM

## 2020-12-11 DIAGNOSIS — E1142 Type 2 diabetes mellitus with diabetic polyneuropathy: Secondary | ICD-10-CM

## 2020-12-11 DIAGNOSIS — M79674 Pain in right toe(s): Secondary | ICD-10-CM

## 2020-12-11 DIAGNOSIS — M79672 Pain in left foot: Secondary | ICD-10-CM

## 2020-12-11 DIAGNOSIS — M79671 Pain in right foot: Secondary | ICD-10-CM

## 2020-12-11 DIAGNOSIS — B351 Tinea unguium: Secondary | ICD-10-CM

## 2020-12-11 NOTE — Progress Notes (Signed)
Patient ID: Deborah Weeks, female   DOB: 30-Jan-1946, 75 y.o.   MRN: [id number] Subjective: Deborah Weeks is a 75 y.o. female patient seen today in office for diabetic foot check and nail trim.  Reports that her blood sugar was not recorded and reports no changes of medication since last encounter, A1c doing really well. No other issues noted.   Patient Active Problem List   Diagnosis Date Noted  . HLD (hyperlipidemia)   . Abnormal urinalysis 10/15/2018  . Hypokalemia 10/15/2018  . Chest pain 10/15/2018  . Acute respiratory failure with hypoxia ([place]) 10/14/2018  . Recurrent major depressive disorder, in full remission ([place]) 10/28/2014  . Multinodular goiter 08/30/2012  . Allergic rhinitis 05/30/2012  . DM (diabetes mellitus) ([place]) 01/05/2011  . Controlled type 2 diabetes mellitus without complication ([place]) 95/18/8416  . Hypertension 01/04/2011  . Osteopenia 12/07/2010  . Glaucoma 02/27/2010  . 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 Systemic lupus erythematosus ([place]) 02/27/2010    Current Outpatient Medications on File Prior to Visit  Medication Sig Dispense Refill  . acetaminophen (TYLENOL) 325 MG tablet Take 2 tablets (650 mg total) by mouth every 6 (six) hours as needed for mild pain or headache (fever >/= 101).    Marland Kitchen albuterol (PROVENTIL HFA;VENTOLIN HFA) 108 (90 Base) MCG/ACT inhaler Inhale 2 puffs every 4 (four) hours as needed into the lungs for wheezing or shortness of breath. 1 Inhaler 0  . amLODipine (NORVASC) 10 MG tablet Take 1 tablet (10 mg total) by mouth daily. 30 tablet 0  . aspirin EC 81 MG tablet Take 81 mg by mouth daily.    . Blood Glucose Monitoring Suppl (ONETOUCH VERIO FLEX SYSTEM) w/Device KIT 2 (two) times daily.     . carvedilol (COREG) 25 MG tablet Take 25 mg by mouth 2 (two) times daily.     . diclofenac (VOLTAREN) 75 MG EC tablet TAKE 1 TABLET BY MOUTH TWICE A DAY (Patient not taking: Reported on 05/14/2020) 90 tablet 5  . furosemide  (LASIX) 20 MG tablet Take 1 tablet (20 mg total) by mouth 2 (two) times daily. 180 tablet 3  . Lancets (ONETOUCH DELICA PLUS [id number]) MISC 3 (three) times daily.     Marland Kitchen linagliptin (TRADJENTA) 5 MG TABS tablet Take 1 tablet (5 mg total) by mouth daily. 30 tablet 1  . LYRICA 75 MG capsule Take 75 mg by mouth 3 (three) times daily.  1  . metFORMIN (GLUCOPHAGE) 500 MG tablet Take 1,000 mg by mouth 2 (two) times daily with a meal.    . ONETOUCH VERIO test strip 3 (three) times daily.     . pravastatin (PRAVACHOL) 10 MG tablet Take 10 mg by mouth daily.      Current Facility-Administered Medications on File Prior to Visit  Medication Dose Route Frequency Provider Last Rate Last Admin  . triamcinolone acetonide (KENALOG) 10 MG/ML injection 10 mg  10 mg Other Once Landis Martins, DPM        Allergies  Allergen Reactions  . Atorvastatin Other (See Comments)    Cramps   . Escitalopram Oxalate Other (See Comments)    Insomnia, but patient currently takes this  . Codeine Nausea And Vomiting  . Niacin And Related Itching and Rash    Objective: Physical Exam  General: Well developed, nourished, no acute distress, awake, alert and oriented x 3  Vascular: Dorsalis pedis artery 2/4 bilateral, Posterior tibial artery 1/4 bilateral, skin temperature warm to warm proximal to distal bilateral  lower extremities, no varicosities, pedal hair present bilateral.  Trace edema bilateral.  Neurological: Gross sensation present via light touch bilateral.  Dermatological: Skin is warm, dry, and supple bilateral, Nails 1-10 are elonagted thick, and discolored with mild subungal debris with most involved nails bilateral hallux with no acute ingrowing noted, no webspace macerations present bilateral, no open lesions present bilateral, no callus/corns. + Dry skin to heels bilateral. No signs of infection bilateral.  Musculoskeletal: No symptomatic boney deformities noted bilateral. Muscular strength within normal  limits without pain on range of motion. No pain with calf compression bilateral.   Assessment and Plan:  Problem List Items Addressed This Visit   None   Visit Diagnoses    Pain due to onychomycosis of toenails of both feet    -  Primary   Diabetic polyneuropathy associated with type 2 diabetes mellitus ([facility])       Foot pain, bilateral         -Examined patient -Re-discussed foot care in the setting of diabetes and daily foot inspection -Nails x10 mechanically debrided with sterile nail nipper without incident   -Patient to return in  2.5-3 months for follow up evaluation/Diabetic nail care or sooner if symptoms worsen.  Landis Martins, DPM

## 2021-02-19 ENCOUNTER — Ambulatory Visit: Payer: Medicare Other | Admitting: Sports Medicine

## 2021-02-26 ENCOUNTER — Other Ambulatory Visit: Payer: Self-pay

## 2021-02-26 ENCOUNTER — Ambulatory Visit (INDEPENDENT_AMBULATORY_CARE_PROVIDER_SITE_OTHER): Payer: Medicare Other | Admitting: Sports Medicine

## 2021-02-26 ENCOUNTER — Encounter: Payer: Self-pay | Admitting: Sports Medicine

## 2021-02-26 DIAGNOSIS — M79675 Pain in left toe(s): Secondary | ICD-10-CM | POA: Diagnosis not present

## 2021-02-26 DIAGNOSIS — E1142 Type 2 diabetes mellitus with diabetic polyneuropathy: Secondary | ICD-10-CM

## 2021-02-26 DIAGNOSIS — M79674 Pain in right toe(s): Secondary | ICD-10-CM

## 2021-02-26 DIAGNOSIS — B351 Tinea unguium: Secondary | ICD-10-CM | POA: Diagnosis not present

## 2021-02-26 NOTE — Progress Notes (Signed)
Patient ID: Deborah Weeks, female   DOB: November 06, 1945, 75 y.o.   MRN: [id number] Subjective: Deborah Weeks is a 75 y.o. female patient seen today in office for diabetic foot check and nail trim.  Reports that her blood sugar was not recorded and reports no changes of medication since last encounter, A1c 7.5.  No other issues noted.   Patient Active Problem List   Diagnosis Date Noted  . HLD (hyperlipidemia)   . Abnormal urinalysis 10/15/2018  . Hypokalemia 10/15/2018  . Chest pain 10/15/2018  . Acute respiratory failure with hypoxia ([place]) 10/14/2018  . Recurrent major depressive disorder, in full remission ([place]) 10/28/2014  . Multinodular goiter 08/30/2012  . Allergic rhinitis 05/30/2012  . DM (diabetes mellitus) ([place]) 01/05/2011  . Controlled type 2 diabetes mellitus without complication ([place]) 59/56/3875  . Hypertension 01/04/2011  . Osteopenia 12/07/2010  . Glaucoma 02/27/2010  . 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 Systemic lupus erythematosus ([place]) 02/27/2010    Current Outpatient Medications on File Prior to Visit  Medication Sig Dispense Refill  . acetaminophen (TYLENOL) 325 MG tablet Take 2 tablets (650 mg total) by mouth every 6 (six) hours as needed for mild pain or headache (fever >/= 101).    Marland Kitchen albuterol (PROVENTIL HFA;VENTOLIN HFA) 108 (90 Base) MCG/ACT inhaler Inhale 2 puffs every 4 (four) hours as needed into the lungs for wheezing or shortness of breath. 1 Inhaler 0  . amLODipine (NORVASC) 10 MG tablet Take 1 tablet (10 mg total) by mouth daily. 30 tablet 0  . aspirin EC 81 MG tablet Take 81 mg by mouth daily.    . Blood Glucose Monitoring Suppl (ONETOUCH VERIO FLEX SYSTEM) w/Device KIT 2 (two) times daily.     . carvedilol (COREG) 25 MG tablet Take 25 mg by mouth 2 (two) times daily.     . diclofenac (VOLTAREN) 75 MG EC tablet TAKE 1 TABLET BY MOUTH TWICE A DAY (Patient not taking: Reported on 05/14/2020) 90 tablet 5  . furosemide (LASIX) 20 MG  tablet Take 1 tablet (20 mg total) by mouth 2 (two) times daily. 180 tablet 3  . Lancets (ONETOUCH DELICA PLUS [id number]) MISC 3 (three) times daily.     Marland Kitchen linagliptin (TRADJENTA) 5 MG TABS tablet Take 1 tablet (5 mg total) by mouth daily. 30 tablet 1  . LYRICA 75 MG capsule Take 75 mg by mouth 3 (three) times daily.  1  . metFORMIN (GLUCOPHAGE) 500 MG tablet Take 1,000 mg by mouth 2 (two) times daily with a meal.    . ONETOUCH VERIO test strip 3 (three) times daily.     . pravastatin (PRAVACHOL) 10 MG tablet Take 10 mg by mouth daily.      Current Facility-Administered Medications on File Prior to Visit  Medication Dose Route Frequency Provider Last Rate Last Admin  . triamcinolone acetonide (KENALOG) 10 MG/ML injection 10 mg  10 mg Other Once Landis Martins, DPM        Allergies  Allergen Reactions  . Atorvastatin Other (See Comments)    Cramps   . Escitalopram Oxalate Other (See Comments)    Insomnia, but patient currently takes this  . Codeine Nausea And Vomiting  . Niacin And Related Itching and Rash    Objective: Physical Exam  General: Well developed, nourished, no acute distress, awake, alert and oriented x 3  Vascular: Dorsalis pedis artery 2/4 bilateral, Posterior tibial artery 1/4 bilateral, skin temperature warm to warm proximal to distal bilateral lower  extremities, no varicosities, pedal hair present bilateral.  Trace edema bilateral.  Neurological: Gross sensation present via light touch bilateral.  Dermatological: Skin is warm, dry, and supple bilateral, Nails 1-10 are elonagted thick, and discolored with mild subungal debris with most involved nails bilateral hallux with no acute ingrowing noted, no webspace macerations present bilateral, no open lesions present bilateral, no callus/corns. + Dry skin to heels bilateral slowly improving in nature. No signs of infection bilateral.  Musculoskeletal: No symptomatic boney deformities noted bilateral. Muscular strength  within normal limits without pain on range of motion. No pain with calf compression bilateral.   Assessment and Plan:  Problem List Items Addressed This Visit   None   Visit Diagnoses    Pain due to onychomycosis of toenails of both feet    -  Primary   Diabetic polyneuropathy associated with type 2 diabetes mellitus ([place])         -Examined patient -Re-discussed foot care in the setting of diabetes and daily foot inspection -Nails x10 mechanically debrided with sterile nail nipper without incident   -Advised patient to continue with daily skin emollients for dry skin and may also try tea tree oil to nails or Vicks VapoRub -Patient to return in  2.5-3 months for follow up evaluation/Diabetic nail care or sooner if symptoms worsen.  Landis Martins, DPM

## 2021-05-07 ENCOUNTER — Ambulatory Visit (INDEPENDENT_AMBULATORY_CARE_PROVIDER_SITE_OTHER): Payer: Medicare Other | Admitting: Sports Medicine

## 2021-05-07 ENCOUNTER — Other Ambulatory Visit: Payer: Self-pay

## 2021-05-07 ENCOUNTER — Encounter: Payer: Self-pay | Admitting: Sports Medicine

## 2021-05-07 DIAGNOSIS — M79671 Pain in right foot: Secondary | ICD-10-CM

## 2021-05-07 DIAGNOSIS — M79675 Pain in left toe(s): Secondary | ICD-10-CM

## 2021-05-07 DIAGNOSIS — B351 Tinea unguium: Secondary | ICD-10-CM | POA: Diagnosis not present

## 2021-05-07 DIAGNOSIS — M79674 Pain in right toe(s): Secondary | ICD-10-CM

## 2021-05-07 DIAGNOSIS — E1142 Type 2 diabetes mellitus with diabetic polyneuropathy: Secondary | ICD-10-CM

## 2021-05-07 DIAGNOSIS — M79672 Pain in left foot: Secondary | ICD-10-CM

## 2021-05-07 NOTE — Progress Notes (Signed)
Patient ID: Deborah Weeks, female   DOB: October 27, 1945, 75 y.o.   MRN: [id number]  Subjective: Deborah Weeks is a 75 y.o. female patient seen today in office for diabetic nail care. Reports that her blood sugar was not recorded and reports no changes of medication since last encounter, A1c 7.1  No other issues noted.   Patient Active Problem List   Diagnosis Date Noted   HLD (hyperlipidemia)    Abnormal urinalysis 10/15/2018   Hypokalemia 10/15/2018   Chest pain 10/15/2018   Acute respiratory failure with hypoxia ([place]) 10/14/2018   Recurrent major depressive disorder, in full remission ([place]) 10/28/2014   Multinodular goiter 08/30/2012   Allergic rhinitis 05/30/2012   DM (diabetes mellitus) ([place]) 01/05/2011   Controlled type 2 diabetes mellitus without complication ([place]) 40/98/1191   Hypertension 01/04/2011   Osteopenia 12/07/2010   Glaucoma 02/27/2010   History of neoplasm of bladder 02/27/2010   Systemic lupus erythematosus ([place]) 02/27/2010    Current Outpatient Medications on File Prior to Visit  Medication Sig Dispense Refill   acetaminophen (TYLENOL) 325 MG tablet Take 2 tablets (650 mg total) by mouth every 6 (six) hours as needed for mild pain or headache (fever >/= 101).     albuterol (PROVENTIL HFA;VENTOLIN HFA) 108 (90 Base) MCG/ACT inhaler Inhale 2 puffs every 4 (four) hours as needed into the lungs for wheezing or shortness of breath. 1 Inhaler 0   amLODipine (NORVASC) 10 MG tablet Take 1 tablet (10 mg total) by mouth daily. 30 tablet 0   aspirin EC 81 MG tablet Take 81 mg by mouth daily.     Blood Glucose Monitoring Suppl (ONETOUCH VERIO FLEX SYSTEM) w/Device KIT 2 (two) times daily.      busPIRone (BUSPAR) 5 MG tablet Take 5 mg by mouth 2 (two) times daily.     carvedilol (COREG) 25 MG tablet Take 25 mg by mouth 2 (two) times daily.      diclofenac (VOLTAREN) 75 MG EC tablet TAKE 1 TABLET BY MOUTH TWICE A DAY 90 tablet 5   furosemide (LASIX) 20 MG tablet Take  1 tablet (20 mg total) by mouth 2 (two) times daily. 180 tablet 3   Lancets (ONETOUCH DELICA PLUS [id number]) MISC 3 (three) times daily.      linagliptin (TRADJENTA) 5 MG TABS tablet Take 1 tablet (5 mg total) by mouth daily. 30 tablet 1   LYRICA 75 MG capsule Take 75 mg by mouth 3 (three) times daily.  1   metFORMIN (GLUCOPHAGE) 500 MG tablet Take 1,000 mg by mouth 2 (two) times daily with a meal.     ONETOUCH VERIO test strip 3 (three) times daily.      pravastatin (PRAVACHOL) 10 MG tablet Take 10 mg by mouth daily.      pregabalin (LYRICA) 25 MG capsule SMARTSIG:1 Capsule(s) By Mouth PRN     Current Facility-Administered Medications on File Prior to Visit  Medication Dose Route Frequency Provider Last Rate Last Admin   triamcinolone acetonide (KENALOG) 10 MG/ML injection 10 mg  10 mg Other Once Landis Martins, DPM        Allergies  Allergen Reactions   Atorvastatin Other (See Comments)    Cramps    Escitalopram Oxalate Other (See Comments)    Insomnia, but patient currently takes this   Codeine Nausea And Vomiting   Niacin And Related Itching and Rash    Objective: Physical Exam  General: Well developed, nourished, no acute distress, awake, alert and oriented x  3  Vascular: Dorsalis pedis artery 2/4 bilateral, Posterior tibial artery 1/4 bilateral, skin temperature warm to warm proximal to distal bilateral lower extremities, no varicosities, pedal hair present bilateral.  Trace edema bilateral.  Neurological: Gross sensation present via light touch bilateral.  Dermatological: Skin is warm, dry, and supple bilateral, Nails 1-10 are elonagted thick, and discolored with mild subungal debris with most involved nails bilateral hallux with no acute ingrowing noted, no webspace macerations present bilateral, no open lesions present bilateral, no callus/corns. Dry skin plantar surfaces of both feet, improving in nature. No signs of infection bilateral.  Musculoskeletal: No symptomatic  boney deformities noted bilateral. Muscular strength within normal limits without pain on range of motion. No pain with calf compression bilateral.   Assessment and Plan:  Problem List Items Addressed This Visit   None Visit Diagnoses     Pain due to onychomycosis of toenails of both feet    -  Primary   Diabetic polyneuropathy associated with type 2 diabetes mellitus (HCC)       Foot pain, bilateral          -Examined patient -Re-discussed foot care in the setting of diabetes and daily foot inspection -Nails x10 mechanically debrided with sterile nail nipper without incident   -Advised patient to continue with daily skin emollients for dry skin and may also try tea tree oil to nails or Vicks VapoRub like before -Patient to return in  2.5-3 months for follow up evaluation/Diabetic nail care or sooner if symptoms worsen.  Landis Martins, DPM

## 2021-05-18 ENCOUNTER — Other Ambulatory Visit: Payer: Self-pay | Admitting: Family Medicine

## 2021-05-18 DIAGNOSIS — Z1231 Encounter for screening mammogram for malignant neoplasm of breast: Secondary | ICD-10-CM

## 2021-05-20 ENCOUNTER — Ambulatory Visit
Admission: RE | Admit: 2021-05-20 | Discharge: 2021-05-20 | Disposition: A | Discharge status: Home / Self Care | Payer: Medicare Other | Source: Ambulatory Visit | Attending: Family Medicine | Admitting: Family Medicine

## 2021-05-20 ENCOUNTER — Other Ambulatory Visit: Payer: Self-pay

## 2021-05-20 DIAGNOSIS — Z1231 Encounter for screening mammogram for malignant neoplasm of breast: Secondary | ICD-10-CM

## 2021-06-16 DIAGNOSIS — E118 Type 2 diabetes mellitus with unspecified complications: Secondary | ICD-10-CM | POA: Diagnosis not present

## 2021-06-16 DIAGNOSIS — I10 Essential (primary) hypertension: Secondary | ICD-10-CM | POA: Diagnosis not present

## 2021-06-16 DIAGNOSIS — E7800 Pure hypercholesterolemia, unspecified: Secondary | ICD-10-CM | POA: Diagnosis not present

## 2021-06-16 DIAGNOSIS — Z23 Encounter for immunization: Secondary | ICD-10-CM | POA: Diagnosis not present

## 2021-06-25 DIAGNOSIS — J441 Chronic obstructive pulmonary disease with (acute) exacerbation: Secondary | ICD-10-CM | POA: Diagnosis not present

## 2021-07-23 DIAGNOSIS — E7800 Pure hypercholesterolemia, unspecified: Secondary | ICD-10-CM | POA: Diagnosis not present

## 2021-07-23 DIAGNOSIS — I10 Essential (primary) hypertension: Secondary | ICD-10-CM | POA: Diagnosis not present

## 2021-07-23 DIAGNOSIS — E118 Type 2 diabetes mellitus with unspecified complications: Secondary | ICD-10-CM | POA: Diagnosis not present

## 2021-07-25 DIAGNOSIS — J441 Chronic obstructive pulmonary disease with (acute) exacerbation: Secondary | ICD-10-CM | POA: Diagnosis not present

## 2021-08-13 ENCOUNTER — Other Ambulatory Visit: Payer: Self-pay

## 2021-08-13 ENCOUNTER — Encounter: Payer: Self-pay | Admitting: Sports Medicine

## 2021-08-13 ENCOUNTER — Ambulatory Visit (INDEPENDENT_AMBULATORY_CARE_PROVIDER_SITE_OTHER): Payer: Medicare Other | Admitting: Sports Medicine

## 2021-08-13 DIAGNOSIS — M79672 Pain in left foot: Secondary | ICD-10-CM

## 2021-08-13 DIAGNOSIS — E1142 Type 2 diabetes mellitus with diabetic polyneuropathy: Secondary | ICD-10-CM | POA: Diagnosis not present

## 2021-08-13 DIAGNOSIS — M79671 Pain in right foot: Secondary | ICD-10-CM

## 2021-08-13 DIAGNOSIS — M79674 Pain in right toe(s): Secondary | ICD-10-CM | POA: Diagnosis not present

## 2021-08-13 DIAGNOSIS — B351 Tinea unguium: Secondary | ICD-10-CM | POA: Diagnosis not present

## 2021-08-13 DIAGNOSIS — M79675 Pain in left toe(s): Secondary | ICD-10-CM

## 2021-08-13 NOTE — Progress Notes (Signed)
Patient ID: Deborah Weeks, female   DOB: [birth year], 75 y.o.   MRN: [id number]  Subjective: Deborah Weeks is a 75 y.o. female patient seen today in office for diabetic nail care. Reports that she was started on a new blood pressure medication. No other issues noted.   Patient Active Problem List   Diagnosis Date Noted   HLD (hyperlipidemia)    Abnormal urinalysis 10/15/2018   Hypokalemia 10/15/2018   Chest pain 10/15/2018   Acute respiratory failure with hypoxia ([place]) 10/14/2018   Recurrent major depressive disorder, in full remission ([place]) 10/28/2014   Multinodular goiter 08/30/2012   Allergic rhinitis 05/30/2012   DM (diabetes mellitus) ([place]) 01/05/2011   Controlled type 2 diabetes mellitus without complication ([place]) 91/63/8466   Hypertension 01/04/2011   Osteopenia 12/07/2010   Glaucoma 02/27/2010   History of neoplasm of bladder 02/27/2010   Systemic lupus erythematosus ([place]) 02/27/2010    Current Outpatient Medications on File Prior to Visit  Medication Sig Dispense Refill   acetaminophen (TYLENOL) 325 MG tablet Take 2 tablets (650 mg total) by mouth every 6 (six) hours as needed for mild pain or headache (fever >/= 101).     albuterol (PROVENTIL HFA;VENTOLIN HFA) 108 (90 Base) MCG/ACT inhaler Inhale 2 puffs every 4 (four) hours as needed into the lungs for wheezing or shortness of breath. 1 Inhaler 0   amLODipine (NORVASC) 10 MG tablet Take 1 tablet (10 mg total) by mouth daily. 30 tablet 0   aspirin EC 81 MG tablet Take 81 mg by mouth daily.     Blood Glucose Monitoring Suppl (ONETOUCH VERIO FLEX SYSTEM) w/Device KIT 2 (two) times daily.      busPIRone (BUSPAR) 5 MG tablet Take 5 mg by mouth 2 (two) times daily.     carvedilol (COREG) 25 MG tablet Take 25 mg by mouth 2 (two) times daily.      diclofenac (VOLTAREN) 75 MG EC tablet TAKE 1 TABLET BY MOUTH TWICE A DAY 90 tablet 5   furosemide (LASIX) 20 MG tablet Take 1 tablet (20 mg total) by mouth 2 (two) times  daily. 180 tablet 3   irbesartan-hydrochlorothiazide (AVALIDE) 300-12.5 MG tablet Take 1 tablet by mouth daily.     Lancets (ONETOUCH DELICA PLUS [id number]) MISC 3 (three) times daily.      linagliptin (TRADJENTA) 5 MG TABS tablet Take 1 tablet (5 mg total) by mouth daily. 30 tablet 1   losartan-hydrochlorothiazide (HYZAAR) 100-25 MG tablet Take 1 tablet by mouth daily.     LYRICA 75 MG capsule Take 75 mg by mouth 3 (three) times daily.  1   metFORMIN (GLUCOPHAGE) 500 MG tablet Take 1,000 mg by mouth 2 (two) times daily with a meal.     ONETOUCH VERIO test strip 3 (three) times daily.      pravastatin (PRAVACHOL) 20 MG tablet Take 20 mg by mouth daily.     pregabalin (LYRICA) 25 MG capsule SMARTSIG:1 Capsule(s) By Mouth PRN     Current Facility-Administered Medications on File Prior to Visit  Medication Dose Route Frequency Provider Last Rate Last Admin   triamcinolone acetonide (KENALOG) 10 MG/ML injection 10 mg  10 mg Other Once Landis Martins, DPM        Allergies  Allergen Reactions   Atorvastatin Other (See Comments)    Cramps    Escitalopram Oxalate Other (See Comments)    Insomnia, but patient currently takes this   Codeine Nausea And Vomiting   Niacin And Related Itching and  Rash    Objective: Physical Exam  General: Well developed, nourished, no acute distress, awake, alert and oriented x 3  Vascular: Dorsalis pedis artery 2/4 bilateral, Posterior tibial artery 1/4 bilateral, skin temperature warm to warm proximal to distal bilateral lower extremities, no varicosities, pedal hair present bilateral.  Trace edema bilateral.  Neurological: Gross sensation present via light touch bilateral.  Dermatological: Skin is warm, dry, and supple bilateral, Nails 1-10 are elonagted thick, and discolored with mild subungal debris with most involved nails bilateral hallux with no acute ingrowing noted, no webspace macerations present bilateral, no open lesions present bilateral, no  callus/corns. Dry skin plantar surfaces of both feet, improving in nature. No signs of infection bilateral.  Musculoskeletal: No symptomatic boney deformities noted bilateral. Muscular strength within normal limits without pain on range of motion. No pain with calf compression bilateral.   Assessment and Plan:  Problem List Items Addressed This Visit   None Visit Diagnoses     Pain due to onychomycosis of toenails of both feet    -  Primary   Diabetic polyneuropathy associated with type 2 diabetes mellitus (HCC)       Relevant Medications   irbesartan-hydrochlorothiazide (AVALIDE) 300-12.5 MG tablet   losartan-hydrochlorothiazide (HYZAAR) 100-25 MG tablet   pravastatin (PRAVACHOL) 20 MG tablet   Foot pain, bilateral          -Examined patient -Re-discussed foot care in the setting of diabetes and daily foot inspection -Nails x10 mechanically debrided with sterile nail nipper without incident   -Patient to return in  2.5-3 months for follow up evaluation/Diabetic nail care or sooner if symptoms worsen.  Landis Martins, DPM

## 2021-08-25 DIAGNOSIS — J441 Chronic obstructive pulmonary disease with (acute) exacerbation: Secondary | ICD-10-CM | POA: Diagnosis not present

## 2021-09-02 DIAGNOSIS — E7800 Pure hypercholesterolemia, unspecified: Secondary | ICD-10-CM | POA: Diagnosis not present

## 2021-09-02 DIAGNOSIS — I10 Essential (primary) hypertension: Secondary | ICD-10-CM | POA: Diagnosis not present

## 2021-09-02 DIAGNOSIS — E118 Type 2 diabetes mellitus with unspecified complications: Secondary | ICD-10-CM | POA: Diagnosis not present

## 2021-09-24 DIAGNOSIS — J441 Chronic obstructive pulmonary disease with (acute) exacerbation: Secondary | ICD-10-CM | POA: Diagnosis not present

## 2021-09-29 DIAGNOSIS — E7800 Pure hypercholesterolemia, unspecified: Secondary | ICD-10-CM | POA: Diagnosis not present

## 2021-09-29 DIAGNOSIS — I10 Essential (primary) hypertension: Secondary | ICD-10-CM | POA: Diagnosis not present

## 2021-09-29 DIAGNOSIS — Z7984 Long term (current) use of oral hypoglycemic drugs: Secondary | ICD-10-CM | POA: Diagnosis not present

## 2021-09-29 DIAGNOSIS — E118 Type 2 diabetes mellitus with unspecified complications: Secondary | ICD-10-CM | POA: Diagnosis not present

## 2021-09-29 DIAGNOSIS — E538 Deficiency of other specified B group vitamins: Secondary | ICD-10-CM | POA: Diagnosis not present

## 2021-10-08 DIAGNOSIS — E875 Hyperkalemia: Secondary | ICD-10-CM | POA: Diagnosis not present

## 2021-10-09 ENCOUNTER — Other Ambulatory Visit: Payer: Self-pay | Admitting: Family Medicine

## 2021-10-09 DIAGNOSIS — R944 Abnormal results of kidney function studies: Secondary | ICD-10-CM

## 2021-10-21 ENCOUNTER — Ambulatory Visit
Admission: RE | Admit: 2021-10-21 | Discharge: 2021-10-21 | Disposition: A | Discharge status: Home / Self Care | Payer: Commercial Managed Care - HMO | Source: Ambulatory Visit | Attending: Family Medicine | Admitting: Family Medicine

## 2021-10-21 DIAGNOSIS — R944 Abnormal results of kidney function studies: Secondary | ICD-10-CM

## 2021-10-21 DIAGNOSIS — N200 Calculus of kidney: Secondary | ICD-10-CM | POA: Diagnosis not present

## 2021-10-21 DIAGNOSIS — I129 Hypertensive chronic kidney disease with stage 1 through stage 4 chronic kidney disease, or unspecified chronic kidney disease: Secondary | ICD-10-CM | POA: Diagnosis not present

## 2021-10-25 DIAGNOSIS — J441 Chronic obstructive pulmonary disease with (acute) exacerbation: Secondary | ICD-10-CM | POA: Diagnosis not present

## 2021-10-30 DIAGNOSIS — R944 Abnormal results of kidney function studies: Secondary | ICD-10-CM | POA: Diagnosis not present

## 2021-11-16 DIAGNOSIS — E871 Hypo-osmolality and hyponatremia: Secondary | ICD-10-CM | POA: Diagnosis not present

## 2021-11-19 ENCOUNTER — Other Ambulatory Visit: Payer: Self-pay

## 2021-11-19 ENCOUNTER — Ambulatory Visit (INDEPENDENT_AMBULATORY_CARE_PROVIDER_SITE_OTHER): Payer: Medicare Other | Admitting: Sports Medicine

## 2021-11-19 ENCOUNTER — Encounter: Payer: Self-pay | Admitting: Sports Medicine

## 2021-11-19 DIAGNOSIS — M79675 Pain in left toe(s): Secondary | ICD-10-CM

## 2021-11-19 DIAGNOSIS — M79674 Pain in right toe(s): Secondary | ICD-10-CM

## 2021-11-19 DIAGNOSIS — E1142 Type 2 diabetes mellitus with diabetic polyneuropathy: Secondary | ICD-10-CM

## 2021-11-19 DIAGNOSIS — B351 Tinea unguium: Secondary | ICD-10-CM

## 2021-11-19 NOTE — Progress Notes (Signed)
Patient ID: [name], female   DOB: 01/14/1946, 76 y.o.   MRN: [id number]  Subjective: Tymesha Ditmore is a 76 y.o. female patient seen today in office for diabetic nail care.  Reports that she is doing good denies any other pedal complaints or concerns at this time. PCP Dr. Mannie Stabile last month A1c 6.4 same as before FBS unknown    Patient Active Problem List   Diagnosis Date Noted   HLD (hyperlipidemia)    Abnormal urinalysis 10/15/2018   Hypokalemia 10/15/2018   Chest pain 10/15/2018   Acute respiratory failure with hypoxia ([place]) 10/14/2018   Recurrent major depressive disorder, in full remission ([place]) 10/28/2014   Multinodular goiter 08/30/2012   Allergic rhinitis 05/30/2012   DM (diabetes mellitus) ([place]) 01/05/2011   Controlled type 2 diabetes mellitus without complication ([place]) 54/27/0623   Hypertension 01/04/2011   Osteopenia 12/07/2010   Glaucoma 02/27/2010   History of neoplasm of bladder 02/27/2010   Systemic lupus erythematosus ([place]) 02/27/2010    Current Outpatient Medications on File Prior to Visit  Medication Sig Dispense Refill   Cyanocobalamin (VITAMIN B12) 1000 MCG TBCR 1 tablet     fenofibrate (TRICOR) 145 MG tablet 1 tablet     pregabalin (LYRICA) 75 MG capsule 1 capsule     acetaminophen (TYLENOL) 325 MG tablet Take 2 tablets (650 mg total) by mouth every 6 (six) hours as needed for mild pain or headache (fever >/= 101).     acetaminophen (TYLENOL) 500 MG tablet 1 tablet as needed     albuterol (PROVENTIL HFA;VENTOLIN HFA) 108 (90 Base) MCG/ACT inhaler Inhale 2 puffs every 4 (four) hours as needed into the lungs for wheezing or shortness of breath. 1 Inhaler 0   amLODipine (NORVASC) 10 MG tablet Take 1 tablet (10 mg total) by mouth daily. 30 tablet 0   aspirin 81 MG EC tablet 1 tablet     aspirin EC 81 MG tablet Take 81 mg by mouth daily.     Blood Glucose Monitoring Suppl (ONETOUCH VERIO FLEX SYSTEM) w/Device KIT 2 (two) times daily.       busPIRone (BUSPAR) 5 MG tablet Take 5 mg by mouth 2 (two) times daily.     carvedilol (COREG) 25 MG tablet Take 25 mg by mouth 2 (two) times daily.      diclofenac (VOLTAREN) 75 MG EC tablet TAKE 1 TABLET BY MOUTH TWICE A DAY 90 tablet 5   fenofibrate (TRICOR) 145 MG tablet Take 145 mg by mouth daily.     furosemide (LASIX) 20 MG tablet Take 1 tablet (20 mg total) by mouth 2 (two) times daily. 180 tablet 3   hydrALAZINE (APRESOLINE) 25 MG tablet Take 25 mg by mouth 2 (two) times daily.     hydrALAZINE (APRESOLINE) 25 MG tablet TAKE 1 TABLET BY MOUTH TWICE A DAY WITH FOOD FOR 30 DAYS     irbesartan-hydrochlorothiazide (AVALIDE) 300-12.5 MG tablet Take 1 tablet by mouth daily.     Lancets (ONETOUCH DELICA PLUS [id number]) MISC 3 (three) times daily.      linagliptin (TRADJENTA) 5 MG TABS tablet Take 1 tablet (5 mg total) by mouth daily. 30 tablet 1   losartan-hydrochlorothiazide (HYZAAR) 100-25 MG tablet Take 1 tablet by mouth daily.     LYRICA 75 MG capsule Take 75 mg by mouth 3 (three) times daily.  1   metFORMIN (GLUCOPHAGE) 500 MG tablet Take 1,000 mg by mouth 2 (two) times daily with a meal.     metFORMIN (  GLUCOPHAGE) 500 MG tablet 1 tablet with a meal     ONETOUCH VERIO test strip 3 (three) times daily.      pravastatin (PRAVACHOL) 20 MG tablet Take 20 mg by mouth daily.     pregabalin (LYRICA) 25 MG capsule SMARTSIG:1 Capsule(s) By Mouth PRN     spironolactone (ALDACTONE) 25 MG tablet Take 25 mg by mouth every morning.     Current Facility-Administered Medications on File Prior to Visit  Medication Dose Route Frequency Provider Last Rate Last Admin   triamcinolone acetonide (KENALOG) 10 MG/ML injection 10 mg  10 mg Other Once Landis Martins, DPM        Allergies  Allergen Reactions   Atorvastatin Other (See Comments)    Cramps    Escitalopram Oxalate Other (See Comments)    Insomnia, but patient currently takes this   Codeine Nausea And Vomiting   Niacin And Related Itching and  Rash    Objective: Physical Exam  General: Well developed, nourished, no acute distress, awake, alert and oriented x 3  Vascular: Dorsalis pedis artery 2/4 bilateral, Posterior tibial artery 1/4 bilateral, skin temperature warm to warm proximal to distal bilateral lower extremities, no varicosities, pedal hair present bilateral.  Trace edema bilateral, unchanged from prior.  Neurological: Gross sensation present via light touch bilateral.  Dermatological: Skin is warm, dry, and supple bilateral, Nails 1-10 are elonagted thick, and discolored with mild subungal debris with most involved nails bilateral hallux with no acute ingrowing noted, no webspace macerations present bilateral, no open lesions present bilateral, no callus/corns. Dry skin plantar surfaces of both feet, improving in nature. No signs of infection bilateral.  Musculoskeletal: No symptomatic boney deformities noted bilateral. Muscular strength within normal limits without pain on range of motion. No pain with calf compression bilateral.   Assessment and Plan:  Problem List Items Addressed This Visit   None Visit Diagnoses     Pain due to onychomycosis of toenails of both feet    -  Primary   Diabetic polyneuropathy associated with type 2 diabetes mellitus (HCC)       Relevant Medications   aspirin 81 MG EC tablet   metFORMIN (GLUCOPHAGE) 500 MG tablet   pregabalin (LYRICA) 75 MG capsule      -Examined patient -Discussed the importance of daily foot inspection in the setting of diabetes -Painful mycotic nails x10 mechanically debrided with sterile nail nipper without incident   -Advised patient to try Vicks vapor to thickened nails as directed -Patient to return in  2.5-3 months for follow up evaluation/Diabetic nail care or sooner if symptoms worsen.  Landis Martins, DPM

## 2021-12-08 DIAGNOSIS — N390 Urinary tract infection, site not specified: Secondary | ICD-10-CM | POA: Diagnosis not present

## 2021-12-08 DIAGNOSIS — N179 Acute kidney failure, unspecified: Secondary | ICD-10-CM | POA: Diagnosis not present

## 2021-12-08 DIAGNOSIS — E875 Hyperkalemia: Secondary | ICD-10-CM | POA: Diagnosis not present

## 2021-12-08 DIAGNOSIS — E1122 Type 2 diabetes mellitus with diabetic chronic kidney disease: Secondary | ICD-10-CM | POA: Diagnosis not present

## 2021-12-08 DIAGNOSIS — R82998 Other abnormal findings in urine: Secondary | ICD-10-CM | POA: Diagnosis not present

## 2021-12-08 DIAGNOSIS — N1832 Chronic kidney disease, stage 3b: Secondary | ICD-10-CM | POA: Diagnosis not present

## 2021-12-08 DIAGNOSIS — I129 Hypertensive chronic kidney disease with stage 1 through stage 4 chronic kidney disease, or unspecified chronic kidney disease: Secondary | ICD-10-CM | POA: Diagnosis not present

## 2021-12-08 DIAGNOSIS — N200 Calculus of kidney: Secondary | ICD-10-CM | POA: Diagnosis not present

## 2021-12-14 DIAGNOSIS — R0602 Shortness of breath: Secondary | ICD-10-CM | POA: Diagnosis not present

## 2021-12-14 DIAGNOSIS — R0789 Other chest pain: Secondary | ICD-10-CM | POA: Diagnosis not present

## 2021-12-20 NOTE — Progress Notes (Unsigned)
Cardiology Office Note:    Date:  12/20/2021   ID:  Deborah Weeks, DOB 1946-04-12, MRN [id number]  PCP:  Buzzy Han, MD   [facility] Providers Cardiologist:  Candee Furbish, MD { Click to update primary MD,subspecialty MD or APP then REFRESH:1}    Referring MD: Scifres, Durel Salts   General cardiology follow up   History of Present Illness:    Deborah Weeks is a 76 y.o. female with a hx of HTN, HLD DMT2 with neuropathy, lupus, chronic diastolic CHF, and PAF who is being seen 12/20/2021 for general cardiology follow up.  [name] is followed by Dr. Marlou Porch and was last seen in 10/2019.  The patient had a nuclear stress test in 07/2018.  EF 71% with no wall motion abnormalities.  The study was felt to be normal with no significant perfusion defects or ischemia.    She was admitted to the hospital in 10/2018 with sepsis/acute respiratory failure with hypoxia secondary to lobar pneumonia.  She also had acute on chronic diastolic heart failure and was diuresed with IV Lasix as well as paroxysmal atrial fibrillation.  She was not started on anticoagulation due to an episode of hemoptysis during this admission.  Echocardiogram showed EF 60 to 65%, indeterminate diastolic function and trivial tricuspid regurgitation.   She wore a heart monitor after this admission which showed no atrial fibrillation.  Since the patient had an isolated episode of A-fib in the setting of acute illness with no evidence for recurrence, it was decided to defer long-term oral anticoagulation.  She was admitted in 10/2019 with COVID and treated with remdesivir.  Today she presents to clinic for follow-up  Past Medical History:  Diagnosis Date   Arthritis    Diabetes mellitus without complication ([place])    High cholesterol    Hypertension    Lupus ([place])    Neuropathy     Past Surgical History:  Procedure Laterality Date   BACK SURGERY     LAPAROSCOPIC TUBAL LIGATION       Current Medications: No outpatient medications have been marked as taking for the 12/24/21 encounter (Appointment) with Eileen Stanford, PA-C.   Current Facility-Administered Medications for the 12/24/21 encounter (Appointment) with Eileen Stanford, PA-C  Medication   triamcinolone acetonide (KENALOG) 10 MG/ML injection 10 mg     Allergies:   Atorvastatin, Escitalopram oxalate, Codeine, and Niacin and related   Social History   Socioeconomic History   Marital status: Single    Spouse name: Not on file   Number of children: Not on file   Years of education: Not on file   Highest education level: Not on file  Occupational History   Not on file  Tobacco Use   Smoking status: Former   Smokeless tobacco: Never  Vaping Use   Vaping Use: Never used  Substance and Sexual Activity   Alcohol use: Never    Alcohol/week: 0.0 standard drinks   Drug use: Never   Sexual activity: Not on file  Other Topics Concern   Not on file  Social History Narrative   Not on file   Social Determinants of Health   Financial Resource Strain: Not on file  Food Insecurity: Not on file  Transportation Needs: Not on file  Physical Activity: Not on file  Stress: Not on file  Social Connections: Not on file     Family History: The patient's family history includes Breast cancer in her cousin.  ROS:   Please see  the history of present illness.    All other systems reviewed and are negative.  EKGs/Labs/Other Studies Reviewed:    The following studies were reviewed today:   Echocardiogram 10/17/2018: - Left ventricle: The cavity size was normal. Systolic function was   normal. The estimated ejection fraction was in the range of 60%   to 65%. Wall motion was normal; there were no regional wall   motion abnormalities. The study is not technically sufficient to   allow evaluation of LV diastolic function. - Aortic valve: Trileaflet; normal thickness, mildly calcified   leaflets. -  Tricuspid valve: There was trivial regurgitation. - Pulmonary arteries: Systolic pressure could not be accurately   estimated.   Nuclear stress test 08/08/2018: Nuclear stress EF: 71%. No wall motion abnormalities There was no ST segment deviation noted during stress. The study is normal. There are no significant perfusion defects or ischemia identified. This is a low risk study.  Event monitor: 2/11-3/16/20 Study Highlights    Normal sinus rhythm Rare premature atrial contractions. (PAC's) Short episode of ventricular bigminy (PVC"s) No pauses No atrial fibrillation   Overall reassuring.  EKG:  EKG is *** ordered today.  The ekg ordered today demonstrates ***  Recent Labs: No results found for requested labs within last 8760 hours.  Recent Lipid Panel    Component Value Date/Time   CHOL 167 09/22/2019 2009   TRIG 147 09/22/2019 2009   HDL 28 (L) 09/22/2019 2009   CHOLHDL 6.0 09/22/2019 2009   VLDL 29 09/22/2019 2009   LDLCALC 110 (H) 09/22/2019 2009     Risk Assessment/Calculations:   {Does this patient have ATRIAL FIBRILLATION?:[id number]}       Physical Exam:    VS:  LMP  (LMP Unknown)     Wt Readings from Last 3 Encounters:  11/02/19 208 lb (94.3 kg)  09/22/19 213 lb 13.5 oz (97 kg)  11/01/18 207 lb 6.4 oz (94.1 kg)     GEN: *** Well nourished, well developed in no acute distress HEENT: Normal NECK: No JVD; No carotid bruits LYMPHATICS: No lymphadenopathy CARDIAC: ***RRR, no murmurs, rubs, gallops RESPIRATORY:  Clear to auscultation without rales, wheezing or rhonchi  ABDOMEN: Soft, non-tender, non-distended MUSCULOSKELETAL:  No edema; No deformity  SKIN: Warm and dry NEUROLOGIC:  Alert and oriented x 3 PSYCHIATRIC:  Normal affect   ASSESSMENT:    1. Chronic diastolic CHF (congestive heart failure) ([place])   2. Essential hypertension   3. Neuropathy   4. PAF (paroxysmal atrial fibrillation) (HCC)    PLAN:    In order of problems listed  above:  Chronic diastolic CHF:  HTN:  Neuropathy:   PAF:      {Are you ordering a CV Procedure (e.g. stress test, cath, DCCV, TEE, etc)?   Press F2        :[id number]}    Medication Adjustments/Labs and Tests Ordered: Current medicines are reviewed at length with the patient today.  Concerns regarding medicines are outlined above.  No orders of the defined types were placed in this encounter.  No orders of the defined types were placed in this encounter.   There are no Patient Instructions on file for this visit.   Signed, Angelena Form, PA-C  12/20/2021 2:47 PM    [facility]

## 2021-12-24 ENCOUNTER — Encounter: Payer: Self-pay | Admitting: *Deleted

## 2021-12-24 ENCOUNTER — Ambulatory Visit (INDEPENDENT_AMBULATORY_CARE_PROVIDER_SITE_OTHER): Payer: Medicare Other | Admitting: Physician Assistant

## 2021-12-24 ENCOUNTER — Ambulatory Visit
Admission: RE | Admit: 2021-12-24 | Discharge: 2021-12-24 | Disposition: A | Discharge status: Home / Self Care | Payer: Medicare Other | Source: Ambulatory Visit | Attending: Physician Assistant | Admitting: Physician Assistant

## 2021-12-24 ENCOUNTER — Encounter: Payer: Self-pay | Admitting: Physician Assistant

## 2021-12-24 ENCOUNTER — Other Ambulatory Visit: Payer: Self-pay

## 2021-12-24 VITALS — BP 140/72 | HR 67 | Ht 67.0 in | Wt 209.0 lb

## 2021-12-24 DIAGNOSIS — G629 Polyneuropathy, unspecified: Secondary | ICD-10-CM | POA: Diagnosis not present

## 2021-12-24 DIAGNOSIS — I480 Paroxysmal atrial fibrillation: Secondary | ICD-10-CM

## 2021-12-24 DIAGNOSIS — G9332 Myalgic encephalomyelitis/chronic fatigue syndrome: Secondary | ICD-10-CM | POA: Diagnosis not present

## 2021-12-24 DIAGNOSIS — R0602 Shortness of breath: Secondary | ICD-10-CM | POA: Diagnosis not present

## 2021-12-24 DIAGNOSIS — I10 Essential (primary) hypertension: Secondary | ICD-10-CM

## 2021-12-24 DIAGNOSIS — I5032 Chronic diastolic (congestive) heart failure: Secondary | ICD-10-CM

## 2021-12-24 NOTE — Patient Instructions (Signed)
Medication Instructions:  ? ?Your physician recommends that you continue on your current medications as directed. Please refer to the Current Medication list given to you today. ? ? ?*If you need a refill on your cardiac medications before your next appointment, please call your pharmacy* ? ? ?Lab Work:  CMET CBC TSH AND BNP TODAY  ? ? ?If you have labs (blood work) drawn today and your tests are completely normal, you will receive your results only by: ?MyChart Message (if you have MyChart) OR ?A paper copy in the mail ?If you have any lab test that is abnormal or we need to change your treatment, we will call you to review the results. ? ? ?Testing/Procedures: Your physician has requested that you have an echocardiogram. Echocardiography is a painless test that uses sound waves to create images of your heart. It provides your doctor with information about the size and shape of your heart and how well your heart?s chambers and valves are working. This procedure takes approximately one hour. There are no restrictions for this procedure. ? ? ?Your physician has requested that you have a lexiscan myoview. For further information please visit [email or web address]. Please follow instruction sheet, as given. ? ? ?A chest x-ray takes a picture of the organs and structures inside the chest, including the heart, lungs, and blood vessels. This test can show several things, including, whether the heart is enlarges; whether fluid is building up in the lungs; and whether pacemaker / defibrillator leads are still in place. ? ?Located in: [facility] ?Address: [address], [place], [place] [address] ?Areas served: [place] and nearby areas ?Hours:  ?Tuesday 8AM-5:30PM ?Wednesday 8AM-5:30PM ?Thursday 8AM-5:30PM ?Friday 8AM-5:30PM ?Saturday Closed ?Sunday Closed ?Monday 8AM-5:30PM ?Phone: [phone] ? ? ?Follow-Up: ?At [facility], you and your health needs are our priority.  As part of our continuing  mission to provide you with exceptional heart care, we have created designated Provider Care Teams.  These Care Teams include your primary Cardiologist (physician) and Advanced Practice Providers (APPs -  Physician Assistants and Nurse Practitioners) who all work together to provide you with the care you need, when you need it. ? ?We recommend signing up for the patient portal called "MyChart".  Sign up information is provided on this After Visit Summary.  MyChart is used to connect with patients for Virtual Visits (Telemedicine).  Patients are able to view lab/test results, encounter notes, upcoming appointments, etc.  Non-urgent messages can be sent to your provider as well.   ?To learn more about what you can do with MyChart, go to [email or web address].   ? ?Your next appointment:  You have been referred to pulmonology for shortness of  breath  ? ?                                           AND  ? ?YOU WILL BE CONTACT BACK PER KATY FOR FOLLOW UP DATE AND TIME WITH HER AFTER RESULTS ? ?The format for your next appointment:   ?In Person ? ?Provider:   ?Nell Range, PA-C  ? ? ?Other Instructions ? ?

## 2021-12-25 ENCOUNTER — Telehealth: Payer: Self-pay | Admitting: *Deleted

## 2021-12-25 NOTE — Telephone Encounter (Signed)
Spoke with patient about appointment scheduled per [facility] on 02-05-22 for follow up after testing.Patient thanked me for calling to make her aware. ?

## 2021-12-28 ENCOUNTER — Other Ambulatory Visit: Payer: Self-pay | Admitting: Physician Assistant

## 2021-12-28 LAB — CBC
Hematocrit: 35.8 % (ref 34.0–46.6)
Hemoglobin: 11.1 g/dL (ref 11.1–15.9)
MCH: 25.1 pg — ABNORMAL LOW (ref 26.6–33.0)
MCHC: 31 g/dL — ABNORMAL LOW (ref 31.5–35.7)
MCV: 81 fL (ref 79–97)
Platelets: 291 10*3/uL (ref 150–450)
RBC: 4.43 x10E6/uL (ref 3.77–5.28)
RDW: 15.3 % (ref 11.7–15.4)
WBC: 4.8 10*3/uL (ref 3.4–10.8)

## 2021-12-28 LAB — COMPREHENSIVE METABOLIC PANEL
ALT: 8 IU/L (ref 0–32)
AST: 13 IU/L (ref 0–40)
Albumin/Globulin Ratio: 1.7 (ref 1.2–2.2)
Albumin: 4.7 g/dL (ref 3.7–4.7)
Alkaline Phosphatase: 35 IU/L — ABNORMAL LOW (ref 44–121)
BUN/Creatinine Ratio: 13 (ref 12–28)
BUN: 20 mg/dL (ref 8–27)
Bilirubin Total: 0.6 mg/dL (ref 0.0–1.2)
CO2: 25 mmol/L (ref 20–29)
Calcium: 10.1 mg/dL (ref 8.7–10.3)
Chloride: 104 mmol/L (ref 96–106)
Creatinine, Ser: 1.58 mg/dL — ABNORMAL HIGH (ref 0.57–1.00)
Globulin, Total: 2.7 g/dL (ref 1.5–4.5)
Glucose: 112 mg/dL — ABNORMAL HIGH (ref 70–99)
Potassium: 5 mmol/L (ref 3.5–5.2)
Sodium: 141 mmol/L (ref 134–144)
Total Protein: 7.4 g/dL (ref 6.0–8.5)
eGFR: 34 mL/min/{1.73_m2} — ABNORMAL LOW (ref >59)

## 2021-12-28 LAB — PRO B NATRIURETIC PEPTIDE: NT-Pro BNP: 882 pg/mL — ABNORMAL HIGH (ref 0–738)

## 2021-12-28 LAB — TSH: TSH: 0.699 u[IU]/mL (ref 0.450–4.500)

## 2021-12-28 MED ORDER — FUROSEMIDE 20 MG PO TABS: 20.0000 mg | ORAL_TABLET | Freq: Every day | ORAL | 11 refills | Status: DC

## 2021-12-29 ENCOUNTER — Other Ambulatory Visit: Payer: Self-pay

## 2021-12-29 ENCOUNTER — Ambulatory Visit (INDEPENDENT_AMBULATORY_CARE_PROVIDER_SITE_OTHER): Payer: Medicare Other | Admitting: Emergency Medicine

## 2021-12-29 ENCOUNTER — Encounter: Payer: Self-pay | Admitting: Emergency Medicine

## 2021-12-29 DIAGNOSIS — R0602 Shortness of breath: Secondary | ICD-10-CM | POA: Diagnosis not present

## 2021-12-29 DIAGNOSIS — R0600 Dyspnea, unspecified: Secondary | ICD-10-CM | POA: Insufficient documentation

## 2021-12-29 NOTE — Assessment & Plan Note (Addendum)
Based on the infiltrates, pleural fluid on her chest x-ray from 3/16 and the timing of her progressive shortness of breath that coincided with adjusting her Lasix, blood pressure regimen, I suspect that a contributor to her acute dyspnea is volume overload.  She is now back on Lasix, may feel a bit better.  She has documented hypoxemia.  First noted after she had COVID-19 but now confirmed with this acute change in her status.  Of asked her to continue use 2 L/min until we have her volume optimized.  We discussed possibly doing pulmonary function testing now, other chest imaging, to evaluate her shortness of breath.  We will wait to see how she responds clinically to reinitiation of diuretics and then determine when we follow-up whether to pursue PFT, CT chest.  I did explain to her that she is at risk for interstitial lung disease given her history of SLE and the COVID-19 in 2021. ? ? ?Agree with restarting Lasix and your blood pressure medications as directed ?Use your oxygen 2 L/min when you exert yourself.  Goal is to keep your saturations > 90%.  Hopefully you will not continue to need oxygen as your breathing improves back on your usual medications. ?If you continue to have shortness of breath as we go forward, we will consider checking CT scan of the chest, pulmonary function testing to further evaluate. ?Follow with Dr. Lamonte Sakai in 6 to 8 weeks to review your status and plan any dedicated pulmonary work-up. ?

## 2021-12-29 NOTE — Progress Notes (Signed)
? ?Subjective:  ? ? Patient ID: Deborah Weeks, female    DOB: Jul 29, 1946, 76 y.o.   MRN: [id number] ? ?HPI ?76 year old former smoker (40 pack years) with a history of SLE, hypertension with diastolic CHF, paroxysmal atrial fibrillation, hyperlipidemia, diabetes with associated neuropathy.  She had COVID-19 in 10/2019 and has been on oxygen "as needed" since that time.  She has been evaluated for progressive shortness of breath at cardiology, referred for further evaluation of her dyspnea here today, question underlying lung disease.  ?She reports that she felt pretty well until about 3-4 weeks ago. She began to experience exertional sob with just getting around the house. She cannot do her household chores - was able to do these before. Her lasix was stopped about 1 month ago, and her other BP meds have been adjusted as well. She has developed some L chest heaviness over this time period as well. She has just started using her Oxygen - has not been using it. She is not on any meds for SLE at this time. She restarted the lasix yesterday - is beginning to feel better but not back to the prior baseline.  ? ?Plans are in place for a repeat echocardiogram and a nuclear stress test to rule out ischemic disease. ? ?Chest x-ray 12/24/2021 reviewed by me shows small bilateral effusions, some patchy bibasilar opacities ? ? ?Review of Systems ?As per HPI ? ?Past Medical History:  ?Diagnosis Date  ? Arthritis   ? Diabetes mellitus without complication ([place])   ? High cholesterol   ? Hypertension   ? Lupus ([place])   ? Neuropathy   ?  ? ?Family History  ?Problem Relation Age of Onset  ? Diabetes Mother   ? Hypertension Mother   ? Diabetes Father   ? Hypertension Father   ? Breast cancer Cousin   ? Alpha-1 antitrypsin deficiency Neg Hx   ? Lupus Neg Hx   ? Neurofibromatosis Neg Hx   ? Coronary artery disease Neg Hx   ?  ? ?Social History  ? ?Socioeconomic History  ? Marital status: Single  ?  Spouse name: Not on file  ? Number  of children: Not on file  ? Years of education: Not on file  ? Highest education level: Not on file  ?Occupational History  ? Not on file  ?Tobacco Use  ? Smoking status: Former  ? Smokeless tobacco: Never  ?Vaping Use  ? Vaping Use: Never used  ?Substance and Sexual Activity  ? Alcohol use: Never  ?  Alcohol/week: 0.0 standard drinks  ? Drug use: Never  ? Sexual activity: Not on file  ?Other Topics Concern  ? Not on file  ?Social History Narrative  ? Not on file  ? ?Social Determinants of Health  ? ?Financial Resource Strain: Not on file  ?Food Insecurity: Not on file  ?Transportation Needs: Not on file  ?Physical Activity: Not on file  ?Stress: Not on file  ?Social Connections: Not on file  ?Intimate Partner Violence: Not on file  ?  ? ?Allergies  ?Allergen Reactions  ? Atorvastatin Other (See Comments)  ?  Cramps ?  ? Escitalopram Oxalate Other (See Comments)  ?  Insomnia, but patient currently takes this  ? Codeine Nausea And Vomiting  ? Gabapentin Other (See Comments)  ? Niacin And Related Itching and Rash  ? Spironolactone Other (See Comments)  ?  hyperkalemia  ?  ? ?Outpatient Medications Prior to Visit  ?Medication Sig Dispense Refill  ?  acetaminophen (TYLENOL) 325 MG tablet Take 2 tablets (650 mg total) by mouth every 6 (six) hours as needed for mild pain or headache (fever >/= 101).    ? ALPRAZolam (XANAX) 0.25 MG tablet Take 0.25-0.5 mg by mouth 2 (two) times daily.    ? amLODipine (NORVASC) 10 MG tablet Take 1 tablet (10 mg total) by mouth daily. (Patient taking differently: Take 5 mg by mouth daily.) 30 tablet 0  ? aspirin 81 MG EC tablet 1 tablet    ? Blood Glucose Monitoring Suppl (ONETOUCH VERIO FLEX SYSTEM) w/Device KIT 2 (two) times daily.     ? carvedilol (COREG) 25 MG tablet Take 25 mg by mouth 2 (two) times daily.     ? Cyanocobalamin (VITAMIN B12) 1000 MCG TBCR 1 tablet    ? fenofibrate (TRICOR) 145 MG tablet Take 145 mg by mouth daily.    ? furosemide (LASIX) 20 MG tablet Take 1 tablet (20 mg  total) by mouth daily. 30 tablet 11  ? hydrALAZINE (APRESOLINE) 25 MG tablet Take 25 mg by mouth 2 (two) times daily.    ? Lancets (ONETOUCH DELICA PLUS [id number]) MISC 3 (three) times daily.     ? linagliptin (TRADJENTA) 5 MG TABS tablet Take 1 tablet (5 mg total) by mouth daily. 30 tablet 1  ? LYRICA 75 MG capsule Take 75 mg by mouth 3 (three) times daily.  1  ? ONETOUCH VERIO test strip 3 (three) times daily.     ? pravastatin (PRAVACHOL) 20 MG tablet Take 20 mg by mouth daily.    ? pregabalin (LYRICA) 25 MG capsule SMARTSIG:1 Capsule(s) By Mouth PRN    ? pregabalin (LYRICA) 75 MG capsule 1 capsule    ? ?Facility-Administered Medications Prior to Visit  ?Medication Dose Route Frequency Provider Last Rate Last Admin  ? triamcinolone acetonide (KENALOG) 10 MG/ML injection 10 mg  10 mg Other Once Landis Martins, DPM      ? ? ? ?   ?Objective:  ? Physical Exam ? ?Vitals:  ? 12/29/21 1612  ?BP: 112/72  ?Pulse: 60  ?Temp: 98.3 ?F (36.8 ?C)  ?TempSrc: Oral  ?SpO2: 92%  ?Weight: 209 lb (94.8 kg)  ?Height: 5' 7"  (1.702 m)  ? ? ?Gen: Pleasant, well-nourished, in no distress,  normal affect ? ?ENT: No lesions,  mouth clear,  oropharynx clear, no postnasal drip ? ?Neck: No JVD, no stridor ? ?Lungs: No use of accessory muscles, B insp crackles, no wheezing on normal respiration, no wheeze on forced expiration ? ?Cardiovascular: RRR, heart sounds normal, no murmur or gallops, no peripheral edema ? ?Musculoskeletal: No deformities, no cyanosis or clubbing ? ?Neuro: alert, awake, non focal ? ?Skin: Warm, no lesions or rash ? ?   ?Assessment & Plan:  ?Dyspnea ?Based on the infiltrates, pleural fluid on her chest x-ray from 3/16 and the timing of her progressive shortness of breath that coincided with adjusting her Lasix, blood pressure regimen, I suspect that a contributor to her acute dyspnea is volume overload.  She is now back on Lasix, may feel a bit better.  She has documented hypoxemia.  First noted after she had COVID-19  but now confirmed with this acute change in her status.  Of asked her to continue use 2 L/min until we have her volume optimized.  We discussed possibly doing pulmonary function testing now, other chest imaging, to evaluate her shortness of breath.  We will wait to see how she responds clinically to reinitiation of diuretics and  then determine when we follow-up whether to pursue PFT, CT chest.  I did explain to her that she is at risk for interstitial lung disease given her history of SLE and the COVID-19 in 2021. ? ? ?Agree with restarting Lasix and your blood pressure medications as directed ?Use your oxygen 2 L/min when you exert yourself.  Goal is to keep your saturations > 90%.  Hopefully you will not continue to need oxygen as your breathing improves back on your usual medications. ?If you continue to have shortness of breath as we go forward, we will consider checking CT scan of the chest, pulmonary function testing to further evaluate. ?Follow with Dr. Lamonte Sakai in 6 to 8 weeks to review your status and plan any dedicated pulmonary work-up. ? ? ?Baltazar Apo, MD, PhD ?12/30/2021, 1:54 PM ?[facility] ?[phone] or if no answer before 7:00PM call [phone] ?For any issues after 7:00PM please call eLink [phone] ? ? ?

## 2021-12-29 NOTE — Patient Instructions (Signed)
Agree with restarting Lasix and your blood pressure medications as directed ?Use your oxygen 2 L/min when you exert yourself.  Goal is to keep your saturations > 90%.  Hopefully you will not continue to need oxygen as your breathing improves back on your usual medications. ?If you continue to have shortness of breath as we go forward, we will consider checking CT scan of the chest, pulmonary function testing to further evaluate. ?Follow with Dr. Lamonte Sakai in 6 to 8 weeks to review your status and plan any dedicated pulmonary work-up. ?

## 2021-12-30 DIAGNOSIS — I10 Essential (primary) hypertension: Secondary | ICD-10-CM | POA: Diagnosis not present

## 2021-12-30 DIAGNOSIS — G629 Polyneuropathy, unspecified: Secondary | ICD-10-CM | POA: Diagnosis not present

## 2021-12-30 DIAGNOSIS — E118 Type 2 diabetes mellitus with unspecified complications: Secondary | ICD-10-CM | POA: Diagnosis not present

## 2021-12-30 DIAGNOSIS — I5032 Chronic diastolic (congestive) heart failure: Secondary | ICD-10-CM | POA: Diagnosis not present

## 2021-12-30 DIAGNOSIS — N1832 Chronic kidney disease, stage 3b: Secondary | ICD-10-CM | POA: Diagnosis not present

## 2021-12-30 DIAGNOSIS — E7800 Pure hypercholesterolemia, unspecified: Secondary | ICD-10-CM | POA: Diagnosis not present

## 2022-01-07 ENCOUNTER — Telehealth (HOSPITAL_COMMUNITY): Payer: Self-pay

## 2022-01-07 NOTE — Telephone Encounter (Signed)
Spoke with the patient, detailed instructions given. She stated that she would be here for her test. Asked to call back with any questions. S.Syaire Saber EMTP 

## 2022-01-12 ENCOUNTER — Ambulatory Visit (HOSPITAL_BASED_OUTPATIENT_CLINIC_OR_DEPARTMENT_OTHER): Payer: Medicare Other

## 2022-01-12 ENCOUNTER — Ambulatory Visit (HOSPITAL_COMMUNITY): Payer: Medicare Other | Attending: Cardiology

## 2022-01-12 DIAGNOSIS — R0602 Shortness of breath: Secondary | ICD-10-CM

## 2022-01-12 LAB — MYOCARDIAL PERFUSION IMAGING
LV dias vol: 85 mL (ref 46–106)
LV sys vol: 23 mL
Nuc Stress EF: 73 %
Peak HR: 71 {beats}/min
Rest HR: 58 {beats}/min
Rest Nuclear Isotope Dose: 10.9 mCi
SDS: 1
SRS: 6
SSS: 7
ST Depression (mm): 0 mm
Stress Nuclear Isotope Dose: 30.1 mCi
TID: 0.93

## 2022-01-12 LAB — ECHOCARDIOGRAM COMPLETE
Area-P 1/2: 5.9 cm2
Height: 67 in
MV M vel: 5.24 m/s
MV Peak grad: 109.6 mmHg
MV VTI: 3.24 cm2
S' Lateral: 2.2 cm
Weight: 3344 oz

## 2022-01-12 MED ORDER — TECHNETIUM TC 99M TETROFOSMIN IV KIT
10.9000 | PACK | Freq: Once | INTRAVENOUS | Status: AC | PRN
  Administered 2022-01-12: 10.9 via INTRAVENOUS
  Filled 2022-01-12: qty 11

## 2022-01-12 MED ORDER — REGADENOSON 0.4 MG/5ML IV SOLN
0.4000 mg | Freq: Once | INTRAVENOUS | Status: AC
  Administered 2022-01-12: 0.4 mg via INTRAVENOUS

## 2022-01-12 MED ORDER — TECHNETIUM TC 99M TETROFOSMIN IV KIT
30.1000 | PACK | Freq: Once | INTRAVENOUS | Status: AC | PRN
  Administered 2022-01-12: 30.1 via INTRAVENOUS
  Filled 2022-01-12: qty 31

## 2022-01-13 ENCOUNTER — Other Ambulatory Visit: Payer: Self-pay | Admitting: Physician Assistant

## 2022-01-13 DIAGNOSIS — I272 Pulmonary hypertension, unspecified: Secondary | ICD-10-CM

## 2022-01-15 ENCOUNTER — Ambulatory Visit: Payer: Medicare Other

## 2022-01-15 ENCOUNTER — Encounter: Payer: Self-pay | Admitting: Emergency Medicine

## 2022-01-15 ENCOUNTER — Ambulatory Visit (INDEPENDENT_AMBULATORY_CARE_PROVIDER_SITE_OTHER): Payer: Medicare Other | Admitting: Emergency Medicine

## 2022-01-15 VITALS — BP 128/80 | HR 56 | Temp 98.0°F | Ht 67.0 in | Wt 207.0 lb

## 2022-01-15 DIAGNOSIS — I2729 Other secondary pulmonary hypertension: Secondary | ICD-10-CM

## 2022-01-15 DIAGNOSIS — R0602 Shortness of breath: Secondary | ICD-10-CM | POA: Diagnosis not present

## 2022-01-15 DIAGNOSIS — J9611 Chronic respiratory failure with hypoxia: Secondary | ICD-10-CM | POA: Diagnosis not present

## 2022-01-15 DIAGNOSIS — J449 Chronic obstructive pulmonary disease, unspecified: Secondary | ICD-10-CM

## 2022-01-15 NOTE — Assessment & Plan Note (Signed)
Unclear severity.  Not on bronchodilators currently.  We will quantify with pulmonary function testing ?

## 2022-01-15 NOTE — Patient Instructions (Signed)
We will perform walking oximetry on room air today to ensure that your oxygen level is stable when you are exerting yourself. ?We will arrange for pulmonary function testing ?We will arrange for a ventilation/perfusion scan ?Continue your Lasix as you have been taking it. ?Follow with cardiology as planned including Dr. Haroldine Laws on May 2 ?Follow with Dr Lamonte Sakai in 1 month or next available ?

## 2022-01-15 NOTE — Addendum Note (Signed)
Addended by: Rosana Berger on: 01/15/2022 05:18 PM ? ? Modules accepted: Orders ? ?

## 2022-01-15 NOTE — Assessment & Plan Note (Signed)
Confirmed exertional hypoxemia today.  She needs to better compliance with supplemental O2, 2 L/min.  She would like a portable oxygen concentrator, unsure if she can get that at this time due to her existing contract with [employer] but we will work on getting a best fit system.  Perform Ono on room air ?

## 2022-01-15 NOTE — Assessment & Plan Note (Signed)
Presumed multifactorial.  Question how much diastolic dysfunction or MR may be playing a role.  Suspect other factors are large contributors.  She has a history of SLE, presumed COPD although she minimizes her pulmonary symptoms and is not on any scheduled bronchodilator therapy.  We will perform pulmonary function testing to quantify her degree of obstruction.  Consider bronchodilators going forward.  She did desaturate with exertion today and we will titrate her, suspect she will need 2 L/min.  We will also perform an overnight oximetry.  If she desaturates then she will likely need a formal sleep study.  She is planning to see Dr. Haroldine Laws in May.  I will go ahead and order the PFT, ventilation/perfusion scan in preparation for that visit.  She will continue her current diuretics. ?

## 2022-01-15 NOTE — Progress Notes (Signed)
? ?Subjective:  ? ? Patient ID: Deborah Weeks, female    DOB: 27-Apr-1946, 76 y.o.   MRN: [id number] ? ?HPI ?76 year old former smoker (40 pack years) with a history of SLE, hypertension with diastolic CHF, paroxysmal atrial fibrillation, hyperlipidemia, diabetes with associated neuropathy.  She had COVID-19 in 10/2019 and has been on oxygen "as needed" since that time.  She has been evaluated for progressive shortness of breath at cardiology, referred for further evaluation of her dyspnea here today, question underlying lung disease.  ?She reports that she felt pretty well until about 3-4 weeks ago. She began to experience exertional sob with just getting around the house. She cannot do her household chores - was able to do these before. Her lasix was stopped about 1 month ago, and her other BP meds have been adjusted as well. She has developed some L chest heaviness over this time period as well. She has just started using her Oxygen - has not been using it. She is not on any meds for SLE at this time. She restarted the lasix yesterday - is beginning to feel better but not back to the prior baseline.  ? ?Plans are in place for a repeat echocardiogram and a nuclear stress test to rule out ischemic disease. ? ?Chest x-ray 12/24/2021 reviewed by me shows small bilateral effusions, some patchy bibasilar opacities ? ? ?ROV 01/15/22 --76 year old woman, former smoker with multifactorial dyspnea.  She has a history of SLE, hyperlipidemia, diabetes.  Notably she has hypertension with diastolic CHF.  I saw her with progressive dyspnea on 3/21, chest x-ray 3/16 with increased pulmonary infiltrates, effusions.  This was right after her Lasix had been down adjusted.  Her Lasix had been restarted right around the time of her visit. ?Her eval has since revealed severe PAH as below.   ?She has some cough and wheeze when exposed to pollen, when she has a URI.  ?She feels that her breathing is back to baseline, she is able to do  housework.  ?She is not on any BD's. She is not using any O2, but has it available at home from prior acute flares.  ? ? ?Gated Lexi scan stress test 01/12/22 was a low risk study with a normal EF 73% ? ?TTE was performed 01/12/2022 and showed intact left ventricular function, a mildly enlarged RV, mild to moderate MR, severe PAH PASP 75 mmHg. ? ? ? ? ? ? ?Review of Systems ?As per HPI ? ?Past Medical History:  ?Diagnosis Date  ? Arthritis   ? Diabetes mellitus without complication ([place])   ? High cholesterol   ? Hypertension   ? Lupus ([place])   ? Neuropathy   ?  ? ?Family History  ?Problem Relation Age of Onset  ? Diabetes Mother   ? Hypertension Mother   ? Diabetes Father   ? Hypertension Father   ? Breast cancer Cousin   ? Alpha-1 antitrypsin deficiency Neg Hx   ? Lupus Neg Hx   ? Neurofibromatosis Neg Hx   ? Coronary artery disease Neg Hx   ?  ? ?Social History  ? ?Socioeconomic History  ? Marital status: Single  ?  Spouse name: Not on file  ? Number of children: Not on file  ? Years of education: Not on file  ? Highest education level: Not on file  ?Occupational History  ? Not on file  ?Tobacco Use  ? Smoking status: Former  ?  Packs/day: 1.00  ?  Years: 40.00  ?  Pack years: 40.00  ?  Types: Cigarettes  ?  Quit date: 10/11/2016  ?  Years since quitting: 5.2  ? Smokeless tobacco: Never  ?Vaping Use  ? Vaping Use: Never used  ?Substance and Sexual Activity  ? Alcohol use: Never  ?  Alcohol/week: 0.0 standard drinks  ? Drug use: Never  ? Sexual activity: Not on file  ?Other Topics Concern  ? Not on file  ?Social History Narrative  ? Not on file  ? ?Social Determinants of Health  ? ?Financial Resource Strain: Not on file  ?Food Insecurity: Not on file  ?Transportation Needs: Not on file  ?Physical Activity: Not on file  ?Stress: Not on file  ?Social Connections: Not on file  ?Intimate Partner Violence: Not on file  ?  ? ?Allergies  ?Allergen Reactions  ? Atorvastatin Other (See Comments)  ?  Cramps ?  ? Escitalopram  Oxalate Other (See Comments)  ?  Insomnia, but patient currently takes this  ? Xanax [Alprazolam] Other (See Comments)  ?  "Felt like I had a hangover", HA  ? Codeine Nausea And Vomiting  ? Gabapentin Other (See Comments)  ? Niacin And Related Itching and Rash  ? Spironolactone Other (See Comments)  ?  hyperkalemia  ?  ? ?Outpatient Medications Prior to Visit  ?Medication Sig Dispense Refill  ? acetaminophen (TYLENOL) 325 MG tablet Take 2 tablets (650 mg total) by mouth every 6 (six) hours as needed for mild pain or headache (fever >/= 101).    ? amLODipine (NORVASC) 10 MG tablet Take 1 tablet (10 mg total) by mouth daily. (Patient taking differently: Take 5 mg by mouth daily.) 30 tablet 0  ? aspirin 81 MG EC tablet 1 tablet    ? Blood Glucose Monitoring Suppl (ONETOUCH VERIO FLEX SYSTEM) w/Device KIT 2 (two) times daily.     ? carvedilol (COREG) 25 MG tablet Take 25 mg by mouth 2 (two) times daily.     ? Cyanocobalamin (VITAMIN B12) 1000 MCG TBCR 1 tablet    ? fenofibrate (TRICOR) 145 MG tablet Take 145 mg by mouth daily.    ? furosemide (LASIX) 20 MG tablet Take 1 tablet (20 mg total) by mouth daily. 30 tablet 11  ? hydrALAZINE (APRESOLINE) 25 MG tablet Take 25 mg by mouth 2 (two) times daily.    ? Lancets (ONETOUCH DELICA PLUS [id number]) MISC 3 (three) times daily.     ? linagliptin (TRADJENTA) 5 MG TABS tablet Take 1 tablet (5 mg total) by mouth daily. 30 tablet 1  ? LYRICA 75 MG capsule Take 75 mg by mouth 3 (three) times daily.  1  ? ONETOUCH VERIO test strip 3 (three) times daily.     ? pravastatin (PRAVACHOL) 20 MG tablet Take 20 mg by mouth daily.    ? pregabalin (LYRICA) 25 MG capsule SMARTSIG:1 Capsule(s) By Mouth PRN    ? pregabalin (LYRICA) 75 MG capsule 1 capsule    ? ALPRAZolam (XANAX) 0.25 MG tablet Take 0.25-0.5 mg by mouth 2 (two) times daily.    ? ?Facility-Administered Medications Prior to Visit  ?Medication Dose Route Frequency Provider Last Rate Last Admin  ? triamcinolone acetonide (KENALOG)  10 MG/ML injection 10 mg  10 mg Other Once Landis Martins, DPM      ? ? ? ?   ?Objective:  ? Physical Exam ? ?Vitals:  ? 01/15/22 1621  ?BP: 128/80  ?Pulse: (!) 56  ?Temp: 98 ?F (36.7 ?C)  ?TempSrc: Oral  ?SpO2:  97%  ?Weight: 207 lb (93.9 kg)  ?Height: 5' 7"  (1.702 m)  ? ? ?Gen: Pleasant, well-nourished, in no distress,  normal affect ? ?ENT: No lesions,  mouth clear,  oropharynx clear, no postnasal drip ? ?Neck: No JVD, no stridor ? ?Lungs: No use of accessory muscles, B insp crackles, no wheezing on normal respiration, no wheeze on forced expiration ? ?Cardiovascular: RRR, heart sounds normal, no murmur or gallops, no peripheral edema ? ?Musculoskeletal: No deformities, no cyanosis or clubbing ? ?Neuro: alert, awake, non focal ? ?Skin: Warm, no lesions or rash ? ?   ?Assessment & Plan:  ?Other secondary pulmonary hypertension ([place]) ?Presumed multifactorial.  Question how much diastolic dysfunction or MR may be playing a role.  Suspect other factors are large contributors.  She has a history of SLE, presumed COPD although she minimizes her pulmonary symptoms and is not on any scheduled bronchodilator therapy.  We will perform pulmonary function testing to quantify her degree of obstruction.  Consider bronchodilators going forward.  She did desaturate with exertion today and we will titrate her, suspect she will need 2 L/min.  We will also perform an overnight oximetry.  If she desaturates then she will likely need a formal sleep study.  She is planning to see Dr. Haroldine Laws in May.  I will go ahead and order the PFT, ventilation/perfusion scan in preparation for that visit.  She will continue her current diuretics. ? ?Chronic respiratory failure with hypoxia ([place]) ?Confirmed exertional hypoxemia today.  She needs to better compliance with supplemental O2, 2 L/min.  She would like a portable oxygen concentrator, unsure if she can get that at this time due to her existing contract with [employer] but we will work on  getting a best fit system.  Perform Ono on room air ? ?COPD (chronic obstructive pulmonary disease) ([place]) ?Unclear severity.  Not on bronchodilators currently.  We will quantify with pulmonary function testing ?

## 2022-01-20 ENCOUNTER — Telehealth: Payer: Self-pay | Admitting: Emergency Medicine

## 2022-01-20 NOTE — Telephone Encounter (Signed)
Spoke with pt and explained VQ process and what the scan is looking at. Pt stated understanding. Nothing further needed at this time.  ?

## 2022-01-21 DIAGNOSIS — G4730 Sleep apnea, unspecified: Secondary | ICD-10-CM | POA: Diagnosis not present

## 2022-01-21 DIAGNOSIS — R0683 Snoring: Secondary | ICD-10-CM | POA: Diagnosis not present

## 2022-01-27 ENCOUNTER — Other Ambulatory Visit: Payer: Self-pay

## 2022-01-27 ENCOUNTER — Ambulatory Visit (HOSPITAL_COMMUNITY)
Admission: RE | Admit: 2022-01-27 | Discharge: 2022-01-27 | Disposition: A | Discharge status: Home / Self Care | Payer: Medicare Other | Source: Ambulatory Visit | Attending: Emergency Medicine | Admitting: Emergency Medicine

## 2022-01-27 ENCOUNTER — Emergency Department (HOSPITAL_COMMUNITY)
Admission: EM | Admit: 2022-01-27 | Discharge: 2022-01-28 | Disposition: A | Discharge status: Home / Self Care | Payer: Medicare Other | Attending: Emergency Medicine | Admitting: Emergency Medicine

## 2022-01-27 ENCOUNTER — Encounter (HOSPITAL_COMMUNITY): Payer: Self-pay | Admitting: Emergency Medicine

## 2022-01-27 DIAGNOSIS — R9431 Abnormal electrocardiogram [ECG] [EKG]: Secondary | ICD-10-CM | POA: Diagnosis not present

## 2022-01-27 DIAGNOSIS — I700 Atherosclerosis of aorta: Secondary | ICD-10-CM | POA: Diagnosis not present

## 2022-01-27 DIAGNOSIS — I10 Essential (primary) hypertension: Secondary | ICD-10-CM | POA: Diagnosis not present

## 2022-01-27 DIAGNOSIS — R0602 Shortness of breath: Secondary | ICD-10-CM | POA: Insufficient documentation

## 2022-01-27 DIAGNOSIS — R1013 Epigastric pain: Secondary | ICD-10-CM | POA: Insufficient documentation

## 2022-01-27 DIAGNOSIS — R112 Nausea with vomiting, unspecified: Secondary | ICD-10-CM | POA: Insufficient documentation

## 2022-01-27 DIAGNOSIS — E119 Type 2 diabetes mellitus without complications: Secondary | ICD-10-CM | POA: Diagnosis not present

## 2022-01-27 DIAGNOSIS — Z87891 Personal history of nicotine dependence: Secondary | ICD-10-CM | POA: Diagnosis not present

## 2022-01-27 DIAGNOSIS — R109 Unspecified abdominal pain: Secondary | ICD-10-CM | POA: Diagnosis not present

## 2022-01-27 DIAGNOSIS — R0600 Dyspnea, unspecified: Secondary | ICD-10-CM | POA: Diagnosis not present

## 2022-01-27 LAB — COMPREHENSIVE METABOLIC PANEL
ALT: 11 U/L (ref 0–44)
AST: 19 U/L (ref 15–41)
Albumin: 4.1 g/dL (ref 3.5–5.0)
Alkaline Phosphatase: 30 U/L — ABNORMAL LOW (ref 38–126)
Anion gap: 10 (ref 5–15)
BUN: 17 mg/dL (ref 8–23)
CO2: 26 mmol/L (ref 22–32)
Calcium: 9.1 mg/dL (ref 8.9–10.3)
Chloride: 100 mmol/L (ref 98–111)
Creatinine, Ser: 1.87 mg/dL — ABNORMAL HIGH (ref 0.44–1.00)
GFR, Estimated: 28 mL/min — ABNORMAL LOW (ref >60)
Glucose, Bld: 205 mg/dL — ABNORMAL HIGH (ref 70–99)
Potassium: 3.4 mmol/L — ABNORMAL LOW (ref 3.5–5.1)
Sodium: 136 mmol/L (ref 135–145)
Total Bilirubin: 0.9 mg/dL (ref 0.3–1.2)
Total Protein: 7.2 g/dL (ref 6.5–8.1)

## 2022-01-27 LAB — URINALYSIS, ROUTINE W REFLEX MICROSCOPIC
Bilirubin Urine: NEGATIVE
Glucose, UA: NEGATIVE mg/dL
Hgb urine dipstick: NEGATIVE
Ketones, ur: NEGATIVE mg/dL
Nitrite: NEGATIVE
Protein, ur: NEGATIVE mg/dL
Specific Gravity, Urine: 1.012 (ref 1.005–1.030)
WBC, UA: >50 WBC/hpf — ABNORMAL HIGH (ref 0–5)
pH: 7 (ref 5.0–8.0)

## 2022-01-27 LAB — LIPASE, BLOOD: Lipase: 26 U/L (ref 11–51)

## 2022-01-27 LAB — TROPONIN I (HIGH SENSITIVITY)
Troponin I (High Sensitivity): 13 ng/L (ref <18)
Troponin I (High Sensitivity): 12 ng/L (ref <18)

## 2022-01-27 LAB — CBC
HCT: 40.1 % (ref 36.0–46.0)
Hemoglobin: 12.3 g/dL (ref 12.0–15.0)
MCH: 25.1 pg — ABNORMAL LOW (ref 26.0–34.0)
MCHC: 30.7 g/dL (ref 30.0–36.0)
MCV: 81.7 fL (ref 80.0–100.0)
Platelets: 314 10*3/uL (ref 150–400)
RBC: 4.91 MIL/uL (ref 3.87–5.11)
RDW: 14.4 % (ref 11.5–15.5)
WBC: 5.6 10*3/uL (ref 4.0–10.5)
nRBC: 0 % (ref 0.0–0.2)

## 2022-01-27 LAB — CBG MONITORING, ED: Glucose-Capillary: 221 mg/dL — ABNORMAL HIGH (ref 70–99)

## 2022-01-27 MED ORDER — TECHNETIUM TO 99M ALBUMIN AGGREGATED
4.3000 | Freq: Once | INTRAVENOUS | Status: AC
  Administered 2022-01-27: 4.3 via INTRAVENOUS

## 2022-01-27 NOTE — ED Triage Notes (Signed)
Pt c/o epigastric pain, nausea and vomiting that started this morning. Denies shortness of breath. ?

## 2022-01-27 NOTE — ED Provider Triage Note (Signed)
Emergency Medicine Provider Triage Evaluation Note ? ?Deborah Weeks , a 76 y.o. female  was evaluated in triage.  Pt complains of abdominal pain.  Reports that this morning she had her yogurt and coffee and shortly after she started to feel epigastric pain radiating to both sides.  Denies any associated chest pain or shortness of breath.  Says she has never had abdominal surgeries.  History of diabetes and heart failure. ? ?Review of Systems  ?Positive: Abdominal pain, nausea and vomiting ?Negative: Diarrhea or constipation ? ?Physical Exam  ?BP (!) 151/71   Pulse 64   Temp 98.4 ?F (36.9 ?C) (Oral)   Resp 16   LMP  (LMP Unknown)   SpO2 96%  ?Gen:   Awake, no distress   ?Resp:  Normal effort  ?MSK:   Moves extremities without difficulty  ?Other:  Tenderness to the epigastrium.  Negative McBurney's and Murphy's.  RRR, no obvious bruising or injury ? ?Medical Decision Making  ?Medically screening exam initiated at 8:31 PM.  Appropriate orders placed.  Deborah Weeks was informed that the remainder of the evaluation will be completed by another provider, this initial triage assessment does not replace that evaluation, and the importance of remaining in the ED until their evaluation is complete. ? ?Abdominal pain work-up + EKG and troponin ?  ?Rhae Hammock, PA-C ?01/27/22 2032 ? ?

## 2022-01-28 ENCOUNTER — Emergency Department (HOSPITAL_COMMUNITY): Payer: Medicare Other

## 2022-01-28 DIAGNOSIS — R109 Unspecified abdominal pain: Secondary | ICD-10-CM | POA: Diagnosis not present

## 2022-01-28 DIAGNOSIS — I700 Atherosclerosis of aorta: Secondary | ICD-10-CM | POA: Diagnosis not present

## 2022-01-28 DIAGNOSIS — R1013 Epigastric pain: Secondary | ICD-10-CM | POA: Diagnosis not present

## 2022-01-28 MED ORDER — ONDANSETRON HCL 4 MG/2ML IJ SOLN
4.0000 mg | Freq: Once | INTRAMUSCULAR | Status: AC
  Administered 2022-01-28: 4 mg via INTRAVENOUS
  Filled 2022-01-28: qty 2

## 2022-01-28 MED ORDER — SUCRALFATE 1 G PO TABS
1.0000 g | ORAL_TABLET | Freq: Four times a day (QID) | ORAL | 0 refills | Status: DC | PRN
Start: 2022-01-28 — End: 2022-03-02

## 2022-01-28 MED ORDER — SODIUM CHLORIDE 0.9 % IV BOLUS
1000.0000 mL | Freq: Once | INTRAVENOUS | Status: AC
Start: 2022-01-28 — End: 2022-01-28
  Administered 2022-01-28: 1000 mL via INTRAVENOUS

## 2022-01-28 MED ORDER — ONDANSETRON 4 MG PO TBDP: 4.0000 mg | ORAL_TABLET | Freq: Three times a day (TID) | ORAL | 0 refills | Status: AC | PRN

## 2022-01-28 MED ORDER — FENTANYL CITRATE PF 50 MCG/ML IJ SOSY
25.0000 ug | PREFILLED_SYRINGE | Freq: Once | INTRAMUSCULAR | Status: AC
  Administered 2022-01-28: 25 ug via INTRAVENOUS
  Filled 2022-01-28: qty 1

## 2022-01-28 MED ORDER — FAMOTIDINE IN NACL 20-0.9 MG/50ML-% IV SOLN
20.0000 mg | Freq: Once | INTRAVENOUS | Status: AC
  Administered 2022-01-28: 20 mg via INTRAVENOUS
  Filled 2022-01-28: qty 50

## 2022-01-28 NOTE — Discharge Instructions (Addendum)
You were evaluated in the Emergency Department and after careful evaluation, we did not find any emergent condition requiring admission or further testing in the hospital. ? ?Your exam/testing today is overall reassuring.  Symptoms likely due to a stomach bug.  Can use the Zofran at home for nausea.  Can use the Carafate at home for abdominal pain as needed.  Recommend follow-up with your primary care doctor. ? ?Please return to the Emergency Department if you experience any worsening of your condition.   Thank you for allowing [place] to be a part of your care. ?

## 2022-01-28 NOTE — ED Provider Notes (Signed)
?[place] DEPT ?[facility] Emergency Department ?Provider Note ?MRN:  [id number]  ?Arrival date & time: 01/28/22    ? ?Chief Complaint   ?Abdominal Pain ?  ?History of Present Illness   ?Deborah Weeks is a 76 y.o. year-old female with a history of hypertension, diabetes, lupus presenting to the ED with chief complaint of abdominal pain. ? ?Epigastric abdominal pain all day today.  Associated with nausea, 3 episodes of nonbloody nonbilious emesis.  Denies fever, no chest pain or shortness of breath, no lower abdominal pain.  No dysuria. ? ?Review of Systems  ?A thorough review of systems was obtained and all systems are negative except as noted in the HPI and PMH.  ? ?Patient's Health History   ? ?Past Medical History:  ?Diagnosis Date  ? Arthritis   ? Diabetes mellitus without complication ([place])   ? High cholesterol   ? Hypertension   ? Lupus ([place])   ? Neuropathy   ?  ?Past Surgical History:  ?Procedure Laterality Date  ? BACK SURGERY    ? LAPAROSCOPIC TUBAL LIGATION    ?  ?Family History  ?Problem Relation Age of Onset  ? Diabetes Mother   ? Hypertension Mother   ? Diabetes Father   ? Hypertension Father   ? Breast cancer Cousin   ? Alpha-1 antitrypsin deficiency Neg Hx   ? Lupus Neg Hx   ? Neurofibromatosis Neg Hx   ? Coronary artery disease Neg Hx   ?  ?Social History  ? ?Socioeconomic History  ? Marital status: Single  ?  Spouse name: Not on file  ? Number of children: Not on file  ? Years of education: Not on file  ? Highest education level: Not on file  ?Occupational History  ? Not on file  ?Tobacco Use  ? Smoking status: Former  ?  Packs/day: 1.00  ?  Years: 40.00  ?  Pack years: 40.00  ?  Types: Cigarettes  ?  Quit date: 10/11/2016  ?  Years since quitting: 5.3  ? Smokeless tobacco: Never  ?Vaping Use  ? Vaping Use: Never used  ?Substance and Sexual Activity  ? Alcohol use: Never  ?  Alcohol/week: 0.0 standard drinks  ? Drug use: Never  ? Sexual activity: Not on file  ?Other Topics Concern   ? Not on file  ?Social History Narrative  ? Not on file  ? ?Social Determinants of Health  ? ?Financial Resource Strain: Not on file  ?Food Insecurity: Not on file  ?Transportation Needs: Not on file  ?Physical Activity: Not on file  ?Stress: Not on file  ?Social Connections: Not on file  ?Intimate Partner Violence: Not on file  ?  ? ?Physical Exam  ? ?Vitals:  ? 01/27/22 2026 01/28/22 0004  ?BP: (!) 151/71 119/70  ?Pulse: 64 62  ?Resp: 16 19  ?Temp: 98.4 ?F (36.9 ?C)   ?SpO2: 96% 96%  ?  ?CONSTITUTIONAL: Well-appearing, NAD ?NEURO/PSYCH:  Alert and oriented x 3, no focal deficits ?EYES:  eyes equal and reactive ?ENT/NECK:  no LAD, no JVD ?CARDIO: Regular rate, well-perfused, normal S1 and S2 ?PULM:  CTAB no wheezing or rhonchi ?GI/GU:  non-distended, non-tender ?MSK/SPINE:  No gross deformities, no edema ?SKIN:  no rash, atraumatic ? ? ?*Additional and/or pertinent findings included in MDM below ? ?Diagnostic and Interventional Summary  ? ? EKG Interpretation ? ?Date/Time:  Wednesday January 27 2022 20:31:10 EDT ?Ventricular Rate:  70 ?PR Interval:    ?QRS Duration: 84 ?QT  Interval:  428 ?QTC Calculation: 462 ?R Axis:   48 ?Text Interpretation: Sinus rhythm with frequent PACs Abnormal ECG When compared with ECG of 22-Sep-2019 12:29, PREVIOUS ECG IS PRESENT Confirmed by Gerlene Fee [id number]) on 01/28/2022 1:06:38 AM ?  ? ?  ? ?Labs Reviewed  ?COMPREHENSIVE METABOLIC PANEL - Abnormal; Notable for the following components:  ?    Result Value  ? Potassium 3.4 (*)   ? Glucose, Bld 205 (*)   ? Creatinine, Ser 1.87 (*)   ? Alkaline Phosphatase 30 (*)   ? GFR, Estimated 28 (*)   ? All other components within normal limits  ?CBC - Abnormal; Notable for the following components:  ? MCH 25.1 (*)   ? All other components within normal limits  ?URINALYSIS, ROUTINE W REFLEX MICROSCOPIC - Abnormal; Notable for the following components:  ? APPearance HAZY (*)   ? Leukocytes,Ua LARGE (*)   ? WBC, UA >50 (*)   ? Bacteria, UA RARE (*)    ? Non Squamous Epithelial 0-5 (*)   ? All other components within normal limits  ?CBG MONITORING, ED - Abnormal; Notable for the following components:  ? Glucose-Capillary 221 (*)   ? All other components within normal limits  ?LIPASE, BLOOD  ?TROPONIN I (HIGH SENSITIVITY)  ?TROPONIN I (HIGH SENSITIVITY)  ?  ?CT ABDOMEN PELVIS WO CONTRAST  ?Final Result  ?  ?  ?Medications  ?sodium chloride 0.9 % bolus 1,000 mL (1,000 mLs Intravenous New Bag/Given 01/28/22 0220)  ?ondansetron [employer]) injection 4 mg (4 mg Intravenous Given 01/28/22 0220)  ?fentaNYL (SUBLIMAZE) injection 25 mcg (25 mcg Intravenous Given 01/28/22 0220)  ?famotidine (PEPCID) IVPB 20 mg premix (20 mg Intravenous New Bag/Given 01/28/22 0222)  ?  ? ?Procedures  /  Critical Care ?Procedures ? ?ED Course and Medical Decision Making  ?Initial Impression and Ddx ?Epigastric pain with nausea vomiting, differential diagnosis includes pancreatitis, cholecystitis, biliary colic, gastritis, malignancy is considered given advanced age.  Awaiting labs, CT. ? ?Past medical/surgical history that increases complexity of ED encounter: Hypertension, diabetes ? ?Interpretation of Diagnostics ?I personally reviewed the EKG and my interpretation is as follows: Sinus rhythm with frequent PACs ?   ?Laboratory assessment reveals no significant blood count disturbance, there is mild acute kidney injury given the increase in creatinine.  Troponin and lipase negative. ? ?Patient Reassessment and Ultimate Disposition/Management ?Patient is feeling better after medications listed above.  CT imaging is without acute process.  With further history taking, sister at home has been having similar GI disturbance and so viral gastritis is favored.  Appropriate for discharge. ? ?Patient management required discussion with the following services or consulting groups:  None ? ?Complexity of Problems Addressed ?Acute illness or injury that poses threat of life of bodily function ? ?Additional  Data Reviewed and Analyzed ?Further history obtained from: ?Further history from spouse/family member ? ?Additional Factors Impacting ED Encounter Risk ?Use of parenteral controlled substances ? ?Barth Kirks. Sedonia Small, MD ?[facility] Emergency Medicine ?[email or web address] ? ?Final Clinical Impressions(s) / ED Diagnoses  ? ?  ICD-10-CM   ?1. Epigastric pain  R10.13   ?  ?2. Nausea and vomiting, unspecified vomiting type  R11.2   ?  ?  ?ED Discharge Orders   ? ?      Ordered  ?  ondansetron (ZOFRAN-ODT) 4 MG disintegrating tablet  Every 8 hours PRN       ? 01/28/22 0256  ?  sucralfate (CARAFATE) 1 g tablet  4 times daily PRN       ? 01/28/22 0256  ? ?  ?  ? ?  ?  ? ?Discharge Instructions Discussed with and Provided to Patient:  ? ? ? ?Discharge Instructions   ? ?  ?You were evaluated in the Emergency Department and after careful evaluation, we did not find any emergent condition requiring admission or further testing in the hospital. ? ?Your exam/testing today is overall reassuring.  Symptoms likely due to a stomach bug.  Can use the Zofran at home for nausea.  Can use the Carafate at home for abdominal pain as needed.  Recommend follow-up with your primary care doctor. ? ?Please return to the Emergency Department if you experience any worsening of your condition.   Thank you for allowing [place] to be a part of your care. ? ? ? ? ?  ?Maudie Flakes, MD ?01/28/22 [phone] ? ?

## 2022-01-29 ENCOUNTER — Ambulatory Visit (INDEPENDENT_AMBULATORY_CARE_PROVIDER_SITE_OTHER): Payer: Medicare Other | Admitting: Emergency Medicine

## 2022-01-29 DIAGNOSIS — R0602 Shortness of breath: Secondary | ICD-10-CM

## 2022-01-29 LAB — PULMONARY FUNCTION TEST
DL/VA % pred: 101 %
DL/VA: 4.08 ml/min/mmHg/L
DLCO cor % pred: 67 %
DLCO cor: 14.22 ml/min/mmHg
DLCO unc % pred: 62 %
DLCO unc: 13.1 ml/min/mmHg
FEF 25-75 Post: 1.14 L/sec
FEF 25-75 Pre: 1.22 L/sec
FEF2575-%Change-Post: -7 %
FEF2575-%Pred-Post: 67 %
FEF2575-%Pred-Pre: 72 %
FEV1-%Change-Post: 0 %
FEV1-%Pred-Post: 66 %
FEV1-%Pred-Pre: 66 %
FEV1-Post: 1.29 L
FEV1-Pre: 1.29 L
FEV1FVC-%Change-Post: -1 %
FEV1FVC-%Pred-Pre: 104 %
FEV6-%Change-Post: 1 %
FEV6-%Pred-Post: 67 %
FEV6-%Pred-Pre: 66 %
FEV6-Post: 1.64 L
FEV6-Pre: 1.62 L
FEV6FVC-%Change-Post: 0 %
FEV6FVC-%Pred-Post: 103 %
FEV6FVC-%Pred-Pre: 103 %
FVC-%Change-Post: 1 %
FVC-%Pred-Post: 65 %
FVC-%Pred-Pre: 64 %
FVC-Post: 1.64 L
FVC-Pre: 1.62 L
Post FEV1/FVC ratio: 79 %
Post FEV6/FVC ratio: 100 %
Pre FEV1/FVC ratio: 80 %
Pre FEV6/FVC Ratio: 100 %

## 2022-01-29 NOTE — Progress Notes (Signed)
Full PFT performed today. °

## 2022-01-29 NOTE — Patient Instructions (Signed)
Full PFT performed today. °

## 2022-02-04 NOTE — Progress Notes (Signed)
?Cardiology Office Note:   ? ?Date:  02/05/2022  ? ?ID:  Deborah Weeks, DOB 12-09-1945, MRN [id number] ? ?PCP:  Caren Macadam, MD ?  ?[facility] ?Cardiologist:  Candee Furbish, MD    ? ?Referring MD: Caren Macadam, MD  ? ?CC: follow up  ? ?History of Present Illness:   ? ?Deborah Weeks is a 76 y.o. female with a hx of HTN, HLD, DMT2 with neuropathy, lupus, chronic diastolic CHF, previous tobacco abuse, Covid PNA, PAF and severe pulmonary hypertension who presents for follow up. ? ?Deborah Weeks is followed by Dr. Marlou Porch and was last seen in 10/2019. ? ?The patient had a nuclear stress test in 07/2018.  EF 71% with no wall motion abnormalities.  The study was felt to be normal with no significant perfusion defects or ischemia.   ? ?She was admitted to the hospital in 10/2018 with sepsis/acute respiratory failure with hypoxia secondary to lobar pneumonia.  She also had acute on chronic diastolic heart failure and was diuresed with IV Lasix as well as paroxysmal atrial fibrillation.  She was not started on anticoagulation due to an episode of hemoptysis during this admission.  Echocardiogram showed EF 60 to 65%, indeterminate diastolic function and trivial tricuspid regurgitation.  ? ?She wore a heart monitor after this admission which showed no atrial fibrillation.  Since she had an isolated episode of A-fib in the setting of acute illness with no evidence for recurrence, it was decided to defer long-term oral anticoagulation. ? ?She was admitted in 10/2019 with COVID and treated with remdesivir. Since then she has been on PRN 02.  ? ?I saw her in clinic for evaluation on 12/24/21 for evaluation of NYHA class III symptoms of dyspnea as well as orthopnea.  She also had hypoxia with ambulation. Follow up CXR showed pulmonary infiltrates with effusions and labs with creat 1.58 (likely baseline- first lab since 09/2019), Hg 11.1, nt pro bnp 882. ? ?I restarted Lasix 20 mg daily ( she had been on this  before but was discontinued for unknown reasons) and ordered a nuclear stress test and repeat echo. I also sent her to pulmonary given ambulatory hypoxemia. ? ?She saw Dr. Brock Ra on 12/29/21 who asked her to continue use 2 L/min until we have her volume optimized. He wanted to wait to see how she responded clinically to reinitiation of diuretics and then determine when we follow-up whether to pursue PFT, CT chest. He felt that she was at risk for interstitial lung disease given her history of SLE and the COVID-19 in 2021.  ? ?Nuclear stress test 01/12/22 was low risk with an EF of 73% ? ?2D echo 01/12/22 showed EF 65-70%, mildly enlarged RV, severe pulm HTN 74.6 mm hg and mild to mod MR.  ? ?She was seen back by Dr. Lamonte Sakai on 01/15/2022 who felt her pulmonary hypertension was multifactorial.  He ordered a nocturnal oximetry, PFTs and a VQ scan in preparation for a visit with Dr. Haroldine Laws in May.   ? ?She underwent VQ scan on 01/28/2022 which was negative for chronic PE.  Later that night she presented to the ER for evaluation of emesis and diagnosed with gastritis. Labs showed AKI with creat up to 1.87. K 3.4. ? ?PFT were completed 01/29/22. ? ?Today she presents to clinic for follow-up.  She was initially doing better but over the past couple days has had worsening of her dyspnea.  She feels like she is smothering and has chest pressure when  she lays flat.  She has mild lower extremity edema.  She denies palpitations.  She was visibly very short of breath when walking into the clinic today.  We checked her oxygen level and it dropped to 78%.  This improved to 95 when placed on 2 L of oxygen.  She is desperate for help and wants to get back to her normal life. ? ? ?Past Medical History:  ?Diagnosis Date  ? Arthritis   ? Diabetes mellitus without complication ([place])   ? High cholesterol   ? Hypertension   ? Lupus ([place])   ? Neuropathy   ? ? ?Past Surgical History:  ?Procedure Laterality Date  ? BACK SURGERY    ? LAPAROSCOPIC  TUBAL LIGATION    ? ? ?Current Medications: ?Current Meds  ?Medication Sig  ? acetaminophen (TYLENOL) 325 MG tablet Take 2 tablets (650 mg total) by mouth every 6 (six) hours as needed for mild pain or headache (fever >/= 101).  ? amLODipine (NORVASC) 10 MG tablet Take 1 tablet (10 mg total) by mouth daily. (Patient taking differently: Take 5 mg by mouth daily.)  ? aspirin 81 MG EC tablet 1 tablet  ? carvedilol (COREG) 25 MG tablet Take 25 mg by mouth 2 (two) times daily.   ? Cyanocobalamin (VITAMIN B12) 1000 MCG TBCR 1 tablet  ? fenofibrate (TRICOR) 145 MG tablet Take 145 mg by mouth daily.  ? furosemide (LASIX) 20 MG tablet Take 1 tablet (20 mg total) by mouth daily.  ? hydrALAZINE (APRESOLINE) 25 MG tablet Take 25 mg by mouth 2 (two) times daily.  ? Lancets (ONETOUCH DELICA PLUS [id number]) MISC 3 (three) times daily.   ? linagliptin (TRADJENTA) 5 MG TABS tablet Take 1 tablet (5 mg total) by mouth daily.  ? LYRICA 75 MG capsule Take 75 mg by mouth 3 (three) times daily.  ? ONETOUCH VERIO test strip 3 (three) times daily.   ? pravastatin (PRAVACHOL) 20 MG tablet Take 20 mg by mouth daily.  ? pregabalin (LYRICA) 25 MG capsule SMARTSIG:1 Capsule(s) By Mouth PRN  ? pregabalin (LYRICA) 75 MG capsule 1 capsule  ? ?Current Facility-Administered Medications for the 02/05/22 encounter (Office Visit) with Eileen Stanford, PA-C  ?Medication  ? triamcinolone acetonide (KENALOG) 10 MG/ML injection 10 mg  ?  ? ?Allergies:   Atorvastatin, Escitalopram oxalate, Xanax [alprazolam], Codeine, Gabapentin, Niacin and related, and Spironolactone  ? ?Social History  ? ?Socioeconomic History  ? Marital status: Single  ?  Spouse name: Not on file  ? Number of children: Not on file  ? Years of education: Not on file  ? Highest education level: Not on file  ?Occupational History  ? Not on file  ?Tobacco Use  ? Smoking status: Former  ?  Packs/day: 1.00  ?  Years: 40.00  ?  Pack years: 40.00  ?  Types: Cigarettes  ?  Quit date: 10/11/2016   ?  Years since quitting: 5.3  ? Smokeless tobacco: Never  ?Vaping Use  ? Vaping Use: Never used  ?Substance and Sexual Activity  ? Alcohol use: Never  ?  Alcohol/week: 0.0 standard drinks  ? Drug use: Never  ? Sexual activity: Not on file  ?Other Topics Concern  ? Not on file  ?Social History Narrative  ? Not on file  ? ?Social Determinants of Health  ? ?Financial Resource Strain: Not on file  ?Food Insecurity: Not on file  ?Transportation Needs: Not on file  ?Physical Activity: Not on file  ?  Stress: Not on file  ?Social Connections: Not on file  ?  ? ?Family History: ?The patient's family history includes Breast cancer in her cousin; Diabetes in her father and mother; Hypertension in her father and mother. There is no history of Alpha-1 antitrypsin deficiency, Lupus, Neurofibromatosis, or Coronary artery disease. ? ?ROS:   ?Please see the history of present illness.    All other systems reviewed and are negative. ? ?EKGs/Labs/Other Studies Reviewed:   ? ?The following studies were reviewed today: ? ? ?Echocardiogram 10/17/2018: ?- Left ventricle: The cavity size was normal. Systolic function was ?  normal. The estimated ejection fraction was in the range of 60% ?  to 65%. Wall motion was normal; there were no regional wall ?  motion abnormalities. The study is not technically sufficient to ?  allow evaluation of LV diastolic function. ?- Aortic valve: Trileaflet; normal thickness, mildly calcified ?  leaflets. ?- Tricuspid valve: There was trivial regurgitation. ?- Pulmonary arteries: Systolic pressure could not be accurately ?  estimated. ?  ?Nuclear stress test 08/08/2018: ?Nuclear stress EF: 71%. No wall motion abnormalities ?There was no ST segment deviation noted during stress. ?The study is normal. There are no significant perfusion defects or ischemia identified. ?This is a low risk study. ? ?Event monitor: 2/11-3/16/20 ?Study Highlights ? ?  ?Normal sinus rhythm ?Rare premature atrial contractions.  (PAC's) ?Short episode of ventricular bigminy (PVC"s) ?No pauses ?No atrial fibrillation ?  ?Overall reassuring. ? ?_____________________ ? ?[place] 01/12/22 ?Study Highlights ? ?  ?  The study is normal. The study i

## 2022-02-05 ENCOUNTER — Encounter: Payer: Self-pay | Admitting: Physician Assistant

## 2022-02-05 ENCOUNTER — Ambulatory Visit (INDEPENDENT_AMBULATORY_CARE_PROVIDER_SITE_OTHER): Payer: Medicare Other | Admitting: Physician Assistant

## 2022-02-05 VITALS — BP 130/90 | HR 75 | Ht 69.0 in | Wt 208.0 lb

## 2022-02-05 DIAGNOSIS — I5032 Chronic diastolic (congestive) heart failure: Secondary | ICD-10-CM | POA: Diagnosis not present

## 2022-02-05 DIAGNOSIS — G629 Polyneuropathy, unspecified: Secondary | ICD-10-CM

## 2022-02-05 DIAGNOSIS — I10 Essential (primary) hypertension: Secondary | ICD-10-CM | POA: Diagnosis not present

## 2022-02-05 DIAGNOSIS — I480 Paroxysmal atrial fibrillation: Secondary | ICD-10-CM | POA: Diagnosis not present

## 2022-02-05 DIAGNOSIS — N179 Acute kidney failure, unspecified: Secondary | ICD-10-CM | POA: Diagnosis not present

## 2022-02-05 DIAGNOSIS — J9611 Chronic respiratory failure with hypoxia: Secondary | ICD-10-CM | POA: Diagnosis not present

## 2022-02-05 DIAGNOSIS — I272 Pulmonary hypertension, unspecified: Secondary | ICD-10-CM

## 2022-02-05 NOTE — Patient Instructions (Addendum)
Medication Instructions:  ?Your physician recommends that you continue on your current medications as directed. Please refer to the Current Medication list given to you today. ? ?*If you need a refill on your cardiac medications before your next appointment, please call your pharmacy* ? ? ?Lab Work: ?Bmp - today  ? ?If you have labs (blood work) drawn today and your tests are completely normal, you will receive your results only by: ?MyChart Message (if you have MyChart) OR ?A paper copy in the mail ?If you have any lab test that is abnormal or we need to change your treatment, we will call you to review the results. ? ? ?Testing/Procedures: ?None ordered  ? ? ?Follow-Up: ?Follow up as scheduled 1}  ? ? ?Other Instructions ? ?Important Information About Sugar ? ? ?Please enter at the [facility] and Children[facility] Entrance (Entrance [address] off [address]). You may use the FREE valet parking offered at entrance C (encouraged to control the heart rate for the test). Then proceed to the [facility]. ? ? ? ? ?

## 2022-02-06 LAB — BASIC METABOLIC PANEL
BUN/Creatinine Ratio: 13 (ref 12–28)
BUN: 18 mg/dL (ref 8–27)
CO2: 25 mmol/L (ref 20–29)
Calcium: 10.2 mg/dL (ref 8.7–10.3)
Chloride: 98 mmol/L (ref 96–106)
Creatinine, Ser: 1.36 mg/dL — ABNORMAL HIGH (ref 0.57–1.00)
Glucose: 109 mg/dL — ABNORMAL HIGH (ref 70–99)
Potassium: 4.2 mmol/L (ref 3.5–5.2)
Sodium: 142 mmol/L (ref 134–144)
eGFR: 40 mL/min/{1.73_m2} — ABNORMAL LOW (ref >59)

## 2022-02-09 ENCOUNTER — Ambulatory Visit (HOSPITAL_COMMUNITY)
Admission: RE | Admit: 2022-02-09 | Discharge: 2022-02-09 | Disposition: A | Discharge status: Home / Self Care | Payer: Medicare Other | Source: Ambulatory Visit | Attending: Internal Medicine | Admitting: Internal Medicine

## 2022-02-09 ENCOUNTER — Other Ambulatory Visit (HOSPITAL_COMMUNITY): Payer: Self-pay

## 2022-02-09 ENCOUNTER — Encounter (HOSPITAL_COMMUNITY): Payer: Self-pay | Admitting: Internal Medicine

## 2022-02-09 VITALS — BP 140/90 | HR 79 | Wt 208.8 lb

## 2022-02-09 DIAGNOSIS — I272 Pulmonary hypertension, unspecified: Secondary | ICD-10-CM | POA: Diagnosis not present

## 2022-02-09 DIAGNOSIS — I480 Paroxysmal atrial fibrillation: Secondary | ICD-10-CM | POA: Diagnosis not present

## 2022-02-09 DIAGNOSIS — I5032 Chronic diastolic (congestive) heart failure: Secondary | ICD-10-CM | POA: Diagnosis not present

## 2022-02-09 MED ORDER — EMPAGLIFLOZIN 10 MG PO TABS: 10.0000 mg | ORAL_TABLET | Freq: Every day | ORAL | 6 refills | Status: DC

## 2022-02-09 MED ORDER — SPIRONOLACTONE 25 MG PO TABS: 12.5000 mg | ORAL_TABLET | Freq: Every day | ORAL | 3 refills | Status: AC

## 2022-02-09 NOTE — H&P (View-Only) (Signed)
? ?ADVANCED HF CLINIC CONSULT NOTE ? ?Referring Physician:Kathryn Grandville Silos, PA-C ?Primary Care:  Caren Macadam, MD ?Primary Cardiologist: Candee Furbish ? ?HPI: ? ?Deborah Weeks is a 76 y.o. female with a hx of morbid obesity, HTN, DM2 with neuropathy, lupus, chronic diastolic CHF, previous tobacco abuse, PAF and severe pulmonary hypertension by echo 3/23 who is referred by Nell Range, PA-C for further evaluation of dyspnea and PH.  ? ?The patient had a nuclear stress test in 07/2018.  EF 71% with no wall motion abnormalities.  The study was felt to be normal with no significant perfusion defects or ischemia.   ?  ?She was admitted to the hospital in 10/2018 with sepsis/acute respiratory failure with hypoxia secondary to PNA and a/c diastolic HF with PAF. Echo EF 60-65% intdeterminate DD.  She was not started on anticoagulation due to an episode of hemoptysis during this admission.   ?  ?She wore a heart monitor after this admission which showed no atrial fibrillation.  Since she had an isolated episode of A-fib in the setting of acute illness with no evidence for recurrence, it was decided to defer long-term oral anticoagulation. ?  ?She was admitted in 10/2019 with COVID and treated with remdesivir. Since then she has been on PRN 02.  ?  ?Seen 3/23 by Nell Range, PA-C. For worsening dyspnea. Follow up CXR showed pulmonary infiltrates with effusions and labs with creat 1.58 (likely baseline- first lab since 09/2019), nt pro bnp 882.Lasix restarted. Echo ordered referred to Dr. Lamonte Sakai and [place].  ?  ?She saw Dr. Brock Ra on 12/29/21 who asked her to continue use 2 L/min until we have her volume optimized. He felt that she was at risk for interstitial lung disease given her history of SLE and the COVID-19 in 2021.  ?  ?Nuclear stress test 01/12/22 was low risk with an EF of 73% ?  ?2D echo 01/12/22 showed EF 65-70%, severe LAE, with restrictive filling pattern E/E" 29.2 mildly enlarged RV, severe pulm HTN 74.6 mm hg  and mild to mod MR.  ?  ?She was seen back by Dr. Lamonte Sakai on 01/15/2022 who felt her pulmonary hypertension was multifactorial.  He ordered a nocturnal oximetry, PFTs and a VQ scan ?  ?VQ scan 01/28/2022: normal ?PFT were completed 01/29/22. ?FEV1 1/29 (66%) ?FVC 1.62 (64% ?DLCO 62% ? ?Here with her daughter. Says breathing started getting bad about 3 months ago. Smoked for 40 years. 1ppd. Quit 5 years ago. Has good days and bad days. Now on lasix 20 mg daily and symptoms improved. Off oxygen for the most part. But still has to use it at night at times. Daughter says she no longer snores after she stopped smoking. No apnea. Likes to walk but often has to stop due to SOB frequently. On good days can walk 7-8 blocks on bad days just one block. Doesn't weigh daily. Occasional chest pressure particularly when she is SOB.Denies DVT or PE ? ? ?Review of Systems: [y] = yes, _0  = no  ? ?General: Weight gain _1 ; Weight loss _2 ; Anorexia _3 ; Fatigue _4 ; Fever _5 ; Chills _6 ; Weakness _7   ?Cardiac: Chest pain/pressure _8 ; Resting SOB Blue.Reese ]; Exertional SOB [ y]; Orthopnea [ y]; Pedal Edema Blue.Reese ]; Palpitations _9 ; Syncope _10 ; Presyncope _11 ; Paroxysmal nocturnal dyspnea_12   ?Pulmonary: Cough _13 ; Wheezing_14 ; Hemoptysis_15 ; Sputum _16 ; Snoring _17   ?GI: Vomiting_18 ; Dysphagia_19 ; Melena_20 ;  Hematochezia _0 ; Heartburn_1 ; Abdominal pain _2 ; Constipation _3 ; Diarrhea _4 ; BRBPR _5   ?GU: Hematuria_6 ; Dysuria _7 ; Nocturia_8   ?Vascular: Pain in legs with walking _9 ; Pain in feet with lying flat _10 ; Non-healing sores _11 ; Stroke _12 ; TIA _13 ; Slurred speech _14 ;  ?Neuro: Headaches_15 ; Vertigo_16 ; Seizures_17 ; Paresthesias_18 ;Blurred vision _19 ; Diplopia _20 ; Vision changes _21   ?Ortho/Skin: Arthritis [ y]; Joint pain [ y]; Muscle pain _22 ; Joint swelling _23 ; Back Pain _24 ; Rash _25   ?Psych: Depression_26 ; Anxiety_27   ?Heme: Bleeding problems _28 ; Clotting disorders _29 ; Anemia _30   ?Endocrine: Diabetes [ y]; Thyroid  dysfunction_31  ? ? ?Past Medical History:  ?Diagnosis Date  ? Arthritis   ? Diabetes mellitus without complication ([place])   ? High cholesterol   ? Hypertension   ? Lupus ([place])   ? Neuropathy   ? ? ?Current Outpatient Medications  ?Medication Sig Dispense Refill  ? acetaminophen (TYLENOL) 325 MG tablet Take 2 tablets (650 mg total) by mouth every 6 (six) hours as needed for mild pain or headache (fever >/= 101).    ? amLODipine (NORVASC) 5 MG tablet Take 5 mg by mouth daily.    ? aspirin 81 MG EC tablet 1 tablet    ? carvedilol (COREG) 25 MG tablet Take 25 mg by mouth 2 (two) times daily.     ? Cyanocobalamin (VITAMIN B12) 1000 MCG TBCR daily.    ? empagliflozin (JARDIANCE) 10 MG TABS tablet Take 1 tablet (10 mg total) by mouth daily before breakfast. 30 tablet 6  ? fenofibrate (TRICOR) 145 MG tablet Take 145 mg by mouth daily.    ? furosemide (LASIX) 20 MG tablet Take 1 tablet (20 mg total) by mouth daily. 30 tablet 11  ? hydrALAZINE (APRESOLINE) 25 MG tablet Take 25 mg by mouth 2 (two) times daily.    ? linagliptin (TRADJENTA) 5 MG TABS tablet Take 1 tablet (5 mg total) by mouth daily. 30 tablet 1  ? LYRICA 75 MG capsule Take 75 mg by mouth 3 (three) times daily.  1  ? ondansetron (ZOFRAN-ODT) 4 MG disintegrating tablet Take 1 tablet (4 mg total) by mouth every 8 (eight) hours as needed for nausea or vomiting. 20 tablet 0  ? pregabalin (LYRICA) 25 MG capsule Take 25 mg by mouth at bedtime.    ? pregabalin (LYRICA) 75 MG capsule Take 75 mg by mouth at bedtime.    ? sucralfate (CARAFATE) 1 g tablet Take 1 tablet (1 g total) by mouth 4 (four) times daily as needed. 30 tablet 0  ? Blood Glucose Monitoring Suppl (ONETOUCH VERIO FLEX SYSTEM) w/Device KIT 2 (two) times daily.  (Patient not taking: Reported on 02/09/2022)    ? Lancets (ONETOUCH DELICA PLUS EGBTDV76H) MISC 3 (three) times daily.  (Patient not taking: Reported on 02/09/2022)    ? ONETOUCH VERIO test strip 3 (three) times daily.  (Patient not taking: Reported on  02/09/2022)    ? ?Current Facility-Administered Medications  ?Medication Dose Route Frequency Provider Last Rate Last Admin  ? triamcinolone acetonide (KENALOG) 10 MG/ML injection 10 mg  10 mg Other Once Landis Martins, DPM      ? ? ?Allergies  ?Allergen Reactions  ? Atorvastatin Other (See Comments)  ?  Cramps ?  ? Escitalopram Oxalate Other (See Comments)  ?  Insomnia, but patient currently takes this  ?  Xanax [Alprazolam] Other (See Comments)  ?  "Felt like I had a hangover", HA  ? Codeine Nausea And Vomiting  ? Gabapentin Other (See Comments)  ? Niacin And Related Itching and Rash  ? Spironolactone Other (See Comments)  ?  hyperkalemia  ? ? ?  ?Social History  ? ?Socioeconomic History  ? Marital status: Single  ?  Spouse name: Not on file  ? Number of children: Not on file  ? Years of education: Not on file  ? Highest education level: Not on file  ?Occupational History  ? Not on file  ?Tobacco Use  ? Smoking status: Former  ?  Packs/day: 1.00  ?  Years: 40.00  ?  Pack years: 40.00  ?  Types: Cigarettes  ?  Quit date: 10/11/2016  ?  Years since quitting: 5.3  ? Smokeless tobacco: Never  ?Vaping Use  ? Vaping Use: Never used  ?Substance and Sexual Activity  ? Alcohol use: Never  ?  Alcohol/week: 0.0 standard drinks  ? Drug use: Never  ? Sexual activity: Not on file  ?Other Topics Concern  ? Not on file  ?Social History Narrative  ? Not on file  ? ?Social Determinants of Health  ? ?Financial Resource Strain: Not on file  ?Food Insecurity: Not on file  ?Transportation Needs: Not on file  ?Physical Activity: Not on file  ?Stress: Not on file  ?Social Connections: Not on file  ?Intimate Partner Violence: Not on file  ? ? ?  ?Family History  ?Problem Relation Age of Onset  ? Diabetes Mother   ? Hypertension Mother   ? Diabetes Father   ? Hypertension Father   ? Breast cancer Cousin   ? Alpha-1 antitrypsin deficiency Neg Hx   ? Lupus Neg Hx   ? Neurofibromatosis Neg Hx   ? Coronary artery disease Neg Hx   ? ? ?Vitals:  ?  02/09/22 1206  ?BP: 140/90  ?Pulse: 79  ?SpO2: 93%  ?Weight: 94.7 kg (208 lb 12.8 oz)  ? ? ?PHYSICAL EXAM: ?General:  Well appearing. No respiratory difficulty ?HEENT: normal ?Neck: supple. JVP elevated. Musician

## 2022-02-09 NOTE — Patient Instructions (Signed)
Medication Changes: ? ?START Jardiance 10mg  oral, daily.  ?START spironolactone 12.5mg  (1/2 tab) oral, daily.  ? ?Lab Work: ? ?In 2 weeks with your procedure.  ? ?Testing/Procedures: ? ?Your physician has requested that you have an echocardiogram. Echocardiography is a painless test that uses sound waves to create images of your heart. It provides your doctor with information about the size and shape of your heart and how well your heart?s chambers and valves are working. This procedure takes approximately one hour. There are no restrictions for this procedure. 2-3 months with your next appointment.  ? ?Your physician has requested that you have a cardiac catheterization. Cardiac catheterization is used to diagnose and/or treat various heart conditions. Doctors may recommend this procedure for a number of different reasons. The most common reason is to evaluate chest pain. Chest pain can be a symptom of coronary artery disease (CAD), and cardiac catheterization can show whether plaque is narrowing or blocking your heart?s arteries. This procedure is also used to evaluate the valves, as well as measure the blood flow and oxygen levels in different parts of your heart. For further information please visit [email or web address]. Please follow instruction sheet, as given. SEE INSTRUCTIONS BELOW.  ? ? ?Referrals: ? ?none ? ?Special Instructions // Education: ? ?Do the following things EVERYDAY: ?Weigh yourself in the morning before breakfast. Write it down and keep it in a log. ?Take your medicines as prescribed ?Eat low salt foods--Limit salt (sodium) to 2000 mg per day.  ?Stay as active as you can everyday ?Limit all fluids for the day to less than 2 liters ? ? ?Follow-Up in: 2-3  months with Echocardiogram ? ?At the [facility], you and your health needs are our priority. We have a designated team specialized in the treatment of Heart Failure. This Care Team includes your primary Heart Failure  Specialized Cardiologist (physician), Advanced Practice Providers (APPs- Physician Assistants and Nurse Practitioners), and Pharmacist who all work together to provide you with the care you need, when you need it.  ? ?You may see any of the following providers on your designated Care Team at your next follow up: ? ?Dr Glori Bickers ?Dr Loralie Champagne ?Darrick Grinder, NP ?Lyda Jester, PA ?Jessica Milford,NP ?Marlyce Huge, PA ?Audry Riles, PharmD ? ? ?Please be sure to bring in all your medications bottles to every appointment.  ? ?Need to Contact us: ? ?If you have any questions or concerns before your next appointment please send [place] a message through [place] or call our office at [phone].   ? ?TO LEAVE A MESSAGE FOR THE NURSE SELECT OPTION 2, PLEASE LEAVE A MESSAGE INCLUDING: ?YOUR NAME ?DATE OF BIRTH ?CALL BACK NUMBER ?REASON FOR CALL**this is important as we prioritize the call backs ? ?YOU WILL RECEIVE A CALL BACK THE SAME DAY AS LONG AS YOU CALL BEFORE 4:00 PM ? ? ? ? ? ? ?You are scheduled for a Cardiac Catheterization on Wednesday, May 17 with Dr. Glori Bickers. ? ?1. Please arrive at the Main Entrance A at [facility]: [place], [place] [address] at 8:00 AM (This time is two hours before your procedure to ensure your preparation). Free valet parking service is available.  ? ?Special note: Every effort is made to have your procedure done on time. Please understand that emergencies sometimes delay scheduled procedures. ? ?2. Diet: Do not eat solid foods after midnight.  You may have clear liquids until 5 AM upon the day of the  procedure. ? ?3. Labs: Will be done when you arrive.  ? ?4. Medication instructions in preparation for your procedure: ? ?  Wednesday 5/17 AM DO NOT TAKE: Jardiance, spironolactone, furosemide, tradjenta.  ? ?On the morning of your procedure, take Aspirin and any morning medicines NOT listed above.  You may use sips of water. ? ?5. Plan to go home the  same day, you will only stay overnight if medically necessary. ?6. You MUST have a responsible adult to drive you home. ?7. An adult MUST be with you the first 24 hours after you arrive home. ?8. Bring a current list of your medications, and the last time and date medication taken. ?9. Bring ID and current insurance cards. ?10.Please wear clothes that are easy to get on and off and wear slip-on shoes. ? ?Thank you for allowing [place] to care for you! ?  -- [facility] ? ? ?

## 2022-02-09 NOTE — Progress Notes (Signed)
ReDS Vest / Clip - 02/09/22 1300   ? ?  ? ReDS Vest / Clip  ? Station Marker C   ? Ruler Value 30   ? ReDS Value Range Moderate volume overload   ? ReDS Actual Value 40   ? ?  ?  ? ?  ? ? ?

## 2022-02-09 NOTE — Progress Notes (Signed)
? ?ADVANCED HF CLINIC CONSULT NOTE ? ?Referring Physician:Kathryn Grandville Silos, PA-C ?Primary Care:  Caren Macadam, MD ?Primary Cardiologist: Candee Furbish ? ?HPI: ? ?Deborah Weeks is a 76 y.o. female with a hx of morbid obesity, HTN, DM2 with neuropathy, lupus, chronic diastolic CHF, previous tobacco abuse, PAF and severe pulmonary hypertension by echo 3/23 who is referred by Nell Range, PA-C for further evaluation of dyspnea and PH.  ? ?The patient had a nuclear stress test in 07/2018.  EF 71% with no wall motion abnormalities.  The study was felt to be normal with no significant perfusion defects or ischemia.   ?  ?She was admitted to the hospital in 10/2018 with sepsis/acute respiratory failure with hypoxia secondary to PNA and a/c diastolic HF with PAF. Echo EF 60-65% intdeterminate DD.  She was not started on anticoagulation due to an episode of hemoptysis during this admission.   ?  ?She wore a heart monitor after this admission which showed no atrial fibrillation.  Since she had an isolated episode of A-fib in the setting of acute illness with no evidence for recurrence, it was decided to defer long-term oral anticoagulation. ?  ?She was admitted in 10/2019 with COVID and treated with remdesivir. Since then she has been on PRN 02.  ?  ?Seen 3/23 by Nell Range, PA-C. For worsening dyspnea. Follow up CXR showed pulmonary infiltrates with effusions and labs with creat 1.58 (likely baseline- first lab since 09/2019), nt pro bnp 882.Lasix restarted. Echo ordered referred to Dr. Lamonte Sakai and [place].  ?  ?She saw Dr. Brock Ra on 12/29/21 who asked her to continue use 2 L/min until we have her volume optimized. He felt that she was at risk for interstitial lung disease given her history of SLE and the COVID-19 in 2021.  ?  ?Nuclear stress test 01/12/22 was low risk with an EF of 73% ?  ?2D echo 01/12/22 showed EF 65-70%, severe LAE, with restrictive filling pattern E/E" 29.2 mildly enlarged RV, severe pulm HTN 74.6 mm hg  and mild to mod MR.  ?  ?She was seen back by Dr. Lamonte Sakai on 01/15/2022 who felt her pulmonary hypertension was multifactorial.  He ordered a nocturnal oximetry, PFTs and a VQ scan ?  ?VQ scan 01/28/2022: normal ?PFT were completed 01/29/22. ?FEV1 1/29 (66%) ?FVC 1.62 (64% ?DLCO 62% ? ?Here with her daughter. Says breathing started getting bad about 3 months ago. Smoked for 40 years. 1ppd. Quit 5 years ago. Has good days and bad days. Now on lasix 20 mg daily and symptoms improved. Off oxygen for the most part. But still has to use it at night at times. Daughter says she no longer snores after she stopped smoking. No apnea. Likes to walk but often has to stop due to SOB frequently. On good days can walk 7-8 blocks on bad days just one block. Doesn't weigh daily. Occasional chest pressure particularly when she is SOB.Denies DVT or PE ? ? ?Review of Systems: [y] = yes, _0  = no  ? ?General: Weight gain _1 ; Weight loss _2 ; Anorexia _3 ; Fatigue _4 ; Fever _5 ; Chills _6 ; Weakness _7   ?Cardiac: Chest pain/pressure _8 ; Resting SOB Blue.Reese ]; Exertional SOB [ y]; Orthopnea [ y]; Pedal Edema Blue.Reese ]; Palpitations _9 ; Syncope _10 ; Presyncope _11 ; Paroxysmal nocturnal dyspnea_12   ?Pulmonary: Cough _13 ; Wheezing_14 ; Hemoptysis_15 ; Sputum _16 ; Snoring _17   ?GI: Vomiting_18 ; Dysphagia_19 ; Melena_20 ;  Hematochezia _0 ; Heartburn_1 ; Abdominal pain _2 ; Constipation _3 ; Diarrhea _4 ; BRBPR _5   ?GU: Hematuria_6 ; Dysuria _7 ; Nocturia_8   ?Vascular: Pain in legs with walking _9 ; Pain in feet with lying flat _10 ; Non-healing sores _11 ; Stroke _12 ; TIA _13 ; Slurred speech _14 ;  ?Neuro: Headaches_15 ; Vertigo_16 ; Seizures_17 ; Paresthesias_18 ;Blurred vision _19 ; Diplopia _20 ; Vision changes _21   ?Ortho/Skin: Arthritis [ y]; Joint pain [ y]; Muscle pain _22 ; Joint swelling _23 ; Back Pain _24 ; Rash _25   ?Psych: Depression_26 ; Anxiety_27   ?Heme: Bleeding problems _28 ; Clotting disorders _29 ; Anemia _30   ?Endocrine: Diabetes [ y]; Thyroid  dysfunction_31  ? ? ?Past Medical History:  ?Diagnosis Date  ? Arthritis   ? Diabetes mellitus without complication ([place])   ? High cholesterol   ? Hypertension   ? Lupus ([place])   ? Neuropathy   ? ? ?Current Outpatient Medications  ?Medication Sig Dispense Refill  ? acetaminophen (TYLENOL) 325 MG tablet Take 2 tablets (650 mg total) by mouth every 6 (six) hours as needed for mild pain or headache (fever >/= 101).    ? amLODipine (NORVASC) 5 MG tablet Take 5 mg by mouth daily.    ? aspirin 81 MG EC tablet 1 tablet    ? carvedilol (COREG) 25 MG tablet Take 25 mg by mouth 2 (two) times daily.     ? Cyanocobalamin (VITAMIN B12) 1000 MCG TBCR daily.    ? empagliflozin (JARDIANCE) 10 MG TABS tablet Take 1 tablet (10 mg total) by mouth daily before breakfast. 30 tablet 6  ? fenofibrate (TRICOR) 145 MG tablet Take 145 mg by mouth daily.    ? furosemide (LASIX) 20 MG tablet Take 1 tablet (20 mg total) by mouth daily. 30 tablet 11  ? hydrALAZINE (APRESOLINE) 25 MG tablet Take 25 mg by mouth 2 (two) times daily.    ? linagliptin (TRADJENTA) 5 MG TABS tablet Take 1 tablet (5 mg total) by mouth daily. 30 tablet 1  ? LYRICA 75 MG capsule Take 75 mg by mouth 3 (three) times daily.  1  ? ondansetron (ZOFRAN-ODT) 4 MG disintegrating tablet Take 1 tablet (4 mg total) by mouth every 8 (eight) hours as needed for nausea or vomiting. 20 tablet 0  ? pregabalin (LYRICA) 25 MG capsule Take 25 mg by mouth at bedtime.    ? pregabalin (LYRICA) 75 MG capsule Take 75 mg by mouth at bedtime.    ? sucralfate (CARAFATE) 1 g tablet Take 1 tablet (1 g total) by mouth 4 (four) times daily as needed. 30 tablet 0  ? Blood Glucose Monitoring Suppl (ONETOUCH VERIO FLEX SYSTEM) w/Device KIT 2 (two) times daily.  (Patient not taking: Reported on 02/09/2022)    ? Lancets (ONETOUCH DELICA PLUS EGBTDV76H) MISC 3 (three) times daily.  (Patient not taking: Reported on 02/09/2022)    ? ONETOUCH VERIO test strip 3 (three) times daily.  (Patient not taking: Reported on  02/09/2022)    ? ?Current Facility-Administered Medications  ?Medication Dose Route Frequency Provider Last Rate Last Admin  ? triamcinolone acetonide (KENALOG) 10 MG/ML injection 10 mg  10 mg Other Once Landis Martins, DPM      ? ? ?Allergies  ?Allergen Reactions  ? Atorvastatin Other (See Comments)  ?  Cramps ?  ? Escitalopram Oxalate Other (See Comments)  ?  Insomnia, but patient currently takes this  ?  Xanax [Alprazolam] Other (See Comments)  ?  "Felt like I had a hangover", HA  ? Codeine Nausea And Vomiting  ? Gabapentin Other (See Comments)  ? Niacin And Related Itching and Rash  ? Spironolactone Other (See Comments)  ?  hyperkalemia  ? ? ?  ?Social History  ? ?Socioeconomic History  ? Marital status: Single  ?  Spouse name: Not on file  ? Number of children: Not on file  ? Years of education: Not on file  ? Highest education level: Not on file  ?Occupational History  ? Not on file  ?Tobacco Use  ? Smoking status: Former  ?  Packs/day: 1.00  ?  Years: 40.00  ?  Pack years: 40.00  ?  Types: Cigarettes  ?  Quit date: 10/11/2016  ?  Years since quitting: 5.3  ? Smokeless tobacco: Never  ?Vaping Use  ? Vaping Use: Never used  ?Substance and Sexual Activity  ? Alcohol use: Never  ?  Alcohol/week: 0.0 standard drinks  ? Drug use: Never  ? Sexual activity: Not on file  ?Other Topics Concern  ? Not on file  ?Social History Narrative  ? Not on file  ? ?Social Determinants of Health  ? ?Financial Resource Strain: Not on file  ?Food Insecurity: Not on file  ?Transportation Needs: Not on file  ?Physical Activity: Not on file  ?Stress: Not on file  ?Social Connections: Not on file  ?Intimate Partner Violence: Not on file  ? ? ?  ?Family History  ?Problem Relation Age of Onset  ? Diabetes Mother   ? Hypertension Mother   ? Diabetes Father   ? Hypertension Father   ? Breast cancer Cousin   ? Alpha-1 antitrypsin deficiency Neg Hx   ? Lupus Neg Hx   ? Neurofibromatosis Neg Hx   ? Coronary artery disease Neg Hx   ? ? ?Vitals:  ?  02/09/22 1206  ?BP: 140/90  ?Pulse: 79  ?SpO2: 93%  ?Weight: 94.7 kg (208 lb 12.8 oz)  ? ? ?PHYSICAL EXAM: ?General:  Well appearing. No respiratory difficulty ?HEENT: normal ?Neck: supple. JVP elevated. Musician

## 2022-02-17 DIAGNOSIS — Z9981 Dependence on supplemental oxygen: Secondary | ICD-10-CM | POA: Diagnosis not present

## 2022-02-17 DIAGNOSIS — I5032 Chronic diastolic (congestive) heart failure: Secondary | ICD-10-CM | POA: Diagnosis not present

## 2022-02-17 DIAGNOSIS — I272 Pulmonary hypertension, unspecified: Secondary | ICD-10-CM | POA: Diagnosis not present

## 2022-02-17 DIAGNOSIS — E118 Type 2 diabetes mellitus with unspecified complications: Secondary | ICD-10-CM | POA: Diagnosis not present

## 2022-02-17 DIAGNOSIS — E7800 Pure hypercholesterolemia, unspecified: Secondary | ICD-10-CM | POA: Diagnosis not present

## 2022-02-18 ENCOUNTER — Ambulatory Visit (INDEPENDENT_AMBULATORY_CARE_PROVIDER_SITE_OTHER): Payer: Medicare Other | Admitting: Sports Medicine

## 2022-02-18 ENCOUNTER — Encounter: Payer: Self-pay | Admitting: Sports Medicine

## 2022-02-18 DIAGNOSIS — M79675 Pain in left toe(s): Secondary | ICD-10-CM | POA: Diagnosis not present

## 2022-02-18 DIAGNOSIS — M79674 Pain in right toe(s): Secondary | ICD-10-CM | POA: Diagnosis not present

## 2022-02-18 DIAGNOSIS — B351 Tinea unguium: Secondary | ICD-10-CM

## 2022-02-18 DIAGNOSIS — E1142 Type 2 diabetes mellitus with diabetic polyneuropathy: Secondary | ICD-10-CM

## 2022-02-18 DIAGNOSIS — M79672 Pain in left foot: Secondary | ICD-10-CM

## 2022-02-18 DIAGNOSIS — M79671 Pain in right foot: Secondary | ICD-10-CM

## 2022-02-18 NOTE — Progress Notes (Signed)
Patient ID: Deborah Weeks, female   DOB: 05-Dec-1945, 76 y.o.   MRN: [id number] ? ?Subjective: ?Deborah Weeks is a 76 y.o. female patient seen today in office for diabetic nail care.  Reports that she is doing good denies any other pedal complaints or concerns at this time except heart issues and will have a procedure on 5/17. ?PCP Dr. Mannie Stabile Dec 2022 ?A1c 6.5 ?FBS unknown ? ? ? ?Patient Active Problem List  ? Diagnosis Date Noted  ? Other secondary pulmonary hypertension ([place]) 01/15/2022  ? COPD (chronic obstructive pulmonary disease) ([place]) 01/15/2022  ? Dyspnea 12/29/2021  ? HLD (hyperlipidemia)   ? Abnormal urinalysis 10/15/2018  ? Hypokalemia 10/15/2018  ? Chest pain 10/15/2018  ? Chronic respiratory failure with hypoxia ([place]) 10/14/2018  ? Recurrent major depressive disorder, in full remission ([place]) 10/28/2014  ? Multinodular goiter 08/30/2012  ? Allergic rhinitis 05/30/2012  ? DM (diabetes mellitus) ([place]) 01/05/2011  ? Controlled type 2 diabetes mellitus without complication ([place]) 78/29/5621  ? Hypertension 01/04/2011  ? Osteopenia 12/07/2010  ? Glaucoma 02/27/2010  ? History of neoplasm of bladder 02/27/2010  ? Systemic lupus erythematosus ([place]) 02/27/2010  ? ? ?Current Outpatient Medications on File Prior to Visit  ?Medication Sig Dispense Refill  ? acetaminophen (TYLENOL) 325 MG tablet Take 2 tablets (650 mg total) by mouth every 6 (six) hours as needed for mild pain or headache (fever >/= 101).    ? amLODipine (NORVASC) 5 MG tablet Take 5 mg by mouth daily.    ? aspirin 81 MG EC tablet 1 tablet    ? Blood Glucose Monitoring Suppl (ONETOUCH VERIO FLEX SYSTEM) w/Device KIT 2 (two) times daily.  (Patient not taking: Reported on 02/09/2022)    ? carvedilol (COREG) 25 MG tablet Take 25 mg by mouth 2 (two) times daily.     ? Cyanocobalamin (VITAMIN B12) 1000 MCG TBCR daily.    ? empagliflozin (JARDIANCE) 10 MG TABS tablet Take 1 tablet (10 mg total) by mouth daily before breakfast. 30 tablet 6  ?  fenofibrate (TRICOR) 145 MG tablet Take 145 mg by mouth daily.    ? furosemide (LASIX) 20 MG tablet Take 1 tablet (20 mg total) by mouth daily. 30 tablet 11  ? hydrALAZINE (APRESOLINE) 25 MG tablet Take 25 mg by mouth 2 (two) times daily.    ? Lancets (ONETOUCH DELICA PLUS [id number]) MISC 3 (three) times daily.  (Patient not taking: Reported on 02/09/2022)    ? linagliptin (TRADJENTA) 5 MG TABS tablet Take 1 tablet (5 mg total) by mouth daily. 30 tablet 1  ? LYRICA 75 MG capsule Take 75 mg by mouth 3 (three) times daily.  1  ? ondansetron (ZOFRAN-ODT) 4 MG disintegrating tablet Take 1 tablet (4 mg total) by mouth every 8 (eight) hours as needed for nausea or vomiting. 20 tablet 0  ? ONETOUCH VERIO test strip 3 (three) times daily.  (Patient not taking: Reported on 02/09/2022)    ? pregabalin (LYRICA) 25 MG capsule Take 25 mg by mouth at bedtime.    ? pregabalin (LYRICA) 75 MG capsule Take 75 mg by mouth at bedtime.    ? spironolactone (ALDACTONE) 25 MG tablet Take 0.5 tablets (12.5 mg total) by mouth daily. 15 tablet 3  ? sucralfate (CARAFATE) 1 g tablet Take 1 tablet (1 g total) by mouth 4 (four) times daily as needed. 30 tablet 0  ? ?Current Facility-Administered Medications on File Prior to Visit  ?Medication Dose Route Frequency Provider Last Rate  Last Admin  ? triamcinolone acetonide (KENALOG) 10 MG/ML injection 10 mg  10 mg Other Once Landis Martins, DPM      ? ? ?Allergies  ?Allergen Reactions  ? Atorvastatin Other (See Comments)  ?  Cramps ?  ? Escitalopram Oxalate Other (See Comments)  ?  Insomnia, but patient currently takes this  ? Xanax [Alprazolam] Other (See Comments)  ?  "Felt like I had a hangover", HA  ? Codeine Nausea And Vomiting  ? Gabapentin Other (See Comments)  ? Niacin And Related Itching and Rash  ? Spironolactone Other (See Comments)  ?  hyperkalemia  ? ? ?Objective: ?Physical Exam ? ?General: Well developed, nourished, no acute distress, awake, alert and oriented x 3 ? ?Vascular: Dorsalis  pedis artery 2/4 bilateral, Posterior tibial artery 1/4 bilateral, skin temperature warm to warm proximal to distal bilateral lower extremities, no varicosities, pedal hair present bilateral.  Trace edema bilateral, unchanged from prior. ? ?Neurological: Gross sensation present via light touch bilateral. ? ?Dermatological: Skin is warm, dry, and supple bilateral, Nails 1-10 are elonagted thick, and discolored with mild subungal debris with most involved nails bilateral hallux with no acute ingrowing noted, no webspace macerations present bilateral, no open lesions present bilateral, no callus/corns. Dry skin plantar surfaces of both feet, improving in nature. No signs of infection bilateral. ? ?Musculoskeletal: No symptomatic boney deformities noted bilateral. Muscular strength within normal limits without pain on range of motion. No pain with calf compression bilateral. ? ? ?Assessment and Plan:  ?Problem List Items Addressed This Visit   ?None ?Visit Diagnoses   ? ? Pain due to onychomycosis of toenails of both feet    -  Primary  ? Diabetic polyneuropathy associated with type 2 diabetes mellitus ([place])      ? Foot pain, bilateral      ? ?  ? ?-Examined patient ?-Discussed the importance of daily foot inspection in the setting of diabetes ?-All painful mycotic nails x10 were mechanically debrided with sterile nail nipper without incident   ?-Continue with cardiology follow up  ?-Patient to return in  2.5-3 months for follow up evaluation/Diabetic nail care or sooner if symptoms worsen. ? ?Landis Martins, DPM  ?

## 2022-02-22 ENCOUNTER — Telehealth (HOSPITAL_COMMUNITY): Payer: Self-pay | Admitting: *Deleted

## 2022-02-22 NOTE — Telephone Encounter (Signed)
Auth request for cath submitted to [employer]. Clinical notes faxed.  ? ? ?

## 2022-02-24 ENCOUNTER — Other Ambulatory Visit: Payer: Self-pay

## 2022-02-24 ENCOUNTER — Ambulatory Visit (HOSPITAL_COMMUNITY)
Admission: RE | Admit: 2022-02-24 | Discharge: 2022-02-24 | Disposition: A | Discharge status: Home / Self Care | Payer: Medicare Other | Source: Ambulatory Visit | Attending: Internal Medicine | Admitting: Internal Medicine

## 2022-02-24 ENCOUNTER — Encounter (HOSPITAL_COMMUNITY)
Admission: RE | Disposition: A | Discharge status: Home / Self Care | Payer: Self-pay | Source: Ambulatory Visit | Attending: Internal Medicine

## 2022-02-24 DIAGNOSIS — I4819 Other persistent atrial fibrillation: Secondary | ICD-10-CM | POA: Insufficient documentation

## 2022-02-24 DIAGNOSIS — R0789 Other chest pain: Secondary | ICD-10-CM | POA: Diagnosis not present

## 2022-02-24 DIAGNOSIS — M329 Systemic lupus erythematosus, unspecified: Secondary | ICD-10-CM | POA: Insufficient documentation

## 2022-02-24 DIAGNOSIS — Z87891 Personal history of nicotine dependence: Secondary | ICD-10-CM | POA: Insufficient documentation

## 2022-02-24 DIAGNOSIS — Z7984 Long term (current) use of oral hypoglycemic drugs: Secondary | ICD-10-CM | POA: Insufficient documentation

## 2022-02-24 DIAGNOSIS — Z6830 Body mass index (BMI) 30.0-30.9, adult: Secondary | ICD-10-CM | POA: Diagnosis not present

## 2022-02-24 DIAGNOSIS — E1140 Type 2 diabetes mellitus with diabetic neuropathy, unspecified: Secondary | ICD-10-CM | POA: Insufficient documentation

## 2022-02-24 DIAGNOSIS — I2721 Secondary pulmonary arterial hypertension: Secondary | ICD-10-CM | POA: Diagnosis not present

## 2022-02-24 DIAGNOSIS — I2510 Atherosclerotic heart disease of native coronary artery without angina pectoris: Secondary | ICD-10-CM

## 2022-02-24 DIAGNOSIS — I110 Hypertensive heart disease with heart failure: Secondary | ICD-10-CM | POA: Diagnosis not present

## 2022-02-24 DIAGNOSIS — I5032 Chronic diastolic (congestive) heart failure: Secondary | ICD-10-CM | POA: Diagnosis not present

## 2022-02-24 DIAGNOSIS — I272 Pulmonary hypertension, unspecified: Secondary | ICD-10-CM

## 2022-02-24 HISTORY — PX: RIGHT/LEFT HEART CATH AND CORONARY ANGIOGRAPHY: CATH118266

## 2022-02-24 LAB — BASIC METABOLIC PANEL
Anion gap: 10 (ref 5–15)
BUN: 22 mg/dL (ref 8–23)
CO2: 24 mmol/L (ref 22–32)
Calcium: 10 mg/dL (ref 8.9–10.3)
Chloride: 106 mmol/L (ref 98–111)
Creatinine, Ser: 1.97 mg/dL — ABNORMAL HIGH (ref 0.44–1.00)
GFR, Estimated: 26 mL/min — ABNORMAL LOW (ref >60)
Glucose, Bld: 121 mg/dL — ABNORMAL HIGH (ref 70–99)
Potassium: 5.4 mmol/L — ABNORMAL HIGH (ref 3.5–5.1)
Sodium: 140 mmol/L (ref 135–145)

## 2022-02-24 LAB — POCT I-STAT 7, (LYTES, BLD GAS, ICA,H+H)
Acid-base deficit: 1 mmol/L (ref 0.0–2.0)
Bicarbonate: 23.9 mmol/L (ref 20.0–28.0)
Calcium, Ion: 1.26 mmol/L (ref 1.15–1.40)
HCT: 38 % (ref 36.0–46.0)
Hemoglobin: 12.9 g/dL (ref 12.0–15.0)
O2 Saturation: 91 %
Potassium: 4.7 mmol/L (ref 3.5–5.1)
Sodium: 141 mmol/L (ref 135–145)
TCO2: 25 mmol/L (ref 22–32)
pCO2 arterial: 38 mmHg (ref 32–48)
pH, Arterial: 7.407 (ref 7.35–7.45)
pO2, Arterial: 61 mmHg — ABNORMAL LOW (ref 83–108)

## 2022-02-24 LAB — POCT I-STAT EG7
Acid-Base Excess: 0 mmol/L (ref 0.0–2.0)
Acid-Base Excess: 1 mmol/L (ref 0.0–2.0)
Bicarbonate: 25.1 mmol/L (ref 20.0–28.0)
Bicarbonate: 26 mmol/L (ref 20.0–28.0)
Calcium, Ion: 1.3 mmol/L (ref 1.15–1.40)
Calcium, Ion: 1.31 mmol/L (ref 1.15–1.40)
HCT: 37 % (ref 36.0–46.0)
HCT: 38 % (ref 36.0–46.0)
Hemoglobin: 12.6 g/dL (ref 12.0–15.0)
Hemoglobin: 12.9 g/dL (ref 12.0–15.0)
O2 Saturation: 66 %
O2 Saturation: 68 %
Potassium: 4.6 mmol/L (ref 3.5–5.1)
Potassium: 4.8 mmol/L (ref 3.5–5.1)
Sodium: 140 mmol/L (ref 135–145)
Sodium: 142 mmol/L (ref 135–145)
TCO2: 26 mmol/L (ref 22–32)
TCO2: 27 mmol/L (ref 22–32)
pCO2, Ven: 40.9 mmHg — ABNORMAL LOW (ref 44–60)
pCO2, Ven: 42.8 mmHg — ABNORMAL LOW (ref 44–60)
pH, Ven: 7.392 (ref 7.25–7.43)
pH, Ven: 7.396 (ref 7.25–7.43)
pO2, Ven: 35 mmHg (ref 32–45)
pO2, Ven: 36 mmHg (ref 32–45)

## 2022-02-24 LAB — CBC
HCT: 41.1 % (ref 36.0–46.0)
Hemoglobin: 12.5 g/dL (ref 12.0–15.0)
MCH: 24.1 pg — ABNORMAL LOW (ref 26.0–34.0)
MCHC: 30.4 g/dL (ref 30.0–36.0)
MCV: 79.2 fL — ABNORMAL LOW (ref 80.0–100.0)
Platelets: 362 10*3/uL (ref 150–400)
RBC: 5.19 MIL/uL — ABNORMAL HIGH (ref 3.87–5.11)
RDW: 14.6 % (ref 11.5–15.5)
WBC: 4.7 10*3/uL (ref 4.0–10.5)
nRBC: 0 % (ref 0.0–0.2)

## 2022-02-24 LAB — GLUCOSE, CAPILLARY: Glucose-Capillary: 121 mg/dL — ABNORMAL HIGH (ref 70–99)

## 2022-02-24 SURGERY — RIGHT/LEFT HEART CATH AND CORONARY ANGIOGRAPHY
Anesthesia: LOCAL

## 2022-02-24 MED ORDER — HEPARIN SODIUM (PORCINE) 1000 UNIT/ML IJ SOLN
INTRAMUSCULAR | Status: DC | PRN
  Administered 2022-02-24: 4500 [IU] via INTRAVENOUS

## 2022-02-24 MED ORDER — METHYLPREDNISOLONE SODIUM SUCC 125 MG IJ SOLR
125.0000 mg | Freq: Once | INTRAMUSCULAR | Status: AC
  Administered 2022-02-24: 125 mg via INTRAVENOUS

## 2022-02-24 MED ORDER — VERAPAMIL HCL 2.5 MG/ML IV SOLN
INTRAVENOUS | Status: AC
  Filled 2022-02-24: qty 2

## 2022-02-24 MED ORDER — SODIUM CHLORIDE 0.9% FLUSH: 3.0000 mL | Freq: Two times a day (BID) | INTRAVENOUS | Status: DC

## 2022-02-24 MED ORDER — METHYLPREDNISOLONE SODIUM SUCC 125 MG IJ SOLR
INTRAMUSCULAR | Status: AC
  Filled 2022-02-24: qty 2

## 2022-02-24 MED ORDER — SODIUM CHLORIDE 0.9% FLUSH: 3.0000 mL | INTRAVENOUS | Status: DC | PRN

## 2022-02-24 MED ORDER — ONDANSETRON HCL 4 MG/2ML IJ SOLN: 4.0000 mg | Freq: Four times a day (QID) | INTRAMUSCULAR | Status: DC | PRN

## 2022-02-24 MED ORDER — SODIUM CHLORIDE 0.9 % IV SOLN: 250.0000 mL | INTRAVENOUS | Status: DC | PRN

## 2022-02-24 MED ORDER — DIPHENHYDRAMINE HCL 50 MG/ML IJ SOLN
INTRAMUSCULAR | Status: AC
  Administered 2022-02-24: 25 mg via INTRAVENOUS
  Filled 2022-02-24: qty 1

## 2022-02-24 MED ORDER — FENTANYL CITRATE (PF) 100 MCG/2ML IJ SOLN
INTRAMUSCULAR | Status: AC
  Filled 2022-02-24: qty 2

## 2022-02-24 MED ORDER — MIDAZOLAM HCL 2 MG/2ML IJ SOLN
INTRAMUSCULAR | Status: AC
  Filled 2022-02-24: qty 2

## 2022-02-24 MED ORDER — FENTANYL CITRATE (PF) 100 MCG/2ML IJ SOLN
INTRAMUSCULAR | Status: DC | PRN
  Administered 2022-02-24: 25 ug via INTRAVENOUS

## 2022-02-24 MED ORDER — HYDRALAZINE HCL 20 MG/ML IJ SOLN: 10.0000 mg | INTRAMUSCULAR | Status: DC | PRN

## 2022-02-24 MED ORDER — LIDOCAINE HCL (PF) 1 % IJ SOLN
INTRAMUSCULAR | Status: DC | PRN
  Administered 2022-02-24 (×2): 2 mL via INTRADERMAL

## 2022-02-24 MED ORDER — HEPARIN (PORCINE) IN NACL 1000-0.9 UT/500ML-% IV SOLN
INTRAVENOUS | Status: DC | PRN
  Administered 2022-02-24 (×2): 500 mL

## 2022-02-24 MED ORDER — SODIUM CHLORIDE 0.9 % IV SOLN: INTRAVENOUS | Status: AC

## 2022-02-24 MED ORDER — LIDOCAINE HCL (PF) 1 % IJ SOLN
INTRAMUSCULAR | Status: AC
Start: 2022-02-24 — End: ?
  Filled 2022-02-24: qty 30

## 2022-02-24 MED ORDER — LABETALOL HCL 5 MG/ML IV SOLN: 10.0000 mg | INTRAVENOUS | Status: DC | PRN

## 2022-02-24 MED ORDER — HEPARIN SODIUM (PORCINE) 1000 UNIT/ML IJ SOLN
INTRAMUSCULAR | Status: AC
  Filled 2022-02-24: qty 10

## 2022-02-24 MED ORDER — HEPARIN (PORCINE) IN NACL 1000-0.9 UT/500ML-% IV SOLN
INTRAVENOUS | Status: AC
Start: 2022-02-24 — End: ?
  Filled 2022-02-24: qty 500

## 2022-02-24 MED ORDER — IOHEXOL 350 MG/ML SOLN
INTRAVENOUS | Status: DC | PRN
  Administered 2022-02-24: 90 mL via INTRA_ARTERIAL

## 2022-02-24 MED ORDER — DIPHENHYDRAMINE HCL 50 MG/ML IJ SOLN: 25.0000 mg | Freq: Once | INTRAMUSCULAR | Status: AC

## 2022-02-24 MED ORDER — MIDAZOLAM HCL 2 MG/2ML IJ SOLN
INTRAMUSCULAR | Status: DC | PRN
  Administered 2022-02-24: 1 mg via INTRAVENOUS

## 2022-02-24 MED ORDER — VERAPAMIL HCL 2.5 MG/ML IV SOLN
INTRAVENOUS | Status: DC | PRN
  Administered 2022-02-24: 10 mL via INTRA_ARTERIAL

## 2022-02-24 MED ORDER — SODIUM CHLORIDE 0.9 % IV SOLN: INTRAVENOUS | Status: DC

## 2022-02-24 MED ORDER — ACETAMINOPHEN 325 MG PO TABS: 650.0000 mg | ORAL_TABLET | ORAL | Status: DC | PRN

## 2022-02-24 MED ORDER — HEPARIN (PORCINE) IN NACL 1000-0.9 UT/500ML-% IV SOLN
INTRAVENOUS | Status: AC
  Filled 2022-02-24: qty 500

## 2022-02-24 SURGICAL SUPPLY — 14 items
CATH 5FR JL3.5 JR4 ANG PIG MP (CATHETERS) ×1 IMPLANT
CATH BALLN WEDGE 5F 110CM (CATHETERS) ×1 IMPLANT
CATH INFINITI 5 FR 3DRC (CATHETERS) ×1 IMPLANT
CATH INFINITI 5 FR LCB (CATHETERS) ×1 IMPLANT
CATH INFINITI 5FR AL1 (CATHETERS) ×1 IMPLANT
DEVICE RAD COMP TR BAND LRG (VASCULAR PRODUCTS) ×1 IMPLANT
GLIDESHEATH SLEND SS 6F .021 (SHEATH) ×1 IMPLANT
GUIDEWIRE .025 260CM (WIRE) ×1 IMPLANT
GUIDEWIRE INQWIRE 1.5J.035X260 (WIRE) IMPLANT
INQWIRE 1.5J .035X260CM (WIRE) ×2
KIT HEART LEFT (KITS) ×1 IMPLANT
PACK CARDIAC CATHETERIZATION (CUSTOM PROCEDURE TRAY) ×2 IMPLANT
SHEATH GLIDE SLENDER 4/5FR (SHEATH) ×1 IMPLANT
TRANSDUCER W/STOPCOCK (MISCELLANEOUS) ×2 IMPLANT

## 2022-02-24 NOTE — Interval H&P Note (Signed)
History and Physical Interval Note: ? ?02/24/2022 ?9:47 AM ? ?Deborah Weeks  has presented today for surgery, with the diagnosis of pulmonary HTN.  The various methods of treatment have been discussed with the patient and family. After consideration of risks, benefits and other options for treatment, the patient has consented to  Procedure(s): ?RIGHT/LEFT HEART CATH AND CORONARY ANGIOGRAPHY (N/A) and possible coronary angioplasty as a surgical intervention.  The patient's history has been reviewed, patient examined, no change in status, stable for surgery.  I have reviewed the patient's chart and labs.  Questions were answered to the patient's satisfaction.   ? ? ?[name] ? ? ?

## 2022-02-24 NOTE — Discharge Instructions (Signed)

## 2022-02-24 NOTE — Progress Notes (Signed)
Patient and daughter was given discharge instructions. Both verbalized understanding. 

## 2022-02-25 ENCOUNTER — Encounter (HOSPITAL_COMMUNITY): Payer: Self-pay | Admitting: Internal Medicine

## 2022-03-02 ENCOUNTER — Ambulatory Visit (INDEPENDENT_AMBULATORY_CARE_PROVIDER_SITE_OTHER): Payer: Medicare Other | Admitting: Emergency Medicine

## 2022-03-02 ENCOUNTER — Encounter: Payer: Self-pay | Admitting: Emergency Medicine

## 2022-03-02 VITALS — BP 118/76 | HR 56 | Temp 97.8°F | Ht 69.0 in | Wt 205.4 lb

## 2022-03-02 DIAGNOSIS — J9611 Chronic respiratory failure with hypoxia: Secondary | ICD-10-CM

## 2022-03-02 DIAGNOSIS — J449 Chronic obstructive pulmonary disease, unspecified: Secondary | ICD-10-CM

## 2022-03-02 DIAGNOSIS — I2729 Other secondary pulmonary hypertension: Secondary | ICD-10-CM | POA: Diagnosis not present

## 2022-03-02 NOTE — Progress Notes (Unsigned)
Subjective:    Patient ID: Deborah Weeks, female    DOB: 1946/01/18, 76 y.o.   MRN: [id number]  HPI  ROV 01/15/22 --76 year old woman, former smoker with multifactorial dyspnea.  She has a history of SLE, hyperlipidemia, diabetes.  Notably she has hypertension with diastolic CHF.  I saw her with progressive dyspnea on 3/21, chest x-ray 3/16 with increased pulmonary infiltrates, effusions.  This was right after her Lasix had been down adjusted.  Her Lasix had been restarted right around the time of her visit. Her eval has since revealed severe PAH as below.   She has some cough and wheeze when exposed to pollen, when she has a URI.  She feels that her breathing is back to baseline, she is able to do housework.  She is not on any BD's. She is not using any O2, but has it available at home from prior acute flares.    Gated Lexi scan stress test 01/12/22 was a low risk study with a normal EF 73%  TTE was performed 01/12/2022 and showed intact left ventricular function, a mildly enlarged RV, mild to moderate MR, severe PAH PASP 75 mmHg.   ROV 03/02/22 --Deborah Weeks is a 64 with multifactorial significant pulmonary hypertension, exertional hypoxemia.  Has been seen by Dr. Haroldine Weeks in cardiology. We performed pulmonary function testing as below, ventilation/perfusion scan. She reports that she is doing  She wants a new POC from [place], had been working w [employer] but has not been able to obtain  VQ scan 01/27/2022 without any evidence to support pulmonary embolism  Pulmonary function testing performed 01/29/2022 reviewed by me shows principally restriction but some possible superimposed obstruction on spirometry, restricted volumes, no bronchodilator response, decreased diffusion capacity that corrects to the normal range when adjusted for alveolar volume.  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  Normal LV function.  Mild to moderate PAH 58-8  (38), PA OP 15, PVR 3.4.  Overnight oximetry reviewed.  Done on 01/21/2022 on room air.  Documented significant desaturations, 5 hours and 43 minutes spent below 88%.  Clearly qualifies for supplemental oxygen.       Review of Systems As per HPI  Past Medical History:  Diagnosis Date   Arthritis    Diabetes mellitus without complication (HCC)    High cholesterol    Hypertension    Lupus ([place])    Neuropathy      Family History  Problem Relation Age of Onset   Diabetes Mother    Hypertension Mother    Diabetes Father    Hypertension Father    Breast cancer Cousin    Alpha-1 antitrypsin deficiency Neg Hx    Lupus Neg Hx    Neurofibromatosis Neg Hx    Coronary artery disease Neg Hx      Social History   Socioeconomic History   Marital status: Single    Spouse name: Not on file   Number of children: Not on file   Years of education: Not on file   Highest education level: Not on file  Occupational History   Not on file  Tobacco Use   Smoking status: Former    Packs/day: 1.00    Years: 40.00    Pack years: 40.00    Types: Cigarettes    Quit date: 10/11/2016    Years since quitting: 5.3   Smokeless tobacco: Never  Vaping Use   Vaping Use: Never used  Substance and Sexual Activity   Alcohol use: Never    Alcohol/week: 0.0 standard drinks   Drug use: Never   Sexual activity: Not on file  Other Topics Concern   Not on file  Social History Narrative   Not on file   Social Determinants of Health   Financial Resource Strain: Not on file  Food Insecurity: Not on file  Transportation Needs: Not on file  Physical Activity: Not on file  Stress: Not on file  Social Connections: Not on file  Intimate Partner Violence: Not on file     Allergies  Allergen Reactions   Lexapro [Escitalopram Oxalate] Other (See Comments)    Insomnia, but patient currently takes this   Lipitor [Atorvastatin] Other (See Comments)    Cramps    Iodinated Contrast Media Nausea And  Vomiting   Xanax [Alprazolam] Other (See Comments)    "Felt like I had a hangover", HA   Codeine Nausea And Vomiting   Neurontin [Gabapentin] Other (See Comments)    Makes pt shake    Niacin And Related Itching and Rash   Spironolactone Other (See Comments)    hyperkalemia     Outpatient Medications Prior to Visit  Medication Sig Dispense Refill   acetaminophen (TYLENOL) 500 MG tablet Take 1,000 mg by mouth every 8 (eight) hours as needed.     amLODipine (NORVASC) 10 MG tablet Take 10 mg by mouth daily.     aspirin 81 MG EC tablet Take 81 mg by mouth daily.     Blood Glucose Monitoring Suppl (ONETOUCH VERIO FLEX SYSTEM) w/Device KIT 2 (two) times daily.     carvedilol (COREG) 25 MG tablet Take 25 mg by mouth 2 (two) times daily.      Cyanocobalamin (VITAMIN B12) 1000 MCG TBCR Take 1,000 mcg by mouth daily.     empagliflozin (JARDIANCE) 10 MG TABS tablet Take 1 tablet (10 mg total) by mouth daily before breakfast. 30 tablet 6   fenofibrate (TRICOR) 145 MG tablet Take 145 mg by mouth daily.     furosemide (LASIX) 20 MG tablet Take 1 tablet (20 mg total) by mouth daily. 30 tablet 11   hydrALAZINE (APRESOLINE) 25 MG tablet Take 25 mg by mouth 2 (two) times daily.     Lancets (ONETOUCH DELICA PLUS [id number]) MISC 3 (three) times daily.     linagliptin (TRADJENTA) 5 MG TABS tablet Take 1 tablet (5 mg total) by mouth daily. 30 tablet 1   ondansetron (ZOFRAN-ODT) 4 MG disintegrating tablet Take 1 tablet (4 mg total) by mouth every 8 (eight) hours as needed for nausea or vomiting. 20 tablet 0   ONETOUCH VERIO test strip 3 (three) times daily.     pregabalin (LYRICA) 25 MG capsule Take 25 mg by mouth at bedtime. Takes with 75 mg capsule at bedtime     pregabalin (LYRICA) 75 MG capsule Take 75 mg by mouth 3 (three) times daily.     spironolactone (ALDACTONE) 25 MG tablet Take 0.5 tablets (12.5 mg total) by mouth daily. 15 tablet 3   sucralfate (CARAFATE) 1 g tablet Take 1 tablet (1 g total) by  mouth 4 (four) times daily as needed. 30 tablet 0   Facility-Administered Medications Prior to Visit  Medication Dose Route Frequency Provider Last Rate Last Admin   triamcinolone acetonide (KENALOG) 10 MG/ML injection 10 mg  10 mg Other Once Landis Martins, DPM            Objective:   Physical Exam  Vitals:   03/02/22 1152  BP: 118/76  Pulse: (!) 56  Temp: 97.8 F (36.6 C)  TempSrc: Oral  SpO2: 93%  Weight: 205 lb 6.4 oz (93.2 kg)  Height: 5' 9" (1.753 m)    Gen: Pleasant, well-nourished, in no distress,  normal affect  ENT: No lesions,  mouth clear,  oropharynx clear, no postnasal drip  Neck: No JVD, no stridor  Lungs: No use of accessory muscles, B insp crackles, no wheezing on normal respiration, no wheeze on forced expiration  Cardiovascular: RRR, heart sounds normal, no murmur or gallops, no peripheral edema  Musculoskeletal: No deformities, no cyanosis or clubbing  Neuro: alert, awake, non focal  Skin: Warm, no lesions or rash     Assessment & Plan:  Other secondary pulmonary hypertension (HCC) Right heart cath data reviewed, mild to moderate elevated PAP.  A VQ scan was reassuring.  Pulmonary function testing with principally restriction, question some component of obstruction and mild COPD.  She would hold off on treating obstruction for now.  We are starting supplemental oxygen.  She would benefit from CPAP if she has OSA.  She is hesitant to get a sleep study.  Continue her current diuretic regimen and cardiology follow-up  COPD (chronic obstructive pulmonary disease) ([place]) Discussed possible BD with her today.  She wants to hold off for now  Chronic respiratory failure with hypoxia ([place]) She qualifies for exertional oxygen and also overnight oxygen.  We will start 2 L/min.  Likely repeat her overnight oximetry on 2 L to confirm that it is adequate.  Probably needs a sleep study.  We will have to revisit this with her   Baltazar Apo, MD, PhD 03/03/2022,  5:37 PM [facility] [phone] or if no answer before 7:00PM call [phone] For any issues after 7:00PM please call eLink [phone]

## 2022-03-02 NOTE — Patient Instructions (Signed)
Based on your overnight oximetry test you need to start wearing oxygen all night while sleeping at 2 L/min.  We will probably repeat your overnight oximetry in the future on 2 L to make sure this is adequate. We will repeat your walking oximetry test today and order a portable oxygen concentrator through [facility]. Continue diuretics as ordered by Dr. Mannie Stabile, Dr. Haroldine Laws and cardiology We talked about possibly starting an inhaled medication today.  For now we will hold off.  We could revisit this going forward depending on how your breathing is doing. Follow with Dr Lamonte Sakai in 6 months or sooner if you have any problems

## 2022-03-03 ENCOUNTER — Telehealth: Payer: Self-pay | Admitting: Emergency Medicine

## 2022-03-03 NOTE — Telephone Encounter (Signed)
Order was placed after pt's OV 5/23 with [facility] for O2 and to have DME [facility]. Called and spoke with pt letting her know this had been done and she verbalized understanding. Nothing further needed.

## 2022-03-03 NOTE — Assessment & Plan Note (Signed)
Discussed possible BD with her today.  She wants to hold off for now

## 2022-03-03 NOTE — Assessment & Plan Note (Addendum)
Right heart cath data reviewed, mild to moderate elevated PAP.  A VQ scan was reassuring.  Pulmonary function testing with principally restriction, question some component of obstruction and mild COPD.  She would hold off on treating obstruction for now.  We are starting supplemental oxygen.  She would benefit from CPAP if she has OSA.  She is hesitant to get a sleep study.  Continue her current diuretic regimen and cardiology follow-up

## 2022-03-03 NOTE — Assessment & Plan Note (Addendum)
She qualifies for exertional oxygen and also overnight oxygen.  We will start 2 L/min.  Likely repeat her overnight oximetry on 2 L to confirm that it is adequate.  Probably needs a sleep study.  We will have to revisit this with her

## 2022-03-09 DIAGNOSIS — E875 Hyperkalemia: Secondary | ICD-10-CM | POA: Diagnosis not present

## 2022-03-09 DIAGNOSIS — R82998 Other abnormal findings in urine: Secondary | ICD-10-CM | POA: Diagnosis not present

## 2022-03-09 DIAGNOSIS — N179 Acute kidney failure, unspecified: Secondary | ICD-10-CM | POA: Diagnosis not present

## 2022-03-09 DIAGNOSIS — E1122 Type 2 diabetes mellitus with diabetic chronic kidney disease: Secondary | ICD-10-CM | POA: Diagnosis not present

## 2022-03-09 DIAGNOSIS — I129 Hypertensive chronic kidney disease with stage 1 through stage 4 chronic kidney disease, or unspecified chronic kidney disease: Secondary | ICD-10-CM | POA: Diagnosis not present

## 2022-03-09 DIAGNOSIS — N1832 Chronic kidney disease, stage 3b: Secondary | ICD-10-CM | POA: Diagnosis not present

## 2022-03-09 DIAGNOSIS — N200 Calculus of kidney: Secondary | ICD-10-CM | POA: Diagnosis not present

## 2022-03-15 DIAGNOSIS — J449 Chronic obstructive pulmonary disease, unspecified: Secondary | ICD-10-CM | POA: Diagnosis not present

## 2022-04-14 DIAGNOSIS — J449 Chronic obstructive pulmonary disease, unspecified: Secondary | ICD-10-CM | POA: Diagnosis not present

## 2022-04-29 ENCOUNTER — Other Ambulatory Visit (HOSPITAL_COMMUNITY): Payer: Self-pay

## 2022-04-29 DIAGNOSIS — I5032 Chronic diastolic (congestive) heart failure: Secondary | ICD-10-CM

## 2022-04-29 NOTE — Progress Notes (Signed)
Orders Placed This Encounter  Procedures   ECHOCARDIOGRAM COMPLETE    Standing Status:   Future    Standing Expiration Date:   04/30/2023    Order Specific Question:   Where should this test be performed    Answer:   [facility]    Order Specific Question:   Perflutren DEFINITY (image enhancing agent) should be administered unless hypersensitivity or allergy exist    Answer:   Administer Perflutren    Order Specific Question:   Reason for exam-Echo    Answer:   Congestive Heart Failure  I50.9    Order Specific Question:   Release to patient    Answer:   Immediate

## 2022-05-13 ENCOUNTER — Other Ambulatory Visit (HOSPITAL_COMMUNITY): Payer: Self-pay

## 2022-05-13 NOTE — Progress Notes (Signed)
error 

## 2022-05-15 DIAGNOSIS — J449 Chronic obstructive pulmonary disease, unspecified: Secondary | ICD-10-CM | POA: Diagnosis not present

## 2022-05-20 ENCOUNTER — Ambulatory Visit (HOSPITAL_COMMUNITY)
Admission: RE | Admit: 2022-05-20 | Discharge: 2022-05-20 | Disposition: A | Discharge status: Home / Self Care | Payer: Medicare Other | Source: Ambulatory Visit | Attending: Internal Medicine | Admitting: Internal Medicine

## 2022-05-20 ENCOUNTER — Other Ambulatory Visit (HOSPITAL_COMMUNITY): Payer: Self-pay

## 2022-05-20 ENCOUNTER — Ambulatory Visit (HOSPITAL_BASED_OUTPATIENT_CLINIC_OR_DEPARTMENT_OTHER)
Admission: RE | Admit: 2022-05-20 | Discharge: 2022-05-20 | Disposition: A | Discharge status: Home / Self Care | Payer: Medicare Other | Source: Ambulatory Visit | Attending: Internal Medicine | Admitting: Internal Medicine

## 2022-05-20 ENCOUNTER — Encounter (HOSPITAL_COMMUNITY): Payer: Self-pay | Admitting: Internal Medicine

## 2022-05-20 VITALS — BP 140/80 | HR 58 | Wt 210.0 lb

## 2022-05-20 DIAGNOSIS — I272 Pulmonary hypertension, unspecified: Secondary | ICD-10-CM | POA: Diagnosis not present

## 2022-05-20 DIAGNOSIS — I5032 Chronic diastolic (congestive) heart failure: Secondary | ICD-10-CM

## 2022-05-20 DIAGNOSIS — Z8616 Personal history of COVID-19: Secondary | ICD-10-CM | POA: Insufficient documentation

## 2022-05-20 DIAGNOSIS — Z7901 Long term (current) use of anticoagulants: Secondary | ICD-10-CM | POA: Insufficient documentation

## 2022-05-20 DIAGNOSIS — I340 Nonrheumatic mitral (valve) insufficiency: Secondary | ICD-10-CM | POA: Diagnosis not present

## 2022-05-20 DIAGNOSIS — Z79899 Other long term (current) drug therapy: Secondary | ICD-10-CM | POA: Insufficient documentation

## 2022-05-20 DIAGNOSIS — Z7984 Long term (current) use of oral hypoglycemic drugs: Secondary | ICD-10-CM | POA: Insufficient documentation

## 2022-05-20 DIAGNOSIS — I480 Paroxysmal atrial fibrillation: Secondary | ICD-10-CM | POA: Diagnosis not present

## 2022-05-20 DIAGNOSIS — E785 Hyperlipidemia, unspecified: Secondary | ICD-10-CM | POA: Insufficient documentation

## 2022-05-20 DIAGNOSIS — Z87891 Personal history of nicotine dependence: Secondary | ICD-10-CM | POA: Diagnosis not present

## 2022-05-20 DIAGNOSIS — E1140 Type 2 diabetes mellitus with diabetic neuropathy, unspecified: Secondary | ICD-10-CM | POA: Insufficient documentation

## 2022-05-20 DIAGNOSIS — I2510 Atherosclerotic heart disease of native coronary artery without angina pectoris: Secondary | ICD-10-CM | POA: Insufficient documentation

## 2022-05-20 DIAGNOSIS — I517 Cardiomegaly: Secondary | ICD-10-CM

## 2022-05-20 DIAGNOSIS — I10 Essential (primary) hypertension: Secondary | ICD-10-CM | POA: Diagnosis not present

## 2022-05-20 DIAGNOSIS — I110 Hypertensive heart disease with heart failure: Secondary | ICD-10-CM | POA: Insufficient documentation

## 2022-05-20 LAB — ECHOCARDIOGRAM COMPLETE
AR max vel: 2.74 cm2
AV Peak grad: 4.3 mmHg
Ao pk vel: 1.04 m/s
Area-P 1/2: 4.71 cm2
MV M vel: 4.34 m/s
MV Peak grad: 75.3 mmHg
S' Lateral: 2.6 cm

## 2022-05-20 LAB — COMPREHENSIVE METABOLIC PANEL
ALT: 15 U/L (ref 0–44)
AST: 18 U/L (ref 15–41)
Albumin: 4.4 g/dL (ref 3.5–5.0)
Alkaline Phosphatase: 37 U/L — ABNORMAL LOW (ref 38–126)
Anion gap: 9 (ref 5–15)
BUN: 22 mg/dL (ref 8–23)
CO2: 27 mmol/L (ref 22–32)
Calcium: 10.2 mg/dL (ref 8.9–10.3)
Chloride: 104 mmol/L (ref 98–111)
Creatinine, Ser: 1.79 mg/dL — ABNORMAL HIGH (ref 0.44–1.00)
GFR, Estimated: 29 mL/min — ABNORMAL LOW (ref >60)
Glucose, Bld: 117 mg/dL — ABNORMAL HIGH (ref 70–99)
Potassium: 4.4 mmol/L (ref 3.5–5.1)
Sodium: 140 mmol/L (ref 135–145)
Total Bilirubin: 0.6 mg/dL (ref 0.3–1.2)
Total Protein: 8 g/dL (ref 6.5–8.1)

## 2022-05-20 LAB — CBC
HCT: 45.5 % (ref 36.0–46.0)
Hemoglobin: 14.1 g/dL (ref 12.0–15.0)
MCH: 24.1 pg — ABNORMAL LOW (ref 26.0–34.0)
MCHC: 31 g/dL (ref 30.0–36.0)
MCV: 77.6 fL — ABNORMAL LOW (ref 80.0–100.0)
Platelets: 330 10*3/uL (ref 150–400)
RBC: 5.86 MIL/uL — ABNORMAL HIGH (ref 3.87–5.11)
RDW: 18.6 % — ABNORMAL HIGH (ref 11.5–15.5)
WBC: 5.8 10*3/uL (ref 4.0–10.5)
nRBC: 0 % (ref 0.0–0.2)

## 2022-05-20 LAB — BRAIN NATRIURETIC PEPTIDE: B Natriuretic Peptide: 315.6 pg/mL — ABNORMAL HIGH (ref 0.0–100.0)

## 2022-05-20 MED ORDER — APIXABAN 5 MG PO TABS: 5.0000 mg | ORAL_TABLET | Freq: Two times a day (BID) | ORAL | 6 refills | Status: AC

## 2022-05-20 NOTE — Progress Notes (Signed)
ADVANCED [facility] CLINIC  NOTE  Referring Physician:Kathryn Grandville Silos, PA-C Primary Care:  Caren Macadam, MD Primary Cardiologist: Candee Furbish  HPI:  Deborah Weeks is a 76 y.o. female with a hx of morbid obesity, HTN, DM2 with neuropathy, lupus, chronic diastolic CHF, previous tobacco abuse, PAF and severe pulmonary hypertension by echo 3/23 who is referred by Nell Range, PA-C for further evaluation of dyspnea and PH.   The patient had a nuclear stress test in 07/2018.  EF 71% with no wall motion abnormalities.  The study was felt to be normal with no significant perfusion defects or ischemia.     She was admitted to the hospital in 10/2018 with sepsis/acute respiratory failure with hypoxia secondary to PNA and a/c diastolic HF with PAF. Echo EF 60-65% intdeterminate DD.  She was not started on anticoagulation due to an episode of hemoptysis during this admission.     She wore a heart monitor after this admission which showed no atrial fibrillation.  Since she had an isolated episode of A-fib in the setting of acute illness with no evidence for recurrence, it was decided to defer long-term oral anticoagulation.   She was admitted in 10/2019 with COVID and treated with remdesivir. Since then she has been on PRN 02.    Seen 3/23 by Nell Range, PA-C. For worsening dyspnea. Follow up CXR showed pulmonary infiltrates with effusions and labs with creat 1.58 (likely baseline- first lab since 09/2019), nt pro bnp 882. Lasix restarted. Echo ordered referred to Dr. Lamonte Sakai and [place].    She saw Dr. Brock Ra on 12/29/21 who asked her to continue use 2 L/min until we have her volume optimized. He felt that she was at risk for interstitial lung disease given her history of SLE and the COVID-19 in 2021.    Nuclear stress test 01/12/22 was low risk with an EF of 73%   2D echo 01/12/22 showed EF 65-70%, severe LAE, with restrictive filling pattern E/E" 29.2 mildly enlarged RV, severe pulm HTN 74.6 mm hg and  mild to mod MR.    She was seen back by Dr. Lamonte Sakai on 01/15/2022 who felt her pulmonary hypertension was multifactorial.  He ordered a nocturnal oximetry, PFTs and a VQ scan   VQ scan 01/28/2022: normal PFT 01/29/22: FEV1 1/29 (66%) FVC 1.62 (64% DLCO 62%  R/LHC 02/24/22:  1 v CAD with 70% mRCA (med management recommended)  Findings:   Ao = 136/56 (90) LV = 140/16 RA = 2 RV = 59/6 PA = 58/8 (38) PCW = 15 (v = 26) Fick cardiac output/index = 6.8/3.3 PVR = 3.4 WU SVR = 1,034 FA sat = 91% PA sat = 66%, 67%   Mild to moderate mixed PAH (likely WHO groups 1 &2, primarily group 2)   I saw her for initial visit in HF clinic 05/23. Volume elevated. Started on [place]. Referred for [facility].   Here with her daughter for routine f/u. Feels much better. Breathing improved. Only mild DOE. No edema, orthopnea or PND. BP has been well controlled at home.   Echo today EF 65-70%, moderate LVH, grade III DD, RV okay, TR signal inadequate to assess PA pressure, sever LAE. No snoring.    Past Medical History:  Diagnosis Date   Arthritis    Diabetes mellitus without complication (HCC)    High cholesterol    Hypertension    Lupus ([place])    Neuropathy     Current Outpatient Medications  Medication Sig Dispense  Refill   acetaminophen (TYLENOL) 500 MG tablet Take 1,000 mg by mouth every 8 (eight) hours as needed.     amLODipine (NORVASC) 5 MG tablet Take 5 mg by mouth daily.     aspirin 81 MG EC tablet Take 81 mg by mouth daily.     Blood Glucose Monitoring Suppl (ONETOUCH VERIO FLEX SYSTEM) w/Device KIT 2 (two) times daily.     carvedilol (COREG) 25 MG tablet Take 25 mg by mouth 2 (two) times daily.      Cholecalciferol (VITAMIN D3 PO) Take 1,000 mg by mouth daily in the afternoon.     Cyanocobalamin (VITAMIN B12) 1000 MCG TBCR Take 1,000 mcg by mouth daily.     empagliflozin (JARDIANCE) 10 MG TABS tablet Take 1 tablet (10 mg total) by mouth daily before breakfast. 30 tablet 6    fenofibrate (TRICOR) 145 MG tablet Take 145 mg by mouth daily.     furosemide (LASIX) 20 MG tablet Take 1 tablet (20 mg total) by mouth daily. 30 tablet 11   hydrALAZINE (APRESOLINE) 25 MG tablet Take 25 mg by mouth 2 (two) times daily.     Lancets (ONETOUCH DELICA PLUS [id number]) MISC 3 (three) times daily.     linagliptin (TRADJENTA) 5 MG TABS tablet Take 1 tablet (5 mg total) by mouth daily. 30 tablet 1   ondansetron (ZOFRAN-ODT) 4 MG disintegrating tablet Take 1 tablet (4 mg total) by mouth every 8 (eight) hours as needed for nausea or vomiting. 20 tablet 0   ONETOUCH VERIO test strip 3 (three) times daily.     pregabalin (LYRICA) 75 MG capsule Take 75 mg by mouth 2 (two) times daily.     pregabalin (LYRICA) 75 MG capsule Take 100 mg by mouth at bedtime.     spironolactone (ALDACTONE) 25 MG tablet Take 0.5 tablets (12.5 mg total) by mouth daily. 15 tablet 3   Current Facility-Administered Medications  Medication Dose Route Frequency Provider Last Rate Last Admin   triamcinolone acetonide (KENALOG) 10 MG/ML injection 10 mg  10 mg Other Once Landis Martins, DPM        Allergies  Allergen Reactions   Lexapro [Escitalopram Oxalate] Other (See Comments)    Insomnia, but patient currently takes this   Lipitor [Atorvastatin] Other (See Comments)    Cramps    Iodinated Contrast Media Nausea And Vomiting   Xanax [Alprazolam] Other (See Comments)    "Felt like I had a hangover", HA   Codeine Nausea And Vomiting   Neurontin [Gabapentin] Other (See Comments)    Makes pt shake    Niacin And Related Itching and Rash   Spironolactone Other (See Comments)    hyperkalemia      Social History   Socioeconomic History   Marital status: Single    Spouse name: Not on file   Number of children: Not on file   Years of education: Not on file   Highest education level: Not on file  Occupational History   Not on file  Tobacco Use   Smoking status: Former    Packs/day: 1.00    Years: 40.00     Total pack years: 40.00    Types: Cigarettes    Quit date: 10/11/2016    Years since quitting: 5.6   Smokeless tobacco: Never  Vaping Use   Vaping Use: Never used  Substance and Sexual Activity   Alcohol use: Never    Alcohol/week: 0.0 standard drinks of alcohol   Drug use: Never  Sexual activity: Not on file  Other Topics Concern   Not on file  Social History Narrative   Not on file   Social Determinants of Health   Financial Resource Strain: Not on file  Food Insecurity: Not on file  Transportation Needs: Not on file  Physical Activity: Not on file  Stress: Not on file  Social Connections: Not on file  Intimate Partner Violence: Not on file      Family History  Problem Relation Age of Onset   Diabetes Mother    Hypertension Mother    Diabetes Father    Hypertension Father    Breast cancer Cousin    Alpha-1 antitrypsin deficiency Neg Hx    Lupus Neg Hx    Neurofibromatosis Neg Hx    Coronary artery disease Neg Hx     Vitals:   05/20/22 1143  BP: (!) 140/80  Pulse: (!) 58  SpO2: 93%  Weight: 95.3 kg (210 lb)    PHYSICAL EXAM: General:  Well appearing. No resp difficulty HEENT: normal Neck: supple. no JVD. Carotids 2+ bilat; no bruits. No lymphadenopathy or thryomegaly appreciated. Cor: PMI nondisplaced. Regular rate & rhythm. No rubs, gallops or murmurs. Lungs: clear Abdomen: obese soft, nontender, nondistended. No hepatosplenomegaly. No bruits or masses. Good bowel sounds. Extremities: no cyanosis, clubbing, rash, edema Neuro: alert & orientedx3, cranial nerves grossly intact. moves all 4 extremities w/o difficulty. Affect pleasant  ASSESSMENT & PLAN:   1. Pulmonary HTN - VQ negative - RHC 05/23 as above - Suspect primarily WHO Group 2. Much improved with Jardiance & spiro - Need to watch salt and fluid intake - Volume management will likely be key  2. Chronic diastolic HF - based on echo, I suspect major issue here is diastolic HF - Volume  improve on Jardiance 10 and spiro 12.5  - Need to watch salt and fluid intake - Need to exclude amyloid. Check myeloma panel and cMRI  3. CAD - LHC 05/23 with 70% mRCA (med management recommended) - No s/s angina  4. Paroxysmal atrial fibrillation - In NSR on echo  - start Eliquis  5. Morbid obesity - encouraged weight loss - considering switching Tradjenta to [id number]  Glori Bickers, MD  12:57 PM

## 2022-05-20 NOTE — Patient Instructions (Signed)
Medication Changes:  STOP Aspirin  START Eliquis 5 mg Twice daily   Lab Work:  Labs done today, your results will be available in MyChart, we will contact you for abnormal readings.  Testing/Procedures:  Your physician has requested that you have a cardiac MRI. Cardiac MRI uses a computer to create images of your heart as its beating, producing both still and moving pictures of your heart and major blood vessels. For further information please visit [email or web address] Please follow the instruction sheet given to you today for more information. RADIOLOGY WILL CALL YOU TO SCHEDULE THIS  Referrals:  none  Special Instructions // Education:  Do the following things EVERYDAY: Weigh yourself in the morning before breakfast. Write it down and keep it in a log. Take your medicines as prescribed Eat low salt foods--Limit salt (sodium) to 2000 mg per day.  Stay as active as you can everyday Limit all fluids for the day to less than 2 liters   Follow-Up in: 3 months  At the [facility], you and your health needs are our priority. We have a designated team specialized in the treatment of Heart Failure. This Care Team includes your primary Heart Failure Specialized Cardiologist (physician), Advanced Practice Providers (APPs- Physician Assistants and Nurse Practitioners), and Pharmacist who all work together to provide you with the care you need, when you need it.   You may see any of the following providers on your designated Care Team at your next follow up:  Dr Glori Bickers Dr Haynes Kerns, NP Lyda Jester, [place] [facility] [place], [place] Audry Riles, PharmD   Please be sure to bring in all your medications bottles to every appointment.   Need to Contact us:  If you have any questions or concerns before your next appointment please send [place] a message through [place] or call our office at [phone].    TO LEAVE A MESSAGE FOR THE  NURSE SELECT OPTION 2, PLEASE LEAVE A MESSAGE INCLUDING: YOUR NAME DATE OF BIRTH CALL BACK NUMBER REASON FOR CALL**this is important as we prioritize the call backs  YOU WILL RECEIVE A CALL BACK THE SAME DAY AS LONG AS YOU CALL BEFORE 4:00 PM

## 2022-05-21 DIAGNOSIS — I5032 Chronic diastolic (congestive) heart failure: Secondary | ICD-10-CM | POA: Diagnosis not present

## 2022-05-21 DIAGNOSIS — I10 Essential (primary) hypertension: Secondary | ICD-10-CM | POA: Diagnosis not present

## 2022-05-21 DIAGNOSIS — Z23 Encounter for immunization: Secondary | ICD-10-CM | POA: Diagnosis not present

## 2022-05-21 DIAGNOSIS — G629 Polyneuropathy, unspecified: Secondary | ICD-10-CM | POA: Diagnosis not present

## 2022-05-21 DIAGNOSIS — Z0000 Encounter for general adult medical examination without abnormal findings: Secondary | ICD-10-CM | POA: Diagnosis not present

## 2022-05-21 DIAGNOSIS — Z79899 Other long term (current) drug therapy: Secondary | ICD-10-CM | POA: Diagnosis not present

## 2022-05-21 DIAGNOSIS — E118 Type 2 diabetes mellitus with unspecified complications: Secondary | ICD-10-CM | POA: Diagnosis not present

## 2022-05-21 DIAGNOSIS — E538 Deficiency of other specified B group vitamins: Secondary | ICD-10-CM | POA: Diagnosis not present

## 2022-05-21 DIAGNOSIS — E7800 Pure hypercholesterolemia, unspecified: Secondary | ICD-10-CM | POA: Diagnosis not present

## 2022-05-21 DIAGNOSIS — I272 Pulmonary hypertension, unspecified: Secondary | ICD-10-CM | POA: Diagnosis not present

## 2022-05-24 LAB — MULTIPLE MYELOMA PANEL, SERUM
Albumin SerPl Elph-Mcnc: 4.1 g/dL (ref 2.9–4.4)
Albumin/Glob SerPl: 1.1 (ref 0.7–1.7)
Alpha 1: 0.2 g/dL (ref 0.0–0.4)
Alpha2 Glob SerPl Elph-Mcnc: 1 g/dL (ref 0.4–1.0)
B-Globulin SerPl Elph-Mcnc: 1.4 g/dL — ABNORMAL HIGH (ref 0.7–1.3)
Gamma Glob SerPl Elph-Mcnc: 1.2 g/dL (ref 0.4–1.8)
Globulin, Total: 3.8 g/dL (ref 2.2–3.9)
IgA: 175 mg/dL (ref 64–422)
IgG (Immunoglobin G), Serum: 1178 mg/dL (ref 586–1602)
IgM (Immunoglobulin M), Srm: 89 mg/dL (ref 26–217)
Total Protein ELP: 7.9 g/dL (ref 6.0–8.5)

## 2022-05-25 ENCOUNTER — Ambulatory Visit (INDEPENDENT_AMBULATORY_CARE_PROVIDER_SITE_OTHER): Payer: Medicare Other | Admitting: Podiatry

## 2022-05-25 ENCOUNTER — Encounter: Payer: Self-pay | Admitting: Podiatry

## 2022-05-25 DIAGNOSIS — E119 Type 2 diabetes mellitus without complications: Secondary | ICD-10-CM

## 2022-05-25 DIAGNOSIS — Z860100 Personal history of colon polyps, unspecified: Secondary | ICD-10-CM | POA: Insufficient documentation

## 2022-05-25 DIAGNOSIS — G629 Polyneuropathy, unspecified: Secondary | ICD-10-CM | POA: Insufficient documentation

## 2022-05-25 DIAGNOSIS — E1142 Type 2 diabetes mellitus with diabetic polyneuropathy: Secondary | ICD-10-CM | POA: Diagnosis not present

## 2022-05-25 DIAGNOSIS — Z9981 Dependence on supplemental oxygen: Secondary | ICD-10-CM | POA: Insufficient documentation

## 2022-05-25 DIAGNOSIS — K644 Residual hemorrhoidal skin tags: Secondary | ICD-10-CM | POA: Insufficient documentation

## 2022-05-25 DIAGNOSIS — E7800 Pure hypercholesterolemia, unspecified: Secondary | ICD-10-CM | POA: Insufficient documentation

## 2022-05-25 DIAGNOSIS — N1832 Chronic kidney disease, stage 3b: Secondary | ICD-10-CM | POA: Insufficient documentation

## 2022-05-25 DIAGNOSIS — B351 Tinea unguium: Secondary | ICD-10-CM | POA: Diagnosis not present

## 2022-05-25 DIAGNOSIS — M79674 Pain in right toe(s): Secondary | ICD-10-CM | POA: Diagnosis not present

## 2022-05-25 DIAGNOSIS — I5032 Chronic diastolic (congestive) heart failure: Secondary | ICD-10-CM | POA: Insufficient documentation

## 2022-05-25 DIAGNOSIS — F418 Other specified anxiety disorders: Secondary | ICD-10-CM | POA: Insufficient documentation

## 2022-05-25 DIAGNOSIS — M79675 Pain in left toe(s): Secondary | ICD-10-CM

## 2022-05-30 NOTE — Progress Notes (Signed)
ANNUAL DIABETIC FOOT EXAM  Subjective: Deborah Weeks presents today for annual diabetic foot examination.  Patient confirms h/o diabetes.  Patient relates 10 year h/o diabetes.  Patient denies any h/o foot wounds.  Patient has been diagnosed with neuropathy and it is managed with gabapentin.  HgA1c was 6.3%  Patient did not check blood glucose this morning.  Risk factors: diabetes, HTN, COPD, CHF, CKD, hyperlipidemia, hypercholesterolemia, h/o tobacco use in remission.  Caren Macadam, MD is patient's PCP. Last visit was May 21, 2022.  Past Medical History:  Diagnosis Date   Arthritis    Diabetes mellitus without complication ([place])    High cholesterol    Hypertension    Lupus ([place])    Neuropathy    Patient Active Problem List   Diagnosis Date Noted   Anxiety about health 05/25/2022   Chronic diastolic heart failure ([place]) 05/25/2022   Chronic kidney disease, stage 3b ([place]) 05/25/2022   External hemorrhoids 05/25/2022   Neuropathy 05/25/2022   Oxygen dependent 05/25/2022   Personal history of colonic polyps 05/25/2022   Pure hypercholesterolemia 05/25/2022   Other secondary pulmonary hypertension ([place]) 01/15/2022   COPD (chronic obstructive pulmonary disease) ([place]) 01/15/2022   Dyspnea 12/29/2021   HLD (hyperlipidemia)    Abnormal urinalysis 10/15/2018   Hypokalemia 10/15/2018   Chest pain 10/15/2018   Chronic respiratory failure with hypoxia ([place]) 10/14/2018   Recurrent major depressive disorder, in full remission ([place]) 10/28/2014   Multinodular goiter 08/30/2012   Allergic rhinitis 05/30/2012   DM (diabetes mellitus) ([place]) 01/05/2011   Controlled type 2 diabetes mellitus without complication ([place]) 62/95/2841   Hypertension 01/04/2011   Osteopenia 12/07/2010   Glaucoma 02/27/2010   History of neoplasm of bladder 02/27/2010   Systemic lupus erythematosus ([place]) 02/27/2010   Past Surgical History:  Procedure Laterality Date   BACK SURGERY     LAPAROSCOPIC  TUBAL LIGATION     RIGHT/LEFT HEART CATH AND CORONARY ANGIOGRAPHY N/A 02/24/2022   Procedure: RIGHT/LEFT HEART CATH AND CORONARY ANGIOGRAPHY;  Surgeon: Jolaine Artist, MD;  Location: [facility];  Service: Cardiovascular;  Laterality: N/A;   Current Outpatient Medications on File Prior to Visit  Medication Sig Dispense Refill   acetaminophen (TYLENOL) 500 MG tablet Take 1,000 mg by mouth every 8 (eight) hours as needed.     amLODipine (NORVASC) 5 MG tablet Take 5 mg by mouth daily.     apixaban (ELIQUIS) 5 MG TABS tablet Take 1 tablet (5 mg total) by mouth 2 (two) times daily. 60 tablet 6   Blood Glucose Monitoring Suppl ([place]) w/Device KIT 2 (two) times daily.     carvedilol (COREG) 25 MG tablet Take 25 mg by mouth 2 (two) times daily.      Cholecalciferol (VITAMIN D3 PO) Take 1,000 mg by mouth daily in the afternoon.     Cyanocobalamin (VITAMIN B12) 1000 MCG TBCR Take 1,000 mcg by mouth daily.     empagliflozin (JARDIANCE) 10 MG TABS tablet Take 1 tablet (10 mg total) by mouth daily before breakfast. 30 tablet 6   fenofibrate (TRICOR) 145 MG tablet Take 145 mg by mouth daily.     furosemide (LASIX) 20 MG tablet Take 1 tablet (20 mg total) by mouth daily. 30 tablet 11   hydrALAZINE (APRESOLINE) 25 MG tablet Take 25 mg by mouth 2 (two) times daily.     Lancets (ONETOUCH DELICA PLUS [id number]) MISC 3 (three) times daily.     linagliptin (TRADJENTA) 5 MG TABS  tablet Take 1 tablet (5 mg total) by mouth daily. 30 tablet 1   ondansetron (ZOFRAN-ODT) 4 MG disintegrating tablet Take 1 tablet (4 mg total) by mouth every 8 (eight) hours as needed for nausea or vomiting. 20 tablet 0   ONETOUCH VERIO test strip 3 (three) times daily.     pregabalin (LYRICA) 75 MG capsule Take 75 mg by mouth 2 (two) times daily.     pregabalin (LYRICA) 75 MG capsule Take 100 mg by mouth at bedtime.     spironolactone (ALDACTONE) 25 MG tablet Take 0.5 tablets (12.5 mg total) by mouth daily.  15 tablet 3   Current Facility-Administered Medications on File Prior to Visit  Medication Dose Route Frequency Provider Last Rate Last Admin   triamcinolone acetonide (KENALOG) 10 MG/ML injection 10 mg  10 mg Other Once Landis Martins, DPM        Allergies  Allergen Reactions   Lexapro [Escitalopram Oxalate] Other (See Comments)    Insomnia, but patient currently takes this   Lipitor [Atorvastatin] Other (See Comments)    Cramps    Escitalopram     Other reaction(s): keeps her up at night   Iodinated Contrast Media Nausea And Vomiting   Xanax [Alprazolam] Other (See Comments)    "Felt like I had a hangover", HA   Codeine Nausea And Vomiting    Other reaction(s): vomiting   Neurontin [Gabapentin] Other (See Comments)    Makes pt shake    Niacin And Related Itching and Rash   Spironolactone Other (See Comments)    hyperkalemia   Social History   Occupational History   Not on file  Tobacco Use   Smoking status: Former    Packs/day: 1.00    Years: 40.00    Total pack years: 40.00    Types: Cigarettes    Quit date: 10/11/2016    Years since quitting: 5.6   Smokeless tobacco: Never  Vaping Use   Vaping Use: Never used  Substance and Sexual Activity   Alcohol use: Never    Alcohol/week: 0.0 standard drinks of alcohol   Drug use: Never   Sexual activity: Not on file   Family History  Problem Relation Age of Onset   Diabetes Mother    Hypertension Mother    Diabetes Father    Hypertension Father    Breast cancer Cousin    Alpha-1 antitrypsin deficiency Neg Hx    Lupus Neg Hx    Neurofibromatosis Neg Hx    Coronary artery disease Neg Hx    Immunization History  Administered Date(s) Administered   Influenza Split 08/19/2010   Influenza, High Dose Seasonal PF 08/30/2012, 07/24/2014, 06/16/2021   Influenza,inj,Quad PF,6+ Mos 09/14/2017   Influenza-Unspecified 06/22/2013   PFIZER(Purple Top)SARS-COV-2 Vaccination 06/12/2020, 07/03/2020, 12/31/2020   Pneumococcal  Conjugate-13 09/10/2010, 12/14/2017   Pneumococcal Polysaccharide-23 09/10/2010, 10/31/2015   Tdap 10/11/2002, 05/13/2021   Zoster, Live 09/28/2010     Review of Systems: Negative except as noted in the HPI.   Objective: There were no vitals filed for this visit.  Deborah Weeks is a pleasant 76 y.o. female in NAD. AAO X 3.  Objective:   Vascular Examination: Vascular status intact b/l with palpable DP pedal pulses; faintly palpable PT pulses b/l. Pedal hair present b/l. CFT immediate b/l. No pain with calf compression b/l. Skin temperature gradient WNL b/l. Trace edema noted BLE. No cyanosis or clubbing noted b/l LE.  Neurological Examination: Sensation grossly intact b/l with 10 gram monofilament. Vibratory sensation  intact b/l.   Dermatological Examination: Pedal skin with normal turgor, texture and tone b/l. Toenails 1-5 b/l thick, discolored, elongated with subungual debris and pain on dorsal palpation. No hyperkeratotic lesions noted b/l.   Musculoskeletal Examination: Muscle strength 5/5 to b/l LE. No pain, crepitus or joint limitation noted with ROM bilateral LE. No gross bony deformities bilaterally.  Radiographs: None  Footwear Assessment: Does the patient wear appropriate shoes? Yes. Does the patient need inserts/orthotics? No.  ADA Risk Categorization: Low Risk :  Patient has all of the following: Intact protective sensation No prior foot ulcer  No severe deformity Pedal pulses present  Assessment: 1. Pain due to onychomycosis of toenails of both feet   2. Diabetic polyneuropathy associated with type 2 diabetes mellitus ([place])   3. Encounter for diabetic foot exam [facility])     Plan: -Patient was evaluated and treated. All patient's and/or POA's questions/concerns answered on today's visit. -Diabetic foot examination performed today. -Stressed the importance of good glycemic control and the detriment of not  controlling glucose levels in relation to the  foot. -Patient to continue soft, supportive shoe gear daily. -Mycotic toenails 1-5 bilaterally were debrided in length and girth with sterile nail nippers and dremel without incident. -Patient/POA to call should there be question/concern in the interim. Return in about 3 months (around 08/25/2022).  Marzetta Board, DPM

## 2022-06-09 DIAGNOSIS — E559 Vitamin D deficiency, unspecified: Secondary | ICD-10-CM | POA: Diagnosis not present

## 2022-06-09 DIAGNOSIS — N200 Calculus of kidney: Secondary | ICD-10-CM | POA: Diagnosis not present

## 2022-06-09 DIAGNOSIS — I129 Hypertensive chronic kidney disease with stage 1 through stage 4 chronic kidney disease, or unspecified chronic kidney disease: Secondary | ICD-10-CM | POA: Diagnosis not present

## 2022-06-09 DIAGNOSIS — E1122 Type 2 diabetes mellitus with diabetic chronic kidney disease: Secondary | ICD-10-CM | POA: Diagnosis not present

## 2022-06-09 DIAGNOSIS — N184 Chronic kidney disease, stage 4 (severe): Secondary | ICD-10-CM | POA: Diagnosis not present

## 2022-06-09 DIAGNOSIS — E875 Hyperkalemia: Secondary | ICD-10-CM | POA: Diagnosis not present

## 2022-06-09 DIAGNOSIS — R809 Proteinuria, unspecified: Secondary | ICD-10-CM | POA: Diagnosis not present

## 2022-06-15 DIAGNOSIS — J449 Chronic obstructive pulmonary disease, unspecified: Secondary | ICD-10-CM | POA: Diagnosis not present

## 2022-06-17 ENCOUNTER — Other Ambulatory Visit (HOSPITAL_COMMUNITY): Payer: Self-pay | Admitting: Internal Medicine

## 2022-06-29 DIAGNOSIS — I10 Essential (primary) hypertension: Secondary | ICD-10-CM | POA: Diagnosis not present

## 2022-06-29 DIAGNOSIS — E7800 Pure hypercholesterolemia, unspecified: Secondary | ICD-10-CM | POA: Diagnosis not present

## 2022-06-29 DIAGNOSIS — N1832 Chronic kidney disease, stage 3b: Secondary | ICD-10-CM | POA: Diagnosis not present

## 2022-06-29 DIAGNOSIS — I5032 Chronic diastolic (congestive) heart failure: Secondary | ICD-10-CM | POA: Diagnosis not present

## 2022-06-29 DIAGNOSIS — E118 Type 2 diabetes mellitus with unspecified complications: Secondary | ICD-10-CM | POA: Diagnosis not present

## 2022-07-15 DIAGNOSIS — J449 Chronic obstructive pulmonary disease, unspecified: Secondary | ICD-10-CM | POA: Diagnosis not present

## 2022-07-20 ENCOUNTER — Other Ambulatory Visit: Payer: Self-pay | Admitting: Family Medicine

## 2022-07-20 DIAGNOSIS — Z1231 Encounter for screening mammogram for malignant neoplasm of breast: Secondary | ICD-10-CM

## 2022-07-29 DIAGNOSIS — E7800 Pure hypercholesterolemia, unspecified: Secondary | ICD-10-CM | POA: Diagnosis not present

## 2022-07-29 DIAGNOSIS — I10 Essential (primary) hypertension: Secondary | ICD-10-CM | POA: Diagnosis not present

## 2022-07-29 DIAGNOSIS — E118 Type 2 diabetes mellitus with unspecified complications: Secondary | ICD-10-CM | POA: Diagnosis not present

## 2022-07-29 DIAGNOSIS — N1832 Chronic kidney disease, stage 3b: Secondary | ICD-10-CM | POA: Diagnosis not present

## 2022-07-29 DIAGNOSIS — I5032 Chronic diastolic (congestive) heart failure: Secondary | ICD-10-CM | POA: Diagnosis not present

## 2022-08-06 ENCOUNTER — Telehealth (HOSPITAL_COMMUNITY): Payer: Self-pay | Admitting: Emergency Medicine

## 2022-08-06 NOTE — Telephone Encounter (Signed)
Reaching out to patient to offer assistance regarding upcoming cardiac imaging study; pt verbalizes understanding of appt date/time, parking situation and where to check in, pre-test NPO status and medications ordered, and verified current allergies; name and call back number provided for further questions should they arise Marchia Bond RN Navigator Cardiac Imaging [facility] [phone] office [phone] cell  Denies iv issies Denies claustro Denies metal

## 2022-08-09 ENCOUNTER — Other Ambulatory Visit (HOSPITAL_COMMUNITY): Payer: Self-pay | Admitting: Internal Medicine

## 2022-08-09 ENCOUNTER — Ambulatory Visit (HOSPITAL_COMMUNITY)
Admission: RE | Admit: 2022-08-09 | Discharge: 2022-08-09 | Disposition: A | Discharge status: Home / Self Care | Payer: Medicare Other | Source: Ambulatory Visit | Attending: Internal Medicine | Admitting: Internal Medicine

## 2022-08-09 DIAGNOSIS — I517 Cardiomegaly: Secondary | ICD-10-CM | POA: Insufficient documentation

## 2022-08-09 MED ORDER — GADOBUTROL 1 MMOL/ML IV SOLN
12.0000 mL | Freq: Once | INTRAVENOUS | Status: AC | PRN
  Administered 2022-08-09: 12 mL via INTRAVENOUS

## 2022-08-11 ENCOUNTER — Ambulatory Visit (HOSPITAL_COMMUNITY)
Admission: RE | Admit: 2022-08-11 | Discharge: 2022-08-11 | Disposition: A | Discharge status: Home / Self Care | Payer: Medicare Other | Source: Ambulatory Visit | Attending: Internal Medicine | Admitting: Internal Medicine

## 2022-08-11 ENCOUNTER — Encounter (HOSPITAL_COMMUNITY): Payer: Self-pay | Admitting: Internal Medicine

## 2022-08-11 VITALS — BP 108/68 | HR 64 | Temp 96.3°F | Wt 213.2 lb

## 2022-08-11 DIAGNOSIS — I110 Hypertensive heart disease with heart failure: Secondary | ICD-10-CM | POA: Diagnosis not present

## 2022-08-11 DIAGNOSIS — I5032 Chronic diastolic (congestive) heart failure: Secondary | ICD-10-CM | POA: Diagnosis not present

## 2022-08-11 DIAGNOSIS — I272 Pulmonary hypertension, unspecified: Secondary | ICD-10-CM | POA: Diagnosis not present

## 2022-08-11 DIAGNOSIS — Z79899 Other long term (current) drug therapy: Secondary | ICD-10-CM | POA: Insufficient documentation

## 2022-08-11 DIAGNOSIS — Z87891 Personal history of nicotine dependence: Secondary | ICD-10-CM | POA: Diagnosis not present

## 2022-08-11 DIAGNOSIS — E1140 Type 2 diabetes mellitus with diabetic neuropathy, unspecified: Secondary | ICD-10-CM | POA: Insufficient documentation

## 2022-08-11 DIAGNOSIS — Z8616 Personal history of COVID-19: Secondary | ICD-10-CM | POA: Diagnosis not present

## 2022-08-11 DIAGNOSIS — Z7984 Long term (current) use of oral hypoglycemic drugs: Secondary | ICD-10-CM | POA: Diagnosis not present

## 2022-08-11 DIAGNOSIS — I480 Paroxysmal atrial fibrillation: Secondary | ICD-10-CM | POA: Insufficient documentation

## 2022-08-11 DIAGNOSIS — I5033 Acute on chronic diastolic (congestive) heart failure: Secondary | ICD-10-CM | POA: Insufficient documentation

## 2022-08-11 DIAGNOSIS — M329 Systemic lupus erythematosus, unspecified: Secondary | ICD-10-CM | POA: Diagnosis not present

## 2022-08-11 DIAGNOSIS — Z7901 Long term (current) use of anticoagulants: Secondary | ICD-10-CM | POA: Insufficient documentation

## 2022-08-11 DIAGNOSIS — I2510 Atherosclerotic heart disease of native coronary artery without angina pectoris: Secondary | ICD-10-CM | POA: Diagnosis not present

## 2022-08-11 MED ORDER — CARVEDILOL 12.5 MG PO TABS: 12.5000 mg | ORAL_TABLET | Freq: Two times a day (BID) | ORAL | 11 refills | Status: DC

## 2022-08-11 NOTE — Progress Notes (Signed)
[facility]  NOTE  Referring Physician:Kathryn Grandville Silos, PA-C Primary Care:  Caren Macadam, MD Primary Cardiologist: Candee Furbish  HPI:  Deborah Weeks is a 76 y.o. female with a hx of morbid obesity, HTN, DM2 with neuropathy, lupus, chronic diastolic CHF, previous tobacco abuse, PAF and severe pulmonary hypertension by echo 3/23 who is referred by Nell Range, PA-C for further evaluation of dyspnea and PH.    Admitted 10/2018 with sepsis/acute respiratory failure with hypoxia secondary to PNA and a/c diastolic HF with PAF. Echo EF 60-65% intdeterminate DD.  She was not started on anticoagulation due to an episode of hemoptysis during this admission.     She wore a heart monitor after this admission which showed no atrial fibrillation.  Since she had an isolated episode of A-fib in the setting of acute illness with no evidence for recurrence, it was decided to defer long-term oral anticoagulation.   Seen 3/23 by Nell Range, PA-C. For worsening dyspnea. Follow up CXR showed pulmonary infiltrates with effusions and labs with creat 1.58 (likely baseline- first lab since 09/2019), nt pro bnp 882. Lasix restarted. Echo ordered referred to Dr. Lamonte Sakai and [place].    She saw Dr. Brock Ra on 12/29/21 who asked her to continue use 2 L/min until we have her volume optimized. He felt that she was at risk for interstitial lung disease given her history of SLE and the COVID-19 in 2021.    2D echo 01/12/22 showed EF 65-70%, severe LAE, with restrictive filling pattern E/E" 29.2 mildly enlarged RV, severe pulm HTN 74.6 mm hg and mild to mod MR.    She was seen back by Dr. Lamonte Sakai on 01/15/2022 who felt her pulmonary hypertension was multifactorial.  He ordered a nocturnal oximetry, PFTs and a VQ scan   VQ scan 01/28/2022: normal PFT 01/29/22: FEV1 1/29 (66%) FVC 1.62 (64% DLCO 62%  R/LHC 02/24/22:  1 v CAD with 70% mRCA (med management recommended)  Findings:   Ao = 136/56 (90) LV =  140/16 RA = 2 RV = 59/6 PA = 58/8 (38) PCW = 15 (v = 26) Fick cardiac output/index = 6.8/3.3 PVR = 3.4 WU SVR = 1,034 FA sat = 91% PA sat = 66%, 67%   Echo 05/20/22 EF 65-70%, moderate LVH, grade III DD, RV okay, TR signal inadequate to assess PA pressure, sever LAE. No snoring.   cMRI 08/09/22 EF 64% RV normal No LGE.   Here for f/u. Says she hasn't felt well for past few days due to URI symptoms. This am was 102.4. Volume status ok. No CP or SOB.  Edema well controlled.    Past Medical History:  Diagnosis Date   Arthritis    Diabetes mellitus without complication (HCC)    High cholesterol    Hypertension    Lupus (HCC)    Neuropathy     Current Outpatient Medications  Medication Sig Dispense Refill   acetaminophen (TYLENOL) 500 MG tablet Take 1,000 mg by mouth every 8 (eight) hours as needed.     amLODipine (NORVASC) 5 MG tablet Take 5 mg by mouth daily.     apixaban (ELIQUIS) 5 MG TABS tablet Take 1 tablet (5 mg total) by mouth 2 (two) times daily. 60 tablet 6   Blood Glucose Monitoring Suppl ([place]) w/Device KIT 2 (two) times daily.     carvedilol (COREG) 25 MG tablet Take 25 mg by mouth 2 (two) times daily.      Cholecalciferol (VITAMIN  D3 PO) Take 1,000 mg by mouth daily in the afternoon.     Cyanocobalamin (VITAMIN B12) 1000 MCG TBCR Take 1,000 mcg by mouth daily.     furosemide (LASIX) 20 MG tablet Take 1 tablet (20 mg total) by mouth daily. 30 tablet 11   GLYXAMBI 10-5 MG TABS Take 1 tablet by mouth every morning.     hydrALAZINE (APRESOLINE) 25 MG tablet Take 25 mg by mouth 2 (two) times daily.     Lancets (ONETOUCH DELICA PLUS [id number]) MISC 3 (three) times daily.     ondansetron (ZOFRAN-ODT) 4 MG disintegrating tablet Take 1 tablet (4 mg total) by mouth every 8 (eight) hours as needed for nausea or vomiting. 20 tablet 0   ONETOUCH VERIO test strip 3 (three) times daily.     pregabalin (LYRICA) 75 MG capsule Take 75 mg by mouth 2 (two) times  daily.     pregabalin (LYRICA) 75 MG capsule Take 100 mg by mouth at bedtime.     spironolactone (ALDACTONE) 25 MG tablet Take 0.5 tablets (12.5 mg total) by mouth daily. 15 tablet 3   Current Facility-Administered Medications  Medication Dose Route Frequency Provider Last Rate Last Admin   triamcinolone acetonide (KENALOG) 10 MG/ML injection 10 mg  10 mg Other Once Landis Martins, DPM        Allergies  Allergen Reactions   Lexapro [Escitalopram Oxalate] Other (See Comments)    Insomnia, but patient currently takes this   Lipitor [Atorvastatin] Other (See Comments)    Cramps    Escitalopram     Other reaction(s): keeps her up at night   Iodinated Contrast Media Nausea And Vomiting   Xanax [Alprazolam] Other (See Comments)    "Felt like I had a hangover", HA   Codeine Nausea And Vomiting    Other reaction(s): vomiting   Neurontin [Gabapentin] Other (See Comments)    Makes pt shake    Niacin And Related Itching and Rash   Spironolactone Other (See Comments)    hyperkalemia      Social History   Socioeconomic History   Marital status: Single    Spouse name: Not on file   Number of children: Not on file   Years of education: Not on file   Highest education level: Not on file  Occupational History   Not on file  Tobacco Use   Smoking status: Former    Packs/day: 1.00    Years: 40.00    Total pack years: 40.00    Types: Cigarettes    Quit date: 10/11/2016    Years since quitting: 5.8   Smokeless tobacco: Never  Vaping Use   Vaping Use: Never used  Substance and Sexual Activity   Alcohol use: Never    Alcohol/week: 0.0 standard drinks of alcohol   Drug use: Never   Sexual activity: Not on file  Other Topics Concern   Not on file  Social History Narrative   Not on file   Social Determinants of Health   Financial Resource Strain: Not on file  Food Insecurity: Not on file  Transportation Needs: Not on file  Physical Activity: Not on file  Stress: Not on file   Social Connections: Not on file  Intimate Partner Violence: Not on file      Family History  Problem Relation Age of Onset   Diabetes Mother    Hypertension Mother    Diabetes Father    Hypertension Father    Breast cancer Cousin  Alpha-1 antitrypsin deficiency Neg Hx    Lupus Neg Hx    Neurofibromatosis Neg Hx    Coronary artery disease Neg Hx     Vitals:   08/11/22 0957  BP: 108/68  Pulse: 64  SpO2: 94%  Weight: 96.7 kg (213 lb 3.2 oz)     PHYSICAL EXAM: General:  Well appearing. No resp difficulty HEENT: normal Neck: supple. no JVD. Carotids 2+ bilat; no bruits. No lymphadenopathy or thryomegaly appreciated. Cor: PMI nondisplaced. Regular rate & rhythm. No rubs, gallops or murmurs. Lungs: clear Abdomen: obese soft, nontender, nondistended. No hepatosplenomegaly. No bruits or masses. Good bowel sounds. Extremities: no cyanosis, clubbing, rash, edema Neuro: alert & orientedx3, cranial nerves grossly intact. moves all 4 extremities w/o difficulty. Affect pleasant  ASSESSMENT & PLAN:   1. Pulmonary HTN - VQ negative - RHC 05/23 as above - Suspect primarily WHO Group 2. Much improved with Jardiance & spiro - Need to watch salt and fluid intake - Continue aggressive volume management   2. Chronic diastolic HF - based on echo, I suspect major issue here is diastolic HF - Echo 01/23/82 EF 65-70%, moderate LVH, grade III DD, RV okay, TR signal inadequate to assess PA pressure, sever LAE. No snoring.  - cMRI 08/09/22 EF 64% RV normal No LGE. No amyloid  - Volume improve on Jardiance 10 and spiro 12.5  - Decrease carvedilol to 12.5 bid   3. CAD - LHC 05/23 with 70% mRCA (med management recommended) - No s/s angina  4. Paroxysmal atrial fibrillation - In NSR today - start Eliquis  5. Morbid obesity - encouraged weight loss - considering switching Tradjenta to [id number]  Glori Bickers, MD  10:00 AM

## 2022-08-11 NOTE — Addendum Note (Signed)
Encounter addended by: Scarlette Calico, RN on: 08/11/2022 10:19 AM  Actions taken: Order list changed, Clinical Note Signed

## 2022-08-11 NOTE — Patient Instructions (Signed)
Decrease Carvedilol to 12.5 mg Twice daily   Your physician recommends that you schedule a follow-up appointment in: August 2024, **PLEASE CALL OUR OFFICE IN [place] TO SCHEDULE THIS APPOINTMENT  If you have any questions or concerns before your next appointment please send [place] a message through [place] or call our office at [phone].    TO LEAVE A MESSAGE FOR THE NURSE SELECT OPTION 2, PLEASE LEAVE A MESSAGE INCLUDING: YOUR NAME DATE OF BIRTH CALL BACK NUMBER REASON FOR CALL**this is important as we prioritize the call backs  YOU WILL RECEIVE A CALL BACK THE SAME DAY AS LONG AS YOU CALL BEFORE 4:00 PM  At the [facility], you and your health needs are our priority. As part of our continuing mission to provide you with exceptional heart care, we have created designated Provider Care Teams. These Care Teams include your primary Cardiologist (physician) and Advanced Practice Providers (APPs- Physician Assistants and Nurse Practitioners) who all work together to provide you with the care you need, when you need it.   You may see any of the following providers on your designated Care Team at your next follow up: Dr Glori Bickers Dr Loralie Champagne Dr. Roxana Hires, NP Lyda Jester, [place] [facility] [place], [place] Forestine Na, NP Audry Riles, PharmD   Please be sure to bring in all your medications bottles to every appointment.

## 2022-08-15 DIAGNOSIS — J449 Chronic obstructive pulmonary disease, unspecified: Secondary | ICD-10-CM | POA: Diagnosis not present

## 2022-08-20 ENCOUNTER — Ambulatory Visit
Admission: RE | Admit: 2022-08-20 | Discharge: 2022-08-20 | Disposition: A | Discharge status: Home / Self Care | Payer: Medicare Other | Source: Ambulatory Visit | Attending: Family Medicine | Admitting: Family Medicine

## 2022-08-20 DIAGNOSIS — I10 Essential (primary) hypertension: Secondary | ICD-10-CM | POA: Diagnosis not present

## 2022-08-20 DIAGNOSIS — E118 Type 2 diabetes mellitus with unspecified complications: Secondary | ICD-10-CM | POA: Diagnosis not present

## 2022-08-20 DIAGNOSIS — G720 Drug-induced myopathy: Secondary | ICD-10-CM | POA: Diagnosis not present

## 2022-08-20 DIAGNOSIS — Z1231 Encounter for screening mammogram for malignant neoplasm of breast: Secondary | ICD-10-CM | POA: Diagnosis not present

## 2022-08-20 DIAGNOSIS — T466X5A Adverse effect of antihyperlipidemic and antiarteriosclerotic drugs, initial encounter: Secondary | ICD-10-CM | POA: Diagnosis not present

## 2022-08-20 DIAGNOSIS — E7800 Pure hypercholesterolemia, unspecified: Secondary | ICD-10-CM | POA: Diagnosis not present

## 2022-08-20 DIAGNOSIS — I5032 Chronic diastolic (congestive) heart failure: Secondary | ICD-10-CM | POA: Diagnosis not present

## 2022-08-24 DIAGNOSIS — Z23 Encounter for immunization: Secondary | ICD-10-CM | POA: Diagnosis not present

## 2022-09-07 ENCOUNTER — Encounter: Payer: Self-pay | Admitting: Podiatry

## 2022-09-07 ENCOUNTER — Ambulatory Visit (INDEPENDENT_AMBULATORY_CARE_PROVIDER_SITE_OTHER): Payer: Medicare Other | Admitting: Podiatry

## 2022-09-07 DIAGNOSIS — M79675 Pain in left toe(s): Secondary | ICD-10-CM | POA: Diagnosis not present

## 2022-09-07 DIAGNOSIS — E1142 Type 2 diabetes mellitus with diabetic polyneuropathy: Secondary | ICD-10-CM

## 2022-09-07 DIAGNOSIS — M79674 Pain in right toe(s): Secondary | ICD-10-CM | POA: Diagnosis not present

## 2022-09-07 DIAGNOSIS — L84 Corns and callosities: Secondary | ICD-10-CM | POA: Diagnosis not present

## 2022-09-07 DIAGNOSIS — B351 Tinea unguium: Secondary | ICD-10-CM

## 2022-09-11 NOTE — Progress Notes (Signed)
  Subjective:  Patient ID: Deborah Weeks, female    DOB: 1946-03-20,  MRN: [id number]  Deborah Weeks presents to clinic today for at risk foot care with history of diabetic neuropathy and painful elongated mycotic toenails 1-5 bilaterally which are tender when wearing enclosed shoe gear. Pain is relieved with periodic professional debridement.  Chief Complaint  Patient presents with   Nail Problem    Diabetic foot care BS-did not check today A1C-7.0 PCP-[facility] PCP VST- 2 Weeks ago   New problem(s): None.   PCP is Deborah Macadam, MD.  Allergies  Allergen Reactions   Lexapro [Escitalopram Oxalate] Other (See Comments)    Insomnia, but patient currently takes this   Lipitor [Atorvastatin] Other (See Comments)    Cramps    Escitalopram     Other reaction(s): keeps her up at night   Iodinated Contrast Media Nausea And Vomiting   Xanax [Alprazolam] Other (See Comments)    "Felt like I had a hangover", HA   Codeine Nausea And Vomiting    Other reaction(s): vomiting   Neurontin [Gabapentin] Other (See Comments)    Makes pt shake    Niacin And Related Itching and Rash   Spironolactone Other (See Comments)    hyperkalemia    Review of Systems: Negative except as noted in the HPI.  Objective:   Deborah Weeks is a pleasant 76 y.o. female in NAD. AAO x 3.  Vascular Examination: Vascular status intact b/l with palpable DP pedal pulses; faintly palpable PT pulses b/l. Pedal hair present b/l. CFT immediate b/l. No pain with calf compression b/l. Skin temperature gradient WNL b/l. Trace edema noted BLE. No cyanosis or clubbing noted b/l LE.  Neurological Examination: Sensation grossly intact b/l with 10 gram monofilament. Vibratory sensation intact b/l.   Dermatological Examination: Pedal skin with normal turgor, texture and tone b/l. Toenails 1-5 b/l thick, discolored, elongated with subungual debris and pain on dorsal palpation.   Hyperkeratotic lesion(s) ball of both feet.   No erythema, no edema, no drainage, no fluctuance.  Musculoskeletal Examination: Muscle strength 5/5 to b/l LE. No pain, crepitus or joint limitation noted with ROM bilateral LE. No gross bony deformities bilaterally.  Radiographs: None  Assessment/Plan: 1. Pain due to onychomycosis of toenails of both feet   2. Callus   3. Diabetic polyneuropathy associated with type 2 diabetes mellitus ([place])     No orders of the defined types were placed in this encounter.   -Consent given for treatment as described below: -Examined patient. -Continue diabetic foot care principles: inspect feet daily, monitor glucose as recommended by PCP and/or Endocrinologist, and follow prescribed diet per PCP, Endocrinologist and/or dietician. -Continue supportive shoe gear daily. -Toenails 1-5 b/l were debrided in length and girth with sterile nail nippers and dremel without iatrogenic bleeding.  -Callus(es) ball of both feet pared utilizing sterile scalpel blade without complication or incident. Total number debrided =2. -Patient/POA to call should there be question/concern in the interim.   Return in about 3 months (around 12/08/2022).  Marzetta Board, DPM

## 2022-09-14 DIAGNOSIS — J449 Chronic obstructive pulmonary disease, unspecified: Secondary | ICD-10-CM | POA: Diagnosis not present

## 2022-10-15 DIAGNOSIS — J449 Chronic obstructive pulmonary disease, unspecified: Secondary | ICD-10-CM | POA: Diagnosis not present

## 2022-10-18 ENCOUNTER — Other Ambulatory Visit: Payer: Self-pay | Admitting: Physician Assistant

## 2022-10-18 DIAGNOSIS — I5032 Chronic diastolic (congestive) heart failure: Secondary | ICD-10-CM | POA: Diagnosis not present

## 2022-10-18 DIAGNOSIS — E118 Type 2 diabetes mellitus with unspecified complications: Secondary | ICD-10-CM | POA: Diagnosis not present

## 2022-10-18 DIAGNOSIS — E7800 Pure hypercholesterolemia, unspecified: Secondary | ICD-10-CM | POA: Diagnosis not present

## 2022-10-18 DIAGNOSIS — N1832 Chronic kidney disease, stage 3b: Secondary | ICD-10-CM | POA: Diagnosis not present

## 2022-10-18 DIAGNOSIS — I10 Essential (primary) hypertension: Secondary | ICD-10-CM | POA: Diagnosis not present

## 2022-11-03 DIAGNOSIS — R051 Acute cough: Secondary | ICD-10-CM | POA: Diagnosis not present

## 2022-11-03 DIAGNOSIS — J208 Acute bronchitis due to other specified organisms: Secondary | ICD-10-CM | POA: Diagnosis not present

## 2022-11-15 DIAGNOSIS — L720 Epidermal cyst: Secondary | ICD-10-CM | POA: Diagnosis not present

## 2022-11-15 DIAGNOSIS — J449 Chronic obstructive pulmonary disease, unspecified: Secondary | ICD-10-CM | POA: Diagnosis not present

## 2022-11-15 DIAGNOSIS — D2239 Melanocytic nevi of other parts of face: Secondary | ICD-10-CM | POA: Diagnosis not present

## 2022-11-23 DIAGNOSIS — I10 Essential (primary) hypertension: Secondary | ICD-10-CM | POA: Diagnosis not present

## 2022-11-23 DIAGNOSIS — I480 Paroxysmal atrial fibrillation: Secondary | ICD-10-CM | POA: Diagnosis not present

## 2022-11-23 DIAGNOSIS — G629 Polyneuropathy, unspecified: Secondary | ICD-10-CM | POA: Diagnosis not present

## 2022-11-23 DIAGNOSIS — E118 Type 2 diabetes mellitus with unspecified complications: Secondary | ICD-10-CM | POA: Diagnosis not present

## 2022-11-23 DIAGNOSIS — I272 Pulmonary hypertension, unspecified: Secondary | ICD-10-CM | POA: Diagnosis not present

## 2022-11-23 DIAGNOSIS — I5032 Chronic diastolic (congestive) heart failure: Secondary | ICD-10-CM | POA: Diagnosis not present

## 2022-11-23 DIAGNOSIS — Z9981 Dependence on supplemental oxygen: Secondary | ICD-10-CM | POA: Diagnosis not present

## 2022-11-23 DIAGNOSIS — E7800 Pure hypercholesterolemia, unspecified: Secondary | ICD-10-CM | POA: Diagnosis not present

## 2022-12-14 DIAGNOSIS — J449 Chronic obstructive pulmonary disease, unspecified: Secondary | ICD-10-CM | POA: Diagnosis not present

## 2022-12-14 IMAGING — MG MM DIGITAL SCREENING BILAT W/ TOMO AND CAD
6 of 10 series · 6 of 30 positions shown · non-contrast
Comparison: Previous exam(s).

CLINICAL DATA: Screening.

EXAM:
DIGITAL SCREENING BILATERAL MAMMOGRAM WITH TOMOSYNTHESIS AND CAD
TECHNIQUE: Bilateral screening digital craniocaudal and mediolateral oblique
mammograms were obtained. Bilateral screening digital breast
tomosynthesis was performed. The images were evaluated with
computer-aided detection.

[L MLO synth-2D (1 of 2)]
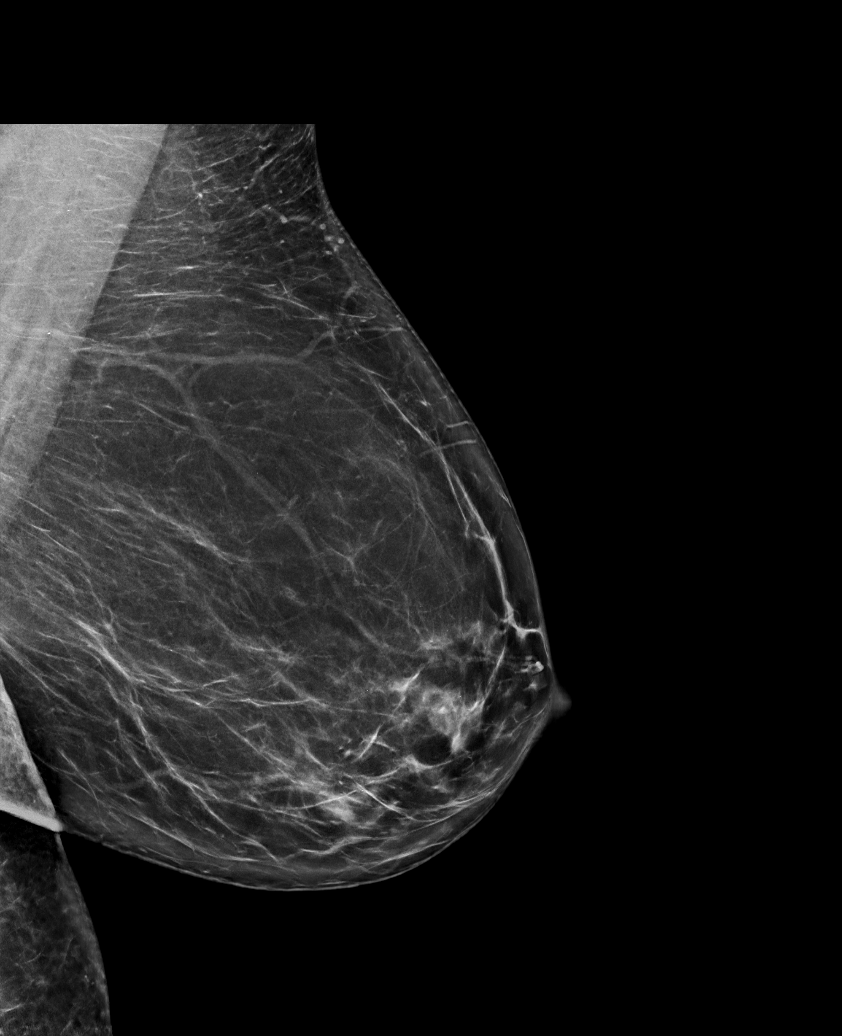

[L MLO synth-2D (2 of 2)]
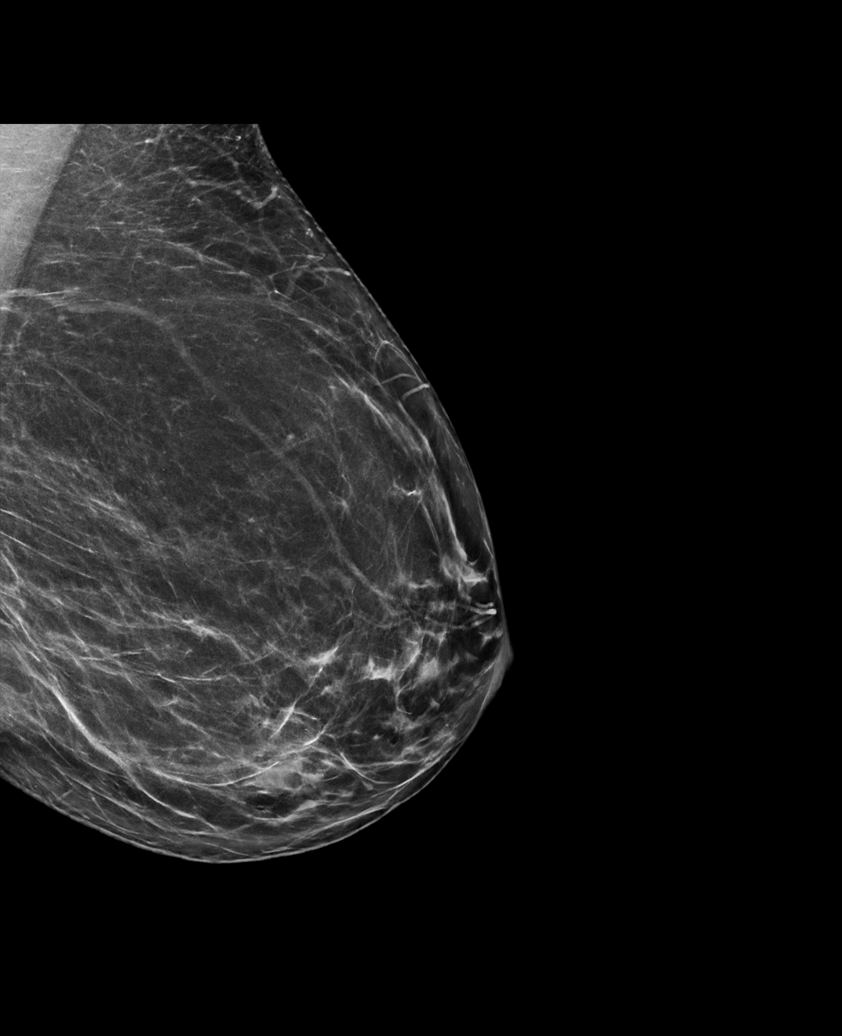

[L CC synth-2D]
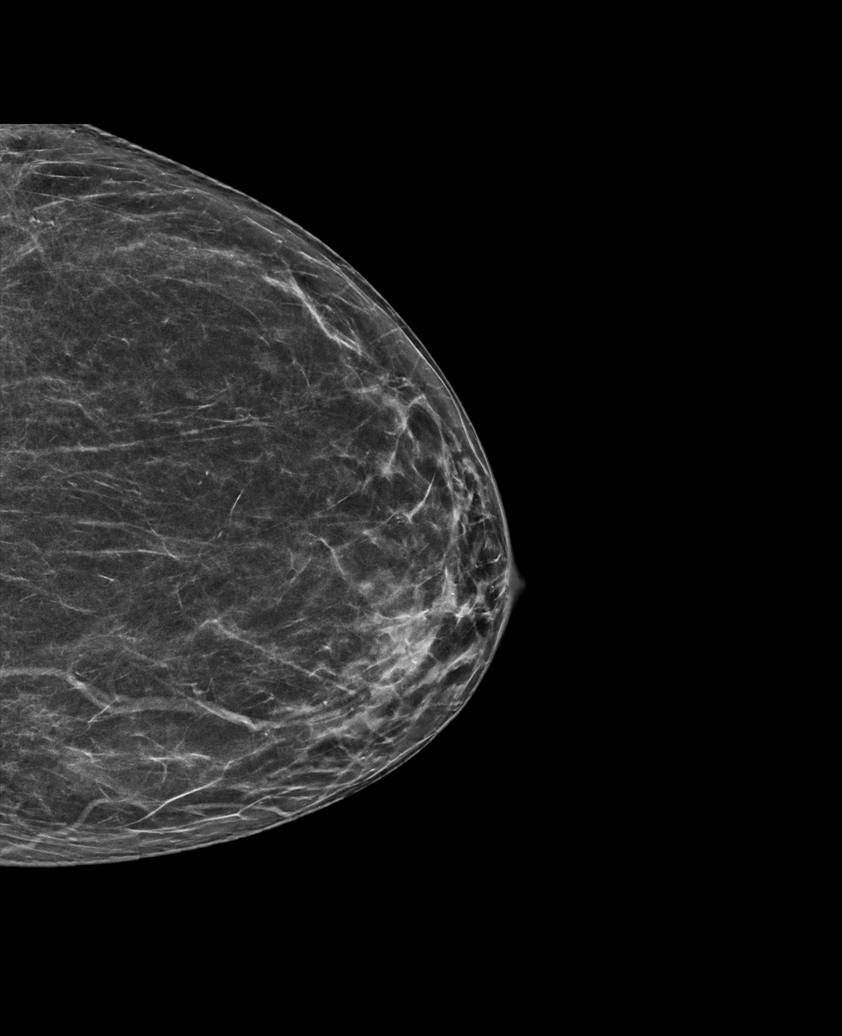

[R MLO synth-2D]
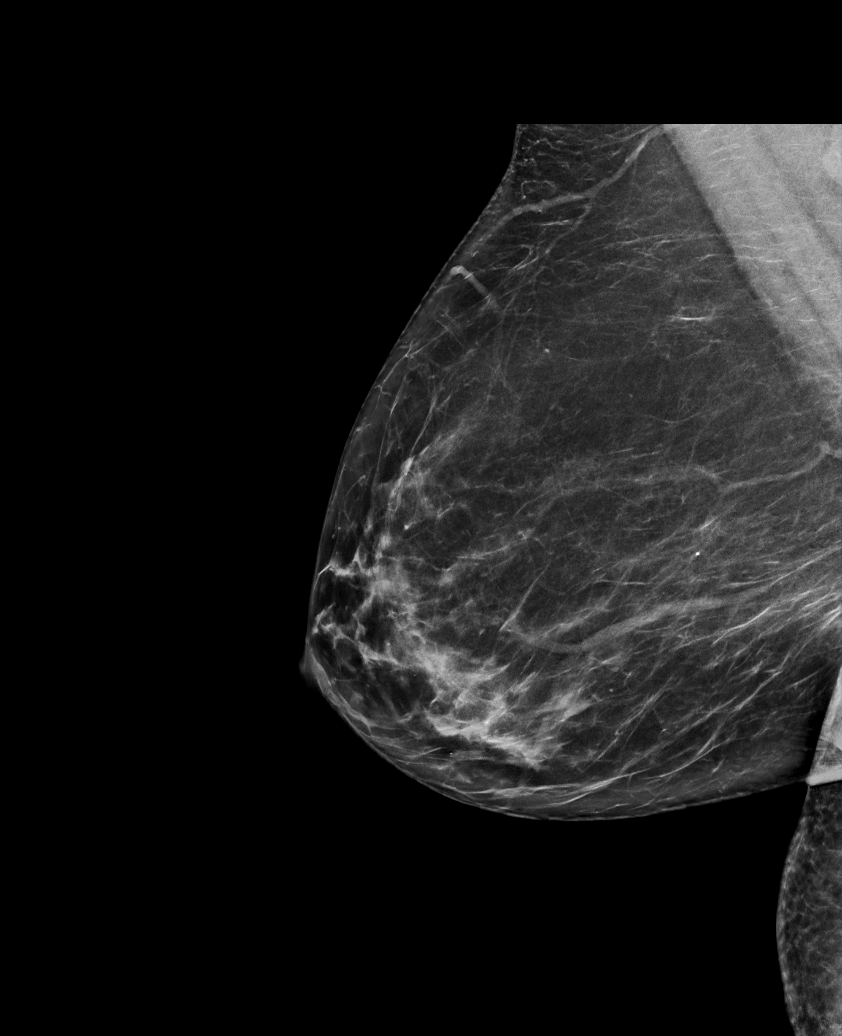

[R CC synth-2D]
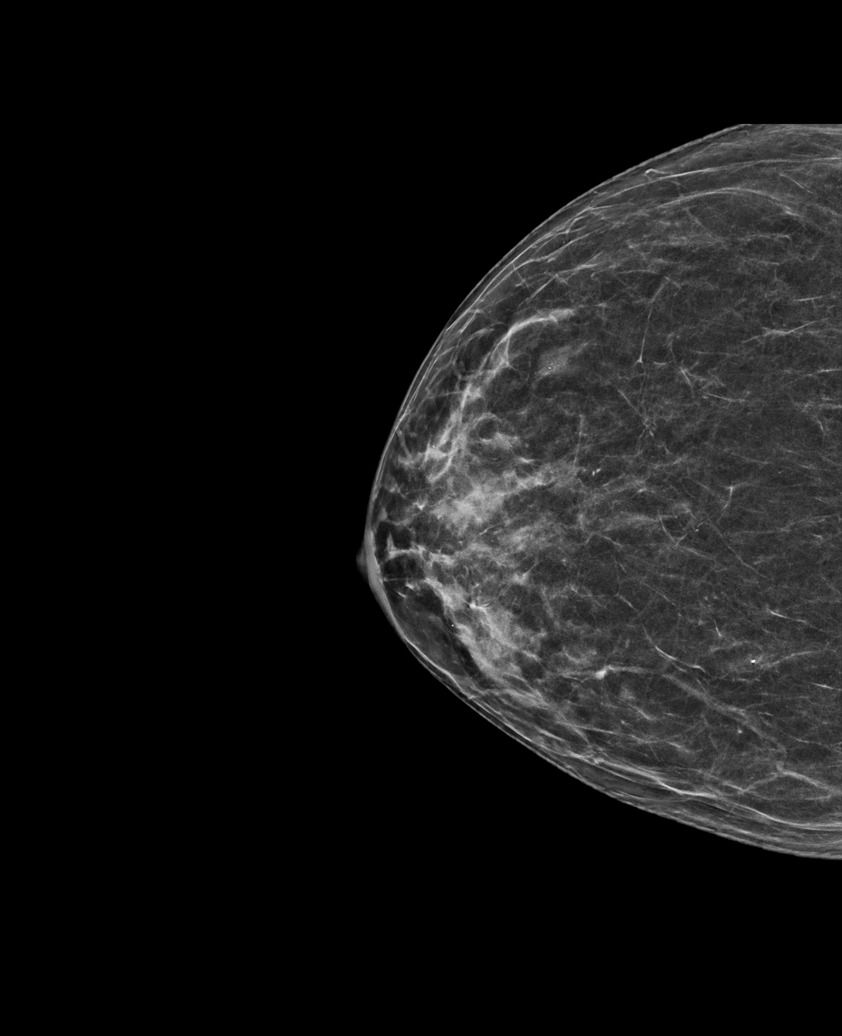

[R MLO tomo · tomo slice 37/74.0]
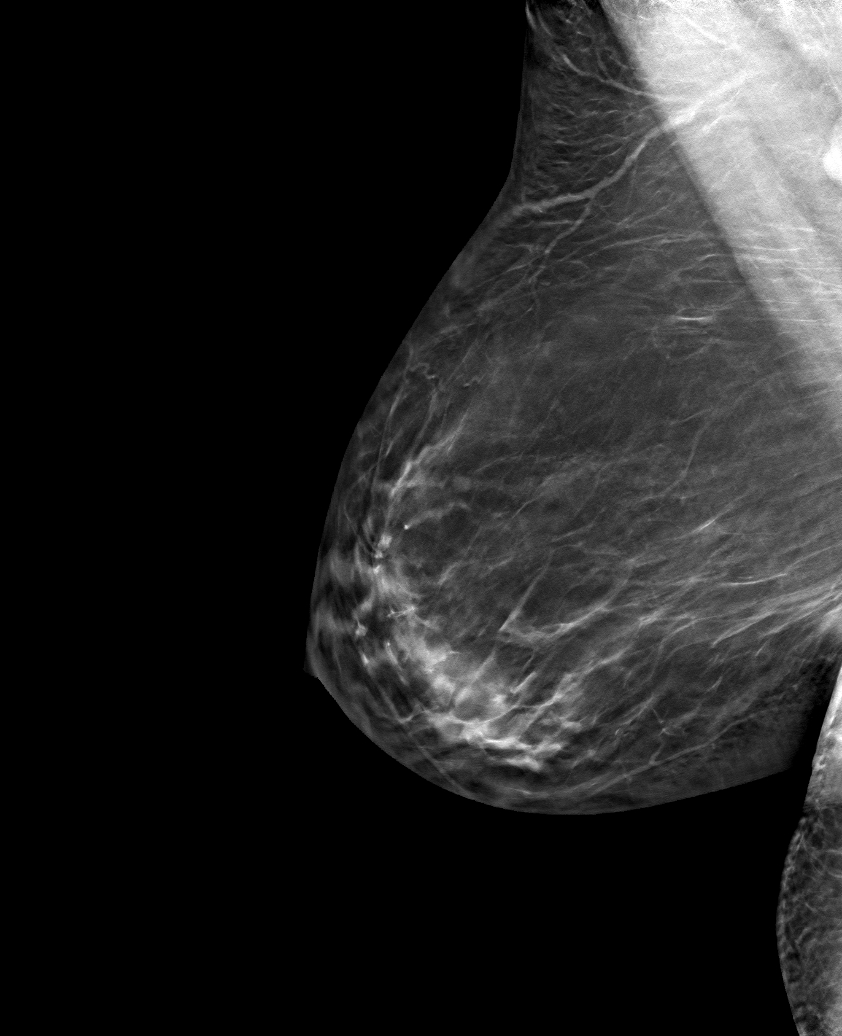

[6 of 30 positions shown; findings below may reference images not displayed]

ACR Breast Density Category b: There are scattered areas of
fibroglandular density.
FINDINGS: There are no findings suspicious for malignancy.
IMPRESSION: No mammographic evidence of malignancy. A result letter of this
screening mammogram will be mailed directly to the patient.

RECOMMENDATION:
Screening mammogram in one year. (Code:51-O-LD2)

BI-RADS CATEGORY  1: Negative.

## 2022-12-20 ENCOUNTER — Ambulatory Visit (INDEPENDENT_AMBULATORY_CARE_PROVIDER_SITE_OTHER): Payer: 59 | Admitting: Podiatry

## 2022-12-20 VITALS — BP 140/67

## 2022-12-20 DIAGNOSIS — E1142 Type 2 diabetes mellitus with diabetic polyneuropathy: Secondary | ICD-10-CM | POA: Diagnosis not present

## 2022-12-20 DIAGNOSIS — B351 Tinea unguium: Secondary | ICD-10-CM

## 2022-12-20 DIAGNOSIS — M79674 Pain in right toe(s): Secondary | ICD-10-CM

## 2022-12-20 DIAGNOSIS — M79675 Pain in left toe(s): Secondary | ICD-10-CM

## 2022-12-20 NOTE — Patient Instructions (Signed)
Ciclopirox Topical Solution What is this medication? CICLOPIROX (sye kloe PEER ox) treats fungal infections of the nails. It belongs to a group of medications called antifungals. It will not treat infections caused by bacteria or viruses. This medicine may be used for other purposes; ask your health care provider or pharmacist if you have questions. COMMON BRAND NAME(S): Ciclodan Nail Solution, CNL8, [place] What should I tell my care team before I take this medication? They need to know if you have any of these conditions: Diabetes (high blood sugar) Immune system problems Organ transplant Receiving steroid inhalers, cream, or lotion Seizures Tingling of the fingers or toes or other nerve disorder An unusual or allergic reaction to ciclopirox, other medications, foods, dyes, or preservatives Pregnant or trying to get pregnant Breast-feeding How should I use this medication? This medication is for external use only. Do not take by mouth. Wash your hands before and after use. If you are treating your hands, only wash your hands before use. Do not get it in your eyes. If you do, rinse your eyes with plenty of cool tap water. Use it as directed on the prescription label at the same time every day. Do not use it more often than directed. Use the medication for the full course as directed by your care team, even if you think you are better. Do not stop using it unless your care team tells you to stop it early. Apply a thin film of the medication to the affected area. Talk to your care team about the use of this medication in children. While it may be prescribed for children as young as 12 years for selected conditions, precautions do apply. Overdosage: If you think you have taken too much of this medicine contact a poison control center or emergency room at once. NOTE: This medicine is only for you. Do not share this medicine with others. What if I miss a dose? If you miss a dose, use it as soon as  you can. If it is almost time for your next dose, use only that dose. Do not use double or extra doses. What may interact with this medication? Interactions are not expected. Do not use any other skin products without telling your care team. This list may not describe all possible interactions. Give your health care provider a list of all the medicines, herbs, non-prescription drugs, or dietary supplements you use. Also tell them if you smoke, drink alcohol, or use illegal drugs. Some items may interact with your medicine. What should I watch for while using this medication? Visit your care team for regular checks on your progress. It may be some time before you see the benefit from this medication. Do not use nail polish or other nail cosmetic products on the treated nails. Removal of the unattached, infected nail by your care team is needed with use of this medication. If you have diabetes or numbness in your fingers or toes, talk to your care team about proper nail care. What side effects may I notice from receiving this medication? Side effects that you should report to your care team as soon as possible: Allergic reactions--skin rash, itching, hives, swelling of the face, lips, tongue, or throat Burning, itching, crusting, or peeling of treated skin Side effects that usually do not require medical attention (report to your care team if they continue or are bothersome): Change in nail shape, thickness, or color Mild skin irritation, redness, or dryness This list may not describe all possible side   effects. Call your doctor for medical advice about side effects. You may report side effects to FDA at 1-800-FDA-1088. Where should I keep my medication? Keep out of the reach of children and pets. Store at room temperature between 20 and 25 degrees C (68 and 77 degrees F). This medication is flammable. Avoid exposure to heat, fire, flame, and smoking. Get rid of medications that are no longer needed  or have expired: Take the medication to a medication take-back program. Check with your pharmacy or law enforcement to find a location. If you cannot return the medication, check the label or package insert to see if the medication should be thrown out in the garbage or flushed down the toilet. If you are not sure, ask your care team. If it is safe to put in the trash, take the medication out of the container. Mix the medication with cat litter, dirt, coffee grounds, or other unwanted substance. Seal the mixture in a bag or container. Put it in the trash. NOTE: This sheet is a summary. It may not cover all possible information. If you have questions about this medicine, talk to your doctor, pharmacist, or health care provider.  2023 Elsevier/Gold Standard (2021-09-08 00:00:00)  

## 2022-12-20 NOTE — Progress Notes (Signed)
  Subjective:  Patient ID: Deborah Weeks, female    DOB: [birth year],  MRN: [id number]  Deborah Weeks presents to clinic today for at risk foot care with history of diabetic neuropathy and painful thick toenails that are difficult to trim. Pain interferes with ambulation. Aggravating factors include wearing enclosed shoe gear. Pain is relieved with periodic professional debridement.  Chief Complaint  Patient presents with   Nail Problem    DFC BS-did not check today A1C-7.1 PCP-[facility] PCP VST-11/2022   New problem(s): None.   PCP is Caren Macadam, MD.  Allergies  Allergen Reactions   Lexapro [Escitalopram Oxalate] Other (See Comments)    Insomnia, but patient currently takes this   Lipitor [Atorvastatin] Other (See Comments)    Cramps    Escitalopram     Other reaction(s): keeps her up at night   Iodinated Contrast Media Nausea And Vomiting   Xanax [Alprazolam] Other (See Comments)    "Felt like I had a hangover", HA   Codeine Nausea And Vomiting    Other reaction(s): vomiting   Neurontin [Gabapentin] Other (See Comments)    Makes pt shake    Niacin And Related Itching and Rash   Spironolactone Other (See Comments)    hyperkalemia    Review of Systems: Negative except as noted in the HPI.  Objective: No changes noted in today's physical examination. Vitals:   12/20/22 1633  BP: (!) 140/67   Deborah Weeks is a pleasant 77 y.o. female obese in NAD. AAO x 3.  Vascular Examination: Vascular status intact b/l with palpable DP pedal pulses; faintly palpable PT pulses b/l. Pedal hair present b/l. CFT immediate b/l. No pain with calf compression b/l. Skin temperature gradient WNL b/l. Trace edema noted BLE. No cyanosis or clubbing noted b/l LE.  Neurological Examination: Sensation grossly intact b/l with 10 gram monofilament. Vibratory sensation intact b/l.   Dermatological Examination: Pedal skin with normal turgor, texture and tone b/l. Toenails 1-5 b/l thick,  discolored, elongated with subungual debris and pain on dorsal palpation.   Musculoskeletal Examination: Muscle strength 5/5 to b/l LE. No pain, crepitus or joint limitation noted with ROM bilateral LE. No gross bony deformities bilaterally.  Radiographs: None Assessment/Plan: 1. Pain due to onychomycosis of toenails of both feet   2. Diabetic polyneuropathy associated with type 2 diabetes mellitus ([place])     -Patient was evaluated and treated. All patient's and/or POA's questions/concerns answered on today's visit. -Continue foot and shoe inspections daily. Monitor blood glucose per PCP/Endocrinologist's recommendations. -Patient to continue soft, supportive shoe gear daily. -Mycotic toenails 1-5 bilaterally were debrided in length and girth with sterile nail nippers and dremel without incident. -Patient/POA to call should there be question/concern in the interim.   Return in about 3 months (around 03/22/2023).  Marzetta Board, DPM

## 2022-12-24 ENCOUNTER — Encounter: Payer: Self-pay | Admitting: Podiatry

## 2023-01-14 DIAGNOSIS — J449 Chronic obstructive pulmonary disease, unspecified: Secondary | ICD-10-CM | POA: Diagnosis not present

## 2023-02-13 DIAGNOSIS — J449 Chronic obstructive pulmonary disease, unspecified: Secondary | ICD-10-CM | POA: Diagnosis not present

## 2023-02-18 DIAGNOSIS — E1122 Type 2 diabetes mellitus with diabetic chronic kidney disease: Secondary | ICD-10-CM | POA: Diagnosis not present

## 2023-02-18 DIAGNOSIS — N1832 Chronic kidney disease, stage 3b: Secondary | ICD-10-CM | POA: Diagnosis not present

## 2023-02-18 DIAGNOSIS — I272 Pulmonary hypertension, unspecified: Secondary | ICD-10-CM | POA: Diagnosis not present

## 2023-02-18 DIAGNOSIS — I130 Hypertensive heart and chronic kidney disease with heart failure and stage 1 through stage 4 chronic kidney disease, or unspecified chronic kidney disease: Secondary | ICD-10-CM | POA: Diagnosis not present

## 2023-02-18 DIAGNOSIS — G720 Drug-induced myopathy: Secondary | ICD-10-CM | POA: Diagnosis not present

## 2023-02-18 DIAGNOSIS — E7800 Pure hypercholesterolemia, unspecified: Secondary | ICD-10-CM | POA: Diagnosis not present

## 2023-02-18 DIAGNOSIS — E1142 Type 2 diabetes mellitus with diabetic polyneuropathy: Secondary | ICD-10-CM | POA: Diagnosis not present

## 2023-02-18 DIAGNOSIS — T466X5A Adverse effect of antihyperlipidemic and antiarteriosclerotic drugs, initial encounter: Secondary | ICD-10-CM | POA: Diagnosis not present

## 2023-02-18 DIAGNOSIS — I480 Paroxysmal atrial fibrillation: Secondary | ICD-10-CM | POA: Diagnosis not present

## 2023-02-18 DIAGNOSIS — I5032 Chronic diastolic (congestive) heart failure: Secondary | ICD-10-CM | POA: Diagnosis not present

## 2023-02-18 DIAGNOSIS — E1165 Type 2 diabetes mellitus with hyperglycemia: Secondary | ICD-10-CM | POA: Diagnosis not present

## 2023-03-14 ENCOUNTER — Other Ambulatory Visit: Payer: Self-pay | Admitting: Cardiology

## 2023-04-26 ENCOUNTER — Ambulatory Visit (INDEPENDENT_AMBULATORY_CARE_PROVIDER_SITE_OTHER): Payer: 59 | Admitting: Podiatry

## 2023-04-26 VITALS — BP 120/60

## 2023-04-26 DIAGNOSIS — B351 Tinea unguium: Secondary | ICD-10-CM

## 2023-04-26 DIAGNOSIS — M79675 Pain in left toe(s): Secondary | ICD-10-CM | POA: Diagnosis not present

## 2023-04-26 DIAGNOSIS — B353 Tinea pedis: Secondary | ICD-10-CM | POA: Diagnosis not present

## 2023-04-26 DIAGNOSIS — M79674 Pain in right toe(s): Secondary | ICD-10-CM | POA: Diagnosis not present

## 2023-04-26 DIAGNOSIS — E1142 Type 2 diabetes mellitus with diabetic polyneuropathy: Secondary | ICD-10-CM | POA: Diagnosis not present

## 2023-04-26 MED ORDER — KETOCONAZOLE 2 % EX CREA
TOPICAL_CREAM | CUTANEOUS | 1 refills | Status: AC
Start: 2023-04-26 — End: ?

## 2023-04-26 NOTE — Progress Notes (Signed)
  Subjective:  Patient ID: Deborah Weeks, female    DOB: [birth year],  MRN: [id number]  Deborah Weeks presents to clinic today for at risk foot care with history of diabetic neuropathy  Chief Complaint  Patient presents with   Nail Problem    dfc   New problem(s): None.   PCP is Aliene Beams, MD.  Allergies  Allergen Reactions   Lexapro [Escitalopram Oxalate] Other (See Comments)    Insomnia, but patient currently takes this   Lipitor [Atorvastatin] Other (See Comments)    Cramps    Escitalopram     Other reaction(s): keeps her up at night   Iodinated Contrast Media Nausea And Vomiting   Xanax [Alprazolam] Other (See Comments)    "Felt like I had a hangover", HA   Codeine Nausea And Vomiting    Other reaction(s): vomiting   Neurontin [Gabapentin] Other (See Comments)    Makes pt shake    Niacin And Related Itching and Rash   Spironolactone Other (See Comments)    hyperkalemia    Review of Systems: Negative except as noted in the HPI.  Objective:  Vitals:   04/26/23 1327  BP: 120/60   Deborah Weeks is a pleasant 77 y.o. female in NAD. AAO x 3.  Vascular Examination: Vascular status intact b/l with palpable DP pedal pulses; faintly palpable PT pulses b/l. Pedal hair present b/l. CFT immediate b/l. No pain with calf compression b/l. Skin temperature gradient WNL b/l. Trace edema noted BLE. No cyanosis or clubbing noted b/l LE.  Neurological Examination: Sensation grossly intact b/l with 10 gram monofilament. Vibratory sensation intact b/l.   Dermatological Examination: Pedal skin with normal turgor, texture and tone b/l. Toenails 1-5 b/l thick, discolored, elongated with subungual debris and pain on dorsal palpation.   Diffuse scaling noted peripherally and plantarly b/l feet.  No interdigital macerations.  No blisters, no weeping. No signs of secondary bacterial infection noted.  Musculoskeletal Examination: Muscle strength 5/5 to b/l LE. No pain, crepitus  or joint limitation noted with ROM bilateral LE. No gross bony deformities bilaterally.  Radiographs: None  Assessment/Plan: 1. Pain due to onychomycosis of toenails of both feet   2. Tinea pedis of both feet   3. Diabetic polyneuropathy associated with type 2 diabetes mellitus (HCC)     Meds ordered this encounter  Medications   ketoconazole (NIZORAL) 2 % cream    Sig: Apply to both feet and between toes once daily for 6 weeks.    Dispense:  60 g    Refill:  1   -Consent given for treatment as described below: -Examined patient. -Continue foot and shoe inspections daily. Monitor blood glucose per PCP/Endocrinologist's recommendations. -Toenails 1-5 b/l were debrided in length and girth with sterile nail nippers and dremel without iatrogenic bleeding.  -Discussed tinea pedis infection. To prevent re-infection of tinea pedis, patient/POA/caregiver instructed to spray shoes with Lysol every evening and clean tub/shower with bleach based cleanser. -For tinea pedis, Rx sent to pharmacy for Ketoconazole Cream 2% to be applied once daily for six weeks. -Patient/POA to call should there be question/concern in the interim.   Return in about 3 months (around 07/27/2023).  Freddie Breech, DPM

## 2023-05-01 ENCOUNTER — Encounter: Payer: Self-pay | Admitting: Podiatry

## 2023-05-17 IMAGING — US US RENAL ARTERY STENOSIS
1 series · 13 of 25 positions shown · non-contrast
Comparison: Chest XR, 09/22/2019.  CT AP, 05/01/2010.

CLINICAL DATA: Hypertension.  CKD.  Diabetes.

EXAM:
RENAL/URINARY TRACT ULTRASOUND
RENAL DUPLEX DOPPLER ULTRASOUND

[Series 1: us renal artery stenosis · 0.23mm/px · 13 of 58 slices shown]
[im 1/58]
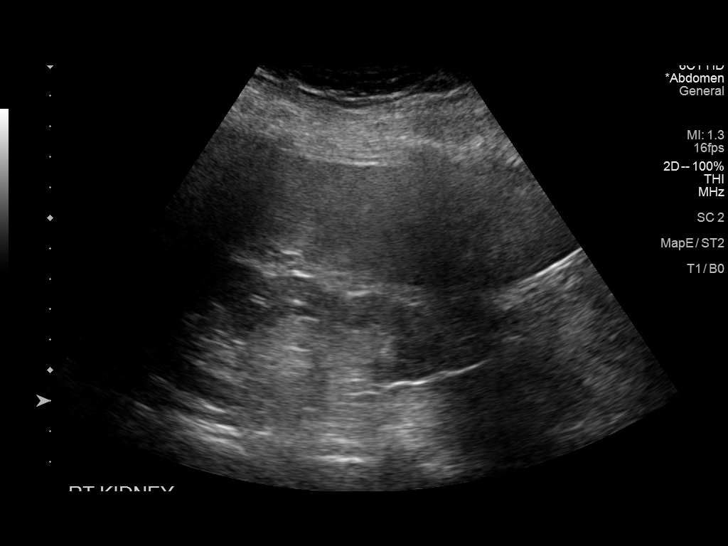
[im 5/58]
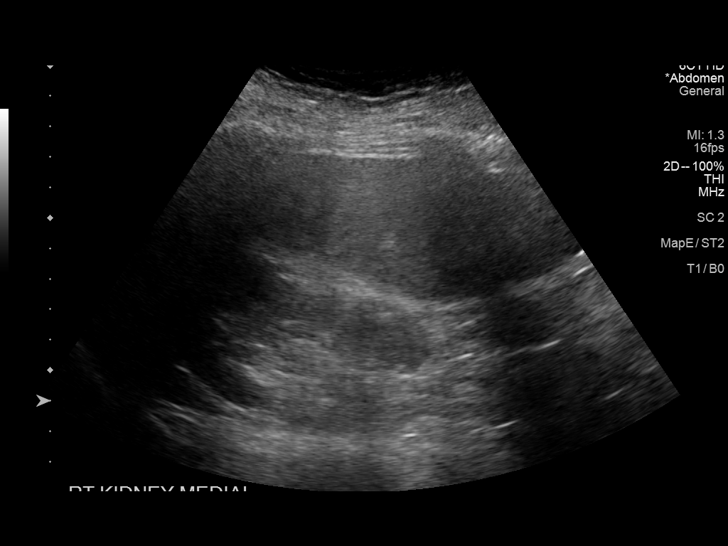
[im 10/58]
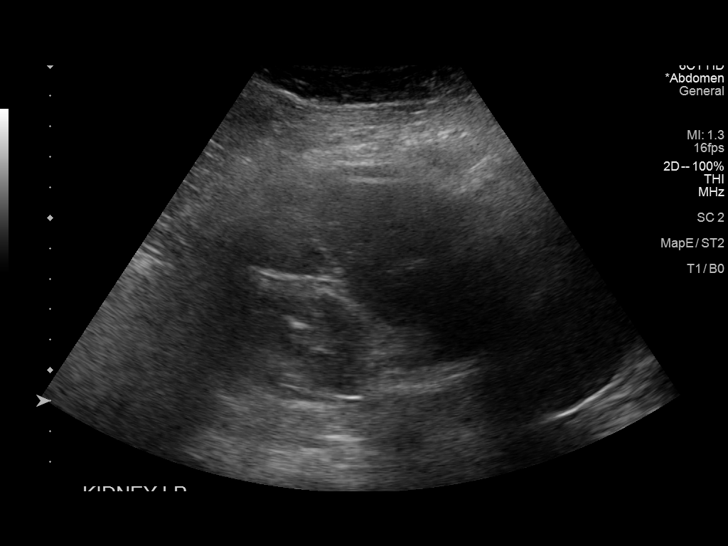
[im 15/58]
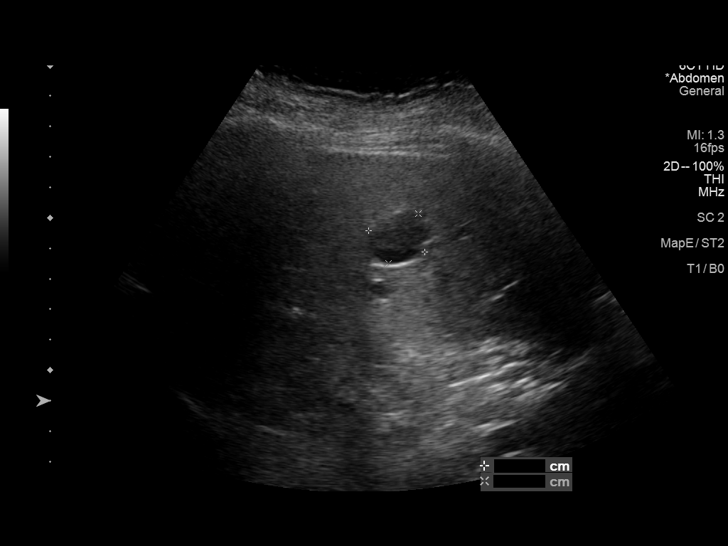
[im 20/58]
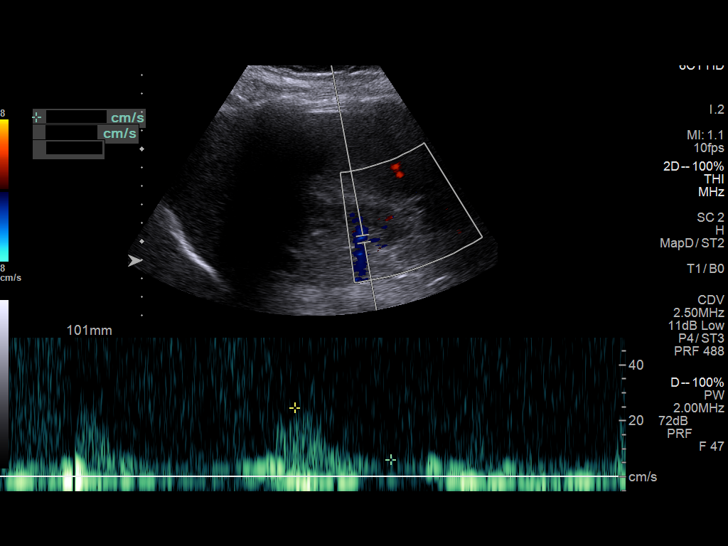
[im 24/58]
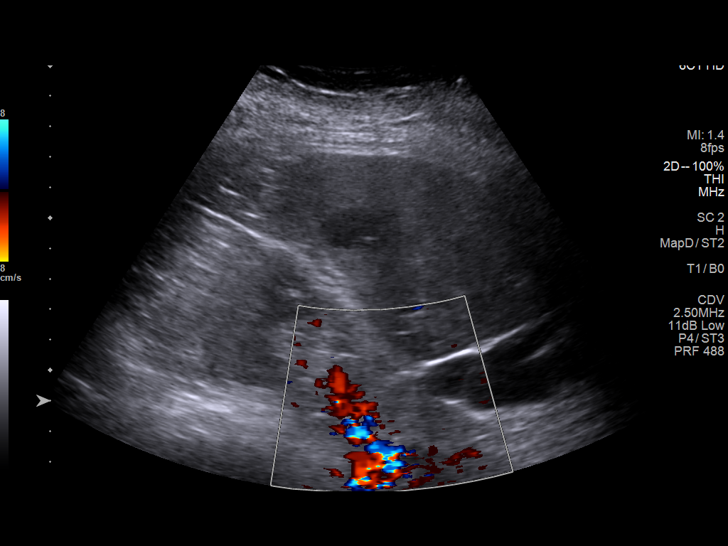
[im 29/58]
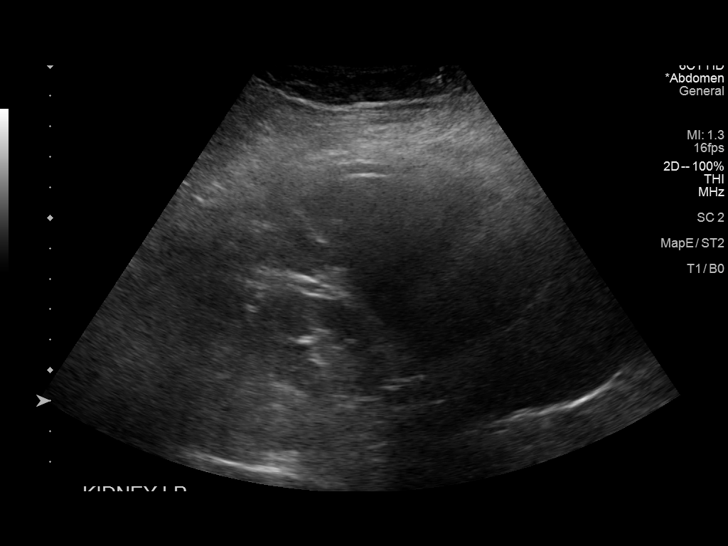
[im 34/58]
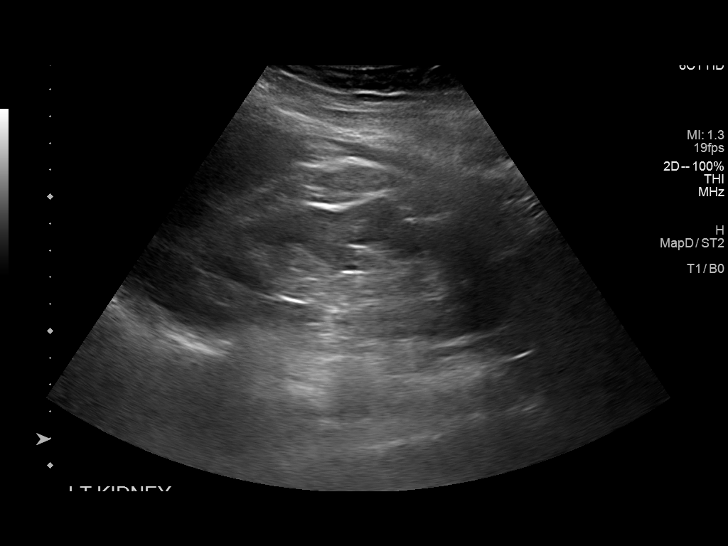
[im 39/58]
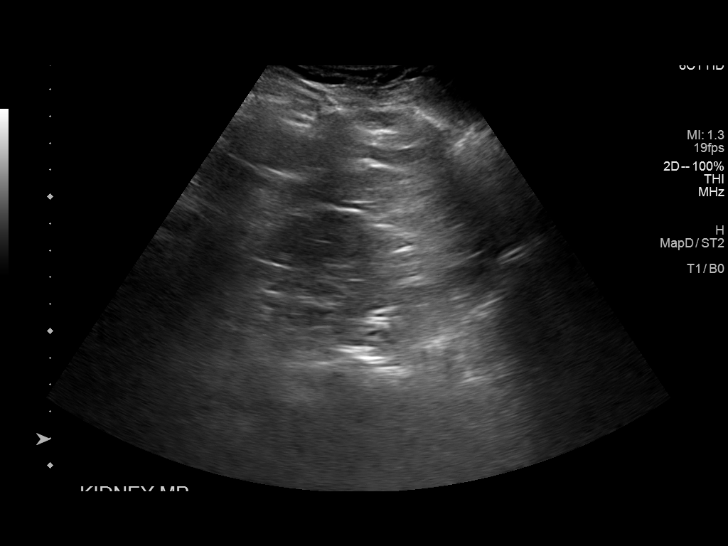
[im 43/58]
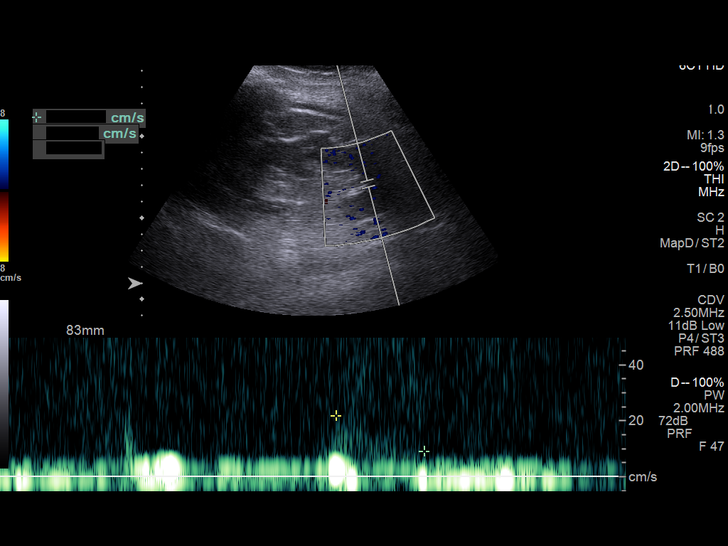
[im 48/58]
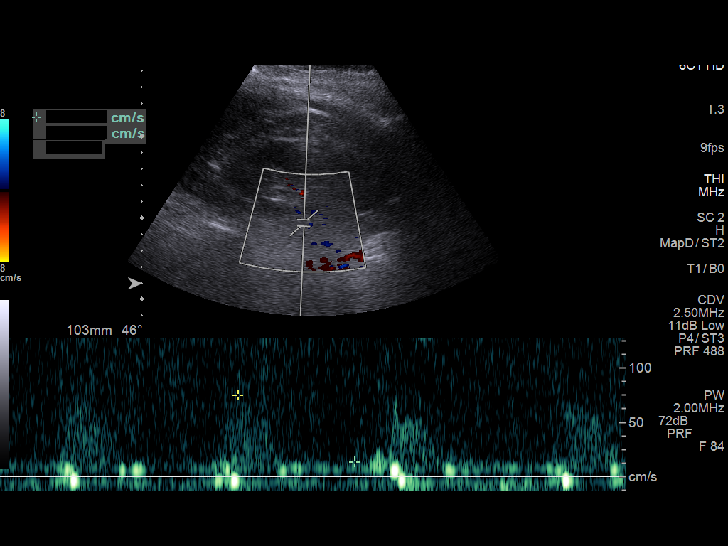
[im 53/58]
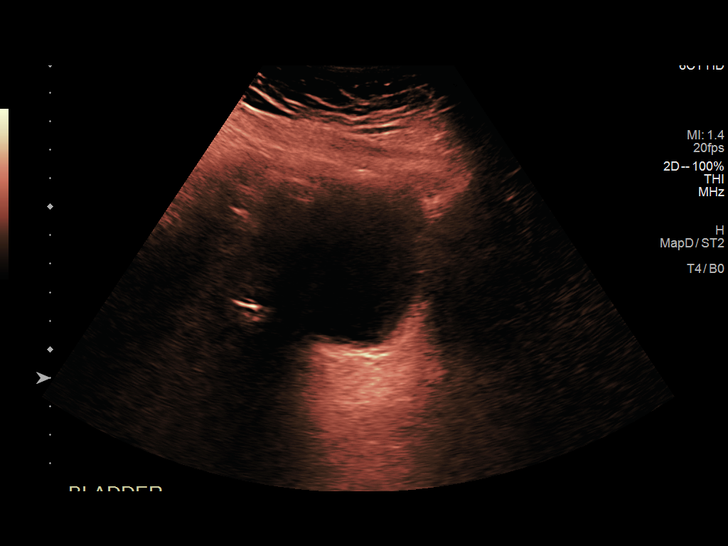
[im 58/58]
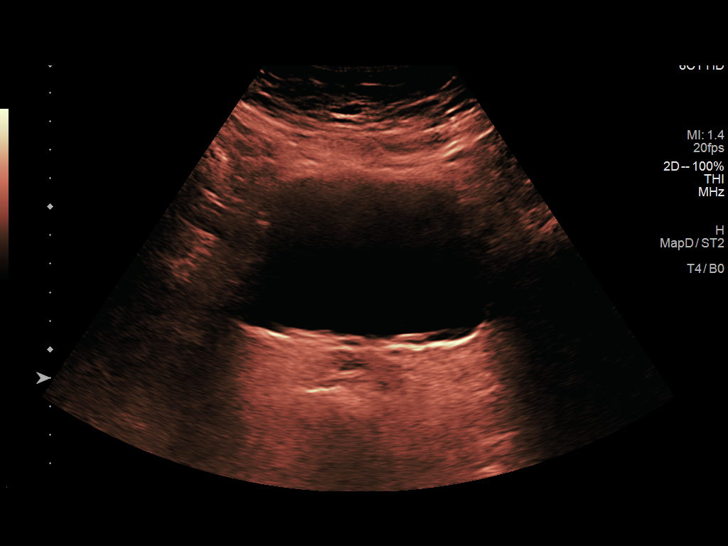

[13 of 25 positions shown; findings below may reference images not displayed]

FINDINGS: Right Kidney:

Length: 10.8 cm. Echogenicity within normal limits. No
hydronephrosis

Punctate echogenic renal collecting system foci with "twinkle"
artifact, largest measuring up to 0.6 cm, and consistent with
calculi.

Left Kidney:

Length: 12.1 cm. Echogenicity within normal limits. No
hydronephrosis

Punctate echogenic renal collecting system foci with "twinkle"
artifact largest measuring 0.7 cm, and consistent with calculi.

Known LEFT renal cyst is not well appreciated on this evaluation.

Bladder:  Normal nondistended appearance.

Incidental, multifocal well-circumscribed hepatic hypodense lesions
largest measuring up to 3.6 cm and likely to represent hepatic
cysts.

RENAL DUPLEX ULTRASOUND

Right Renal Artery Velocities:

Origin:  102 cm/sec

Mid:  67 cm/sec

Hilum:  59 cm/sec

Interlobar:  31 cm/sec

Arcuate:  25 cm/sec

The RIGHT renal vein is patent.

Left Renal Artery Velocities:

Origin:  63 cm/sec

Mid:  75 cm/sec

Hilum:  53 cm/sec

Interlobar:  35 cm/sec

Arcuate:  32 cm/sec

The LEFT renal vein is patent.

Aortic Velocity:  98 cm/sec

Right Renal-Aortic Ratios:

Origin:

Mid:

Hilum:

Interlobar:

Arcuate:

Left Renal-Aortic Ratios:

Origin:

Mid:

Hilum:

Interlobar:

Arcuate:
IMPRESSION: 1. No Doppler evidence of renal artery stenosis.
2. Bilateral nonobstructing nephrolithiasis.
3. Incidental RIGHT hepatic cysts.

## 2023-06-03 DIAGNOSIS — I5032 Chronic diastolic (congestive) heart failure: Secondary | ICD-10-CM | POA: Diagnosis not present

## 2023-06-03 DIAGNOSIS — Z9981 Dependence on supplemental oxygen: Secondary | ICD-10-CM | POA: Diagnosis not present

## 2023-06-03 DIAGNOSIS — N1832 Chronic kidney disease, stage 3b: Secondary | ICD-10-CM | POA: Diagnosis not present

## 2023-06-03 DIAGNOSIS — I480 Paroxysmal atrial fibrillation: Secondary | ICD-10-CM | POA: Diagnosis not present

## 2023-06-03 DIAGNOSIS — E1140 Type 2 diabetes mellitus with diabetic neuropathy, unspecified: Secondary | ICD-10-CM | POA: Diagnosis not present

## 2023-06-03 DIAGNOSIS — E7800 Pure hypercholesterolemia, unspecified: Secondary | ICD-10-CM | POA: Diagnosis not present

## 2023-06-03 DIAGNOSIS — E1122 Type 2 diabetes mellitus with diabetic chronic kidney disease: Secondary | ICD-10-CM | POA: Diagnosis not present

## 2023-06-03 DIAGNOSIS — E1165 Type 2 diabetes mellitus with hyperglycemia: Secondary | ICD-10-CM | POA: Diagnosis not present

## 2023-06-03 DIAGNOSIS — Z79899 Other long term (current) drug therapy: Secondary | ICD-10-CM | POA: Diagnosis not present

## 2023-06-03 DIAGNOSIS — G629 Polyneuropathy, unspecified: Secondary | ICD-10-CM | POA: Diagnosis not present

## 2023-06-03 DIAGNOSIS — Z0000 Encounter for general adult medical examination without abnormal findings: Secondary | ICD-10-CM | POA: Diagnosis not present

## 2023-06-03 DIAGNOSIS — I130 Hypertensive heart and chronic kidney disease with heart failure and stage 1 through stage 4 chronic kidney disease, or unspecified chronic kidney disease: Secondary | ICD-10-CM | POA: Diagnosis not present

## 2023-06-03 DIAGNOSIS — I272 Pulmonary hypertension, unspecified: Secondary | ICD-10-CM | POA: Diagnosis not present

## 2023-06-03 DIAGNOSIS — E538 Deficiency of other specified B group vitamins: Secondary | ICD-10-CM | POA: Diagnosis not present

## 2023-06-21 ENCOUNTER — Inpatient Hospital Stay (HOSPITAL_COMMUNITY)
Admission: RE | Admit: 2023-06-21 | Discharge: 2023-06-21 | Disposition: A | Discharge status: Home / Self Care | Payer: 59 | Source: Ambulatory Visit | Attending: Internal Medicine | Admitting: Internal Medicine

## 2023-06-21 ENCOUNTER — Encounter (HOSPITAL_COMMUNITY): Payer: Self-pay | Admitting: Internal Medicine

## 2023-06-21 ENCOUNTER — Other Ambulatory Visit (HOSPITAL_COMMUNITY): Payer: Self-pay | Admitting: Internal Medicine

## 2023-06-21 ENCOUNTER — Ambulatory Visit (HOSPITAL_COMMUNITY)
Admission: RE | Admit: 2023-06-21 | Discharge: 2023-06-21 | Disposition: A | Discharge status: Home / Self Care | Payer: 59 | Source: Ambulatory Visit | Attending: Internal Medicine | Admitting: Internal Medicine

## 2023-06-21 VITALS — BP 130/70 | HR 53 | Wt 213.8 lb

## 2023-06-21 DIAGNOSIS — Z6831 Body mass index (BMI) 31.0-31.9, adult: Secondary | ICD-10-CM | POA: Diagnosis not present

## 2023-06-21 DIAGNOSIS — Z87891 Personal history of nicotine dependence: Secondary | ICD-10-CM | POA: Diagnosis not present

## 2023-06-21 DIAGNOSIS — I10 Essential (primary) hypertension: Secondary | ICD-10-CM | POA: Diagnosis not present

## 2023-06-21 DIAGNOSIS — I480 Paroxysmal atrial fibrillation: Secondary | ICD-10-CM | POA: Insufficient documentation

## 2023-06-21 DIAGNOSIS — I2510 Atherosclerotic heart disease of native coronary artery without angina pectoris: Secondary | ICD-10-CM | POA: Diagnosis not present

## 2023-06-21 DIAGNOSIS — Z7901 Long term (current) use of anticoagulants: Secondary | ICD-10-CM | POA: Insufficient documentation

## 2023-06-21 DIAGNOSIS — I493 Ventricular premature depolarization: Secondary | ICD-10-CM

## 2023-06-21 DIAGNOSIS — R0683 Snoring: Secondary | ICD-10-CM | POA: Diagnosis not present

## 2023-06-21 DIAGNOSIS — R002 Palpitations: Secondary | ICD-10-CM | POA: Insufficient documentation

## 2023-06-21 DIAGNOSIS — I272 Pulmonary hypertension, unspecified: Secondary | ICD-10-CM | POA: Insufficient documentation

## 2023-06-21 DIAGNOSIS — I5032 Chronic diastolic (congestive) heart failure: Secondary | ICD-10-CM | POA: Insufficient documentation

## 2023-06-21 NOTE — Progress Notes (Signed)
Height: 5'9"    Weight: 213 lbs BMI: 30.32  Today's Date: 06/21/23  STOP BANG RISK ASSESSMENT S (snore) Have you been told that you snore?     YES   T (tired) Are you often tired, fatigued, or sleepy during the day?   YES  O (obstruction) Do you stop breathing, choke, or gasp during sleep? NO   P (pressure) Do you have or are you being treated for high blood pressure? YES   B (BMI) Is your body index greater than 35 kg/m? NO   A (age) Are you 77 years old or older? YES   N (neck) Do you have a neck circumference greater than 16 inches?      G (gender) Are you a female? NO   TOTAL STOP/BANG "YES" ANSWERS 4                                                                       For Office Use Only              Procedure Order Form    YES to 3+ Stop Bang questions OR two clinical symptoms - patient qualifies for WatchPAT (CPT 95800)      Clinical Notes: Will consult Sleep Specialist and refer for management of therapy due to patient increased risk of Sleep Apnea. Ordering a sleep study due to the following two clinical symptoms: Excessive daytime sleepiness G47.10 / History of high blood pressure R03.0

## 2023-06-21 NOTE — Progress Notes (Signed)
[facility]  NOTE  Referring Physician:Kathryn Janee Morn, PA-C Primary Care:  Aliene Beams, MD Primary Cardiologist: Donato Schultz  HPI:  Emine Fazenbaker is a 77 y.o. female with a hx of morbid obesity, HTN, DM2 with neuropathy, lupus, chronic diastolic CHF, previous tobacco abuse, PAF and severe pulmonary hypertension by echo 3/23 who is referred by Carlean Jews, PA-C for further evaluation of dyspnea and PH.    Echo 1/20 60-65% intdeterminate DD. Had episode of AF during admission for sepsis/resp failure   She wore a heart monitor after this admission which showed no atrial fibrillation.  Since she had an isolated episode of A-fib in the setting of acute illness with no evidence for recurrence, it was decided to defer long-term oral anticoagulation.   2D echo 01/12/22  EF 65-70%, severe LAE, with restrictive filling pattern E/E" 29.2 mildly enlarged RV, severe pulm HTN 74.6 mm hg and mild to mod MR.    She was seen back by Dr. Delton Coombes on 01/15/2022 who felt her pulmonary hypertension was multifactorial.  He ordered a nocturnal oximetry, PFTs and a VQ scan   VQ scan 01/28/2022: normal PFT 01/29/22: FEV1 1/29 (66%) FVC 1.62 (64% DLCO 62%  R/LHC 02/24/22:  1 v CAD with 70% mRCA (med management recommended)  Findings:   Ao = 136/56 (90) LV = 140/16 RA = 2 RV = 59/6 PA = 58/8 (38) PCW = 15 (v = 26) Fick cardiac output/index = 6.8/3.3 PVR = 3.4 WU SVR = 1,034 FA sat = 91% PA sat = 66%, 67%   Echo 05/20/22 EF 65-70%, moderate LVH, grade III DD, RV okay, TR signal inadequate to assess PA pressure, sever LAE. No snoring.   cMRI 08/09/22 EF 64% RV normal No LGE.   Here for f/u. Says overall feeling ok but having palpitations. Says they come 1-2x/week and last a few secs and go away. Denies SOB, edema, orthopnea or PND. Chasing after her great granddaughter.     Past Medical History:  Diagnosis Date   Arthritis    Diabetes mellitus without complication (HCC)     High cholesterol    Hypertension    Lupus (HCC)    Neuropathy     Current Outpatient Medications  Medication Sig Dispense Refill   acetaminophen (TYLENOL) 500 MG tablet Take 1,000 mg by mouth every 8 (eight) hours as needed.     amLODipine (NORVASC) 5 MG tablet Take 5 mg by mouth daily.     apixaban (ELIQUIS) 5 MG TABS tablet Take 1 tablet (5 mg total) by mouth 2 (two) times daily. 60 tablet 6   Blood Glucose Monitoring Suppl (ONETOUCH VERIO FLEX SYSTEM) w/Device KIT 2 (two) times daily.     carvedilol (COREG) 12.5 MG tablet Take 1 tablet (12.5 mg total) by mouth 2 (two) times daily. 60 tablet 11   Cholecalciferol (VITAMIN D3 PO) Take 1,000 mg by mouth daily in the afternoon.     Cyanocobalamin (VITAMIN B12) 1000 MCG TBCR Take 1,000 mcg by mouth daily.     furosemide (LASIX) 20 MG tablet TAKE 1 TABLET BY MOUTH EVERY DAY 90 tablet 0   hydrALAZINE (APRESOLINE) 25 MG tablet Take 25 mg by mouth 2 (two) times daily.     ketoconazole (NIZORAL) 2 % cream Apply to both feet and between toes once daily for 6 weeks. 60 g 1   Lancets (ONETOUCH DELICA PLUS LANCET33G) MISC 3 (three) times daily.     ondansetron (ZOFRAN-ODT) 4 MG disintegrating tablet Take  1 tablet (4 mg total) by mouth every 8 (eight) hours as needed for nausea or vomiting. 20 tablet 0   ONETOUCH VERIO test strip 3 (three) times daily.     pregabalin (LYRICA) 75 MG capsule Take 75 mg by mouth 2 (two) times daily.     pregabalin (LYRICA) 75 MG capsule Take 100 mg by mouth at bedtime.     spironolactone (ALDACTONE) 25 MG tablet Take 0.5 tablets (12.5 mg total) by mouth daily. 15 tablet 3   GLYXAMBI 10-5 MG TABS Take 1 tablet by mouth every morning. (Patient not taking: Reported on 06/21/2023)     Current Facility-Administered Medications  Medication Dose Route Frequency Provider Last Rate Last Admin   triamcinolone acetonide (KENALOG) 10 MG/ML injection 10 mg  10 mg Other Once Asencion Islam, DPM        Allergies  Allergen Reactions    Lexapro [Escitalopram Oxalate] Other (See Comments)    Insomnia, but patient currently takes this   Lipitor [Atorvastatin] Other (See Comments)    Cramps    Escitalopram     Other reaction(s): keeps her up at night   Iodinated Contrast Media Nausea And Vomiting   Xanax [Alprazolam] Other (See Comments)    "Felt like I had a hangover", HA   Codeine Nausea And Vomiting    Other reaction(s): vomiting   Neurontin [Gabapentin] Other (See Comments)    Makes pt shake    Niacin And Related Itching and Rash   Spironolactone Other (See Comments)    hyperkalemia      Social History   Socioeconomic History   Marital status: Single    Spouse name: Not on file   Number of children: Not on file   Years of education: Not on file   Highest education level: Not on file  Occupational History   Not on file  Tobacco Use   Smoking status: Former    Current packs/day: 0.00    Average packs/day: 1 pack/day for 40.0 years (40.0 ttl pk-yrs)    Types: Cigarettes    Start date: 10/11/1976    Quit date: 10/11/2016    Years since quitting: 6.6   Smokeless tobacco: Never  Vaping Use   Vaping status: Never Used  Substance and Sexual Activity   Alcohol use: Never    Alcohol/week: 0.0 standard drinks of alcohol   Drug use: Never   Sexual activity: Not on file  Other Topics Concern   Not on file  Social History Narrative   Not on file   Social Determinants of Health   Financial Resource Strain: Not on file  Food Insecurity: Not on file  Transportation Needs: Not on file  Physical Activity: Not on file  Stress: Not on file  Social Connections: Not on file  Intimate Partner Violence: Not on file      Family History  Problem Relation Age of Onset   Diabetes Mother    Hypertension Mother    Diabetes Father    Hypertension Father    Breast cancer Cousin    Alpha-1 antitrypsin deficiency Neg Hx    Lupus Neg Hx    Neurofibromatosis Neg Hx    Coronary artery disease Neg Hx     Vitals:    06/21/23 1125  BP: 130/70  Pulse: (!) 53  SpO2: 96%  Weight: 97 kg (213 lb 12.8 oz)     PHYSICAL EXAM: General:  Well appearing. No resp difficulty HEENT: normal Neck: supple. no JVD. Carotids 2+ bilat; no  bruits. No lymphadenopathy or thryomegaly appreciated. Cor: PMI nondisplaced. Regular rate & rhythm. No rubs, gallops or murmurs. Lungs: clear Abdomen: obese soft, nontender, nondistended. No hepatosplenomegaly. No bruits or masses. Good bowel sounds. Extremities: no cyanosis, clubbing, rash, edema Neuro: alert & orientedx3, cranial nerves grossly intact. moves all 4 extremities w/o difficulty. Affect pleasant  ECG: Sinus brady 59 No ST-T wave abnormalities. Personally reviewed   ASSESSMENT & PLAN:   1. 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 Much improved with Jardiance & spiro - Need to watch salt and fluid intake - Continue aggressive volume management and weight loss efforts -- check sleep study   2. Chronic diastolic HF - Echo 05/20/22 EF 56-21%, moderate LVH, grade III DD, RV okay, TR signal inadequate to assess PA pressure, sever LAE. No snoring.  - cMRI 08/09/22 EF 64% RV normal No LGE. No amyloid  - Volume improve on Jardiance 10 and spiro 12.5  - Can decrease b-blocker as needed  3. CAD - LHC 05/23 with 70% mRCA (med management recommended) - No s/s angina  4. Paroxysmal atrial fibrillation - In NSR today - Continue Eliquis  5. Morbid obesity - Body mass index is 31.57 kg/m. - encouraged weight loss - considering switching Tradjenta to GLP1RA  6. Probable OSA - check sleep study  - if + f/u Dr. Delton Coombes   7. Palpitations - sounds like PVCs - Place Zio and get sleep study  Can f/u with [facility] for general cardiology f/u. I will see back in 1 year with echo to reasses  Arvilla Meres, MD  11:50 AM

## 2023-06-21 NOTE — Patient Instructions (Signed)
Medication Changes:  None, continue current medications  Lab Work:  none  Testing/Procedures:  Your provider has recommended that  you wear a Zio Patch for 14 days.  This monitor will record your heart rhythm for our review.  IF you have any symptoms while wearing the monitor please press the button.  If you have any issues with the patch or you notice a red or orange light on it please call the company at [phone].  Once you remove the patch please mail it back to the company as soon as possible so we can get the results.  Your provider has recommended that you have a home sleep study ([facility] Test).  We have provided you with the equipment in our office today. Please go ahead and download the app. DO NOT OPEN OR TAMPER WITH THE BOX UNTIL WE ADVISE YOU TO DO SO. Once insurance has approved the test our office will call you with PIN number and approval to proceed with testing. Once you have completed the test you just dispose of the equipment, the information is automatically uploaded to [place] via blue-tooth technology. If your test is positive for sleep apnea and you need a home CPAP machine you will be contacted by Dr Norris Cross office [facility]) to set this up.   Special Instructions // Education:  Do the following things EVERYDAY: Weigh yourself in the morning before breakfast. Write it down and keep it in a log. Take your medicines as prescribed Eat low salt foods--Limit salt (sodium) to 2000 mg per day.  Stay as active as you can everyday Limit all fluids for the day to less than 2 liters   Follow-Up in: 1 year with an echocardiogram   At the [facility], you and your health needs are our priority. We have a designated team specialized in the treatment of Heart Failure. This Care Team includes your primary Heart Failure Specialized Cardiologist (physician), Advanced Practice Providers (APPs- Physician Assistants and Nurse Practitioners), and Pharmacist who  all work together to provide you with the care you need, when you need it.   You may see any of the following providers on your designated Care Team at your next follow up:  Dr. Arvilla Meres Dr. Marca Ancona Dr. Marcos Eke, NP Robbie Lis, [place] [facility] [place], [place] Brynda Peon, NP Karle Plumber, PharmD   Please be sure to bring in all your medications bottles to every appointment.   Need to Contact us:  If you have any questions or concerns before your next appointment please send [place] a message through [place] or call our office at [phone].    TO LEAVE A MESSAGE FOR THE NURSE SELECT OPTION 2, PLEASE LEAVE A MESSAGE INCLUDING: YOUR NAME DATE OF BIRTH CALL BACK NUMBER REASON FOR CALL**this is important as we prioritize the call backs  YOU WILL RECEIVE A CALL BACK THE SAME DAY AS LONG AS YOU CALL BEFORE 4:00 PM

## 2023-06-23 DIAGNOSIS — R109 Unspecified abdominal pain: Secondary | ICD-10-CM | POA: Diagnosis not present

## 2023-07-07 DIAGNOSIS — I493 Ventricular premature depolarization: Secondary | ICD-10-CM | POA: Diagnosis not present

## 2023-07-22 NOTE — Addendum Note (Signed)
Encounter addended by: Crissie Figures, RN on: 07/22/2023 3:04 PM  Actions taken: Imaging Exam ended

## 2023-07-27 ENCOUNTER — Ambulatory Visit: Payer: 59 | Admitting: Podiatry

## 2023-07-27 ENCOUNTER — Encounter: Payer: Self-pay | Admitting: Podiatry

## 2023-07-27 DIAGNOSIS — M79675 Pain in left toe(s): Secondary | ICD-10-CM

## 2023-07-27 DIAGNOSIS — B351 Tinea unguium: Secondary | ICD-10-CM | POA: Diagnosis not present

## 2023-07-27 DIAGNOSIS — E1142 Type 2 diabetes mellitus with diabetic polyneuropathy: Secondary | ICD-10-CM | POA: Diagnosis not present

## 2023-07-27 DIAGNOSIS — M79674 Pain in right toe(s): Secondary | ICD-10-CM | POA: Diagnosis not present

## 2023-07-27 DIAGNOSIS — Z23 Encounter for immunization: Secondary | ICD-10-CM | POA: Diagnosis not present

## 2023-07-27 NOTE — Progress Notes (Signed)
Subjective:  Patient ID: Deborah Weeks, female    DOB: [birth year],  MRN: [id number]  77 y.o. female presents at risk foot care with history of diabetic neuropathy and painful, discolored, thick toenails which interfere with daily activities. Chief Complaint  Patient presents with   Diabetes    [facility]- no issues BS- didn't check A1C-7.4 8/25 PCP-will see in 6 months 3/25   New problem(s): None   PCP is Aliene Beams, MD , and last visit was Feb 18, 2023.  Allergies  Allergen Reactions   Lexapro [Escitalopram Oxalate] Other (See Comments)    Insomnia, but patient currently takes this   Lipitor [Atorvastatin] Other (See Comments)    Cramps    Escitalopram     Other reaction(s): keeps her up at night   Iodinated Contrast Media Nausea And Vomiting   Xanax [Alprazolam] Other (See Comments)    "Felt like I had a hangover", HA   Codeine Nausea And Vomiting    Other reaction(s): vomiting   Neurontin [Gabapentin] Other (See Comments)    Makes pt shake    Niacin And Related Itching and Rash   Spironolactone Other (See Comments)    hyperkalemia    Review of Systems: Negative except as noted in the HPI.   Objective:  Deborah Weeks is a pleasant 77 y.o. female WD, WN in NAD.Marland Kitchen AAO x 3.  Vascular Examination: Vascular status intact b/l with palpable pedal pulses. CFT immediate b/l. Pedal hair present. No edema. No pain with calf compression b/l. Skin temperature gradient WNL b/l. No varicosities noted. No cyanosis or clubbing noted.  Neurological Examination: Sensation grossly intact b/l with 10 gram monofilament. Vibratory sensation intact b/l.  Dermatological Examination: Pedal skin with normal turgor, texture and tone b/l. No open wounds nor interdigital macerations noted. Toenails 1-5 b/l thick, discolored, elongated with subungual debris and pain on dorsal palpation. No hyperkeratotic lesions noted b/l.   Musculoskeletal Examination: Muscle strength 5/5 to b/l LE.  No  pain, crepitus noted b/l. No gross pedal deformities. Patient ambulates independently without assistive aids.   Radiographs: None  Assessment:   1. Pain due to onychomycosis of toenails of both feet   2. Diabetic polyneuropathy associated with type 2 diabetes mellitus (HCC)    Plan:  -Patient was evaluated and treated. All patient's and/or POA's questions/concerns answered on today's visit. -Continue foot and shoe inspections daily. Monitor blood glucose per PCP/Endocrinologist's recommendations. -Continue supportive shoe gear daily. -Mycotic toenails 1-5 bilaterally were debrided in length and girth with sterile nail nippers and dremel without incident. -Patient/POA to call should there be question/concern in the interim.  Return in about 3 months (around 10/27/2023).  Freddie Breech, DPM

## 2023-08-01 ENCOUNTER — Telehealth (HOSPITAL_COMMUNITY): Payer: Self-pay | Admitting: *Deleted

## 2023-08-01 NOTE — Telephone Encounter (Signed)
Advised pt insurance does not require PA for home sleep test [facility]) and she is good to proceed with testing. She is agreeable and verbalized understanding.

## 2023-08-02 ENCOUNTER — Encounter (INDEPENDENT_AMBULATORY_CARE_PROVIDER_SITE_OTHER): Payer: 59 | Admitting: Cardiology

## 2023-08-02 DIAGNOSIS — G4734 Idiopathic sleep related nonobstructive alveolar hypoventilation: Secondary | ICD-10-CM | POA: Diagnosis not present

## 2023-08-07 ENCOUNTER — Other Ambulatory Visit (HOSPITAL_COMMUNITY): Payer: Self-pay | Admitting: Internal Medicine

## 2023-08-09 ENCOUNTER — Telehealth: Payer: Self-pay

## 2023-08-09 ENCOUNTER — Ambulatory Visit: Payer: 59 | Attending: Internal Medicine

## 2023-08-09 DIAGNOSIS — I10 Essential (primary) hypertension: Secondary | ICD-10-CM

## 2023-08-09 DIAGNOSIS — I5032 Chronic diastolic (congestive) heart failure: Secondary | ICD-10-CM

## 2023-08-09 NOTE — Procedures (Signed)
SLEEP STUDY REPORT Patient Information Study Date: 08/02/2023 Patient Name: Deborah Weeks Patient ID: [id number] Birth Date: 06-02-1946 Age: 77 Gender: Female BMI: 31.7 (W=214 lb, H=5' 9'') Stopbang: 4 Referring Physician: Nicholes Mango, MD  TEST DESCRIPTION: Home sleep apnea testing was completed using the WatchPat, a Type 1 device, utilizing peripheral arterial tonometry (PAT), chest movement, actigraphy, pulse oximetry, pulse rate, body position and snore. AHI was calculated with apnea and hypopnea using valid sleep time as the denominator. RDI includes apneas, hypopneas, and RERAs. The data acquired and the scoring of sleep and all associated events were performed in accordance with the recommended standards and specifications as outlined in the AASM Manual for the Scoring of Sleep and Associated Events 2.2.0 (2015).  FINDINGS: 1. No evidence of Obstructive Sleep Apnea with AHI 3.7/hr. 2. No Central Sleep Apnea. 3. Oxygen desaturations as low as 81%. 4. Mild snoring was present. O2 sats were < 88% for 275.9 minutes. 5. Total sleep time was 8 hrs and 33 min. 6. 22.1% of total sleep time was spent in REM sleep. 7. Normal sleep onset latency at 16 min. 8. Prolonged REM sleep onset latency at 152 min. 9. Total awakenings were 2.  DIAGNOSIS: Nocturnal Hypoxemia  RECOMMENDATIONS: Recommend in lab PSG  Signature: Armanda Magic, MD; [facility]; Diplomat, American Board of Sleep Medicine Electronically Signed: 08/09/2023 4:58:55 AM

## 2023-08-09 NOTE — Telephone Encounter (Signed)
Notified patient of sleep study results and recommendations. All questions were answered. Patient verbalized understanding. Patient stated she currently uses Oxygen at night, but did not use her oxygen the night of her Sleep Study.

## 2023-08-09 NOTE — Telephone Encounter (Signed)
-----   Message from Armanda Magic sent at 08/09/2023  5:00 AM EDT ----- NO evidence of OSA but has severe nocturnal hypoxemia - please set up in lab PSG

## 2023-08-10 ENCOUNTER — Telehealth: Payer: Self-pay | Admitting: *Deleted

## 2023-08-10 ENCOUNTER — Telehealth: Payer: Self-pay

## 2023-08-10 DIAGNOSIS — G4733 Obstructive sleep apnea (adult) (pediatric): Secondary | ICD-10-CM

## 2023-08-10 DIAGNOSIS — K625 Hemorrhage of anus and rectum: Secondary | ICD-10-CM | POA: Diagnosis not present

## 2023-08-10 NOTE — Telephone Encounter (Signed)
Good Morning Dr. Gala Romney  We have received a surgical clearance request for Deborah Weeks for colonoscopy procedure. They were seen recently in clinic on 06/21/2023.  She has a PMH of PAF, HTN, DM type II, lupus, chronic diastolic CHF, WHO group 2 PHT.  Can you please comment on surgical clearance being patient would be acceptable risk for scheduled procedure.  Please forward you guidance and recommendations to P CV DIV PREOP   Thanks, Deborah Searing, NP

## 2023-08-10 NOTE — Telephone Encounter (Signed)
Notified patient of provider recommendations.  Al questions answered and patient verbalized understanding.PSG ordered today.

## 2023-08-10 NOTE — Telephone Encounter (Signed)
Pre-operative Risk Assessment    Patient Name: Deborah Weeks  DOB: 09/11/1946 MRN: [id number]  DATE OF LAST VISIT: 06/21/23 DR. Gala Romney DATE OF NEXT VISIT: NONE    Request for Surgical Clearance    Procedure:   COLONOSCOPY  Date of Surgery:  Clearance 09/14/23                                 Surgeon: DR. MARC MAGOD Surgeon's Group or Practice Name:  [facility] Phone number:  [phone] Fax number:  [phone]   Type of Clearance Requested:   - Medical  - Pharmacy:  Hold Apixaban (Eliquis)     Type of Anesthesia:   PROPOFOL   Additional requests/questions:    Deborah Weeks   08/10/2023, 11:32 AM

## 2023-08-10 NOTE — Telephone Encounter (Signed)
Patient with diagnosis of afib on Eliquis for anticoagulation.    Procedure: colonoscopy Date of procedure: 09/14/23  CHA2DS2-VASc Score = 7  This indicates a 11.2% annual risk of stroke. The patient's score is based upon: CHF History: 1 HTN History: 1 Diabetes History: 1 Stroke History: 0 Vascular Disease History: 1 Age Score: 2 Gender Score: 1   CrCl 50mL/min Platelet count 330K  Per office protocol, patient can hold Eliquis for 1 day prior to procedure.    **This guidance is not considered finalized until pre-operative APP has relayed final recommendations.**

## 2023-08-10 NOTE — Telephone Encounter (Signed)
Pharmacy please advise on holding Eliquis prior to colonoscopy scheduled for 12//24. Thank you.

## 2023-08-19 NOTE — Telephone Encounter (Signed)
   Patient Name: Deborah Weeks  DOB: August 08, 1946 MRN: [id number]  Primary Cardiologist: Donato Schultz, MD  Chart reviewed as part of pre-operative protocol coverage. Given past medical history and time since last visit, based on ACC/AHA guidelines, RALENE COLGROVE is at acceptable risk for the planned procedure without further cardiovascular testing.   The patient was advised that if she develops new symptoms prior to surgery to contact our office to arrange for a follow-up visit, and she verbalized understanding.  Per office protocol, patient can hold Eliquis for 1 day prior to procedure.    I will route this recommendation to the requesting party via Epic fax function and remove from pre-op pool.  Please call with questions.  Napoleon Form, Leodis Rains, NP 08/19/2023, 5:28 PM

## 2023-09-05 ENCOUNTER — Telehealth: Payer: Self-pay | Admitting: Cardiology

## 2023-09-05 NOTE — Telephone Encounter (Signed)
I will forward to pre op APP for review.       Delane Ginger, Beverly1 hour ago (11:39 AM)   BG Patient is wanting to know if she do trilyte. Please advise      Note   [facility] [phone]  Lorette Ang hours ago (11:31 AM)   Babette Relic  Incoming call

## 2023-09-05 NOTE — Telephone Encounter (Signed)
Will fax to GI office, as pre op APP states this is a question for GI office.

## 2023-09-05 NOTE — Telephone Encounter (Addendum)
Patient is wanting to know if she do trilyte. Please advise

## 2023-09-14 DIAGNOSIS — Z09 Encounter for follow-up examination after completed treatment for conditions other than malignant neoplasm: Secondary | ICD-10-CM | POA: Diagnosis not present

## 2023-09-14 DIAGNOSIS — K921 Melena: Secondary | ICD-10-CM | POA: Diagnosis not present

## 2023-09-14 DIAGNOSIS — D124 Benign neoplasm of descending colon: Secondary | ICD-10-CM | POA: Diagnosis not present

## 2023-09-14 DIAGNOSIS — D123 Benign neoplasm of transverse colon: Secondary | ICD-10-CM | POA: Diagnosis not present

## 2023-09-14 DIAGNOSIS — K635 Polyp of colon: Secondary | ICD-10-CM | POA: Diagnosis not present

## 2023-09-14 DIAGNOSIS — Z860100 Personal history of colon polyps, unspecified: Secondary | ICD-10-CM | POA: Diagnosis not present

## 2023-09-14 DIAGNOSIS — K5730 Diverticulosis of large intestine without perforation or abscess without bleeding: Secondary | ICD-10-CM | POA: Diagnosis not present

## 2023-09-16 DIAGNOSIS — D123 Benign neoplasm of transverse colon: Secondary | ICD-10-CM | POA: Diagnosis not present

## 2023-09-16 DIAGNOSIS — K635 Polyp of colon: Secondary | ICD-10-CM | POA: Diagnosis not present

## 2023-09-16 DIAGNOSIS — D124 Benign neoplasm of descending colon: Secondary | ICD-10-CM | POA: Diagnosis not present

## 2023-11-01 ENCOUNTER — Telehealth: Payer: Self-pay

## 2023-11-01 NOTE — Telephone Encounter (Signed)
**Note De-Identified Si Jachim Obfuscation** Per the [facility] Provider Portal: Procedure code 78469 Description Polysomnography; age 78 years or older, sleep staging with 4 or more additional parameters of sleep, attended by a technologist Inquiry summary Notification/Prior Authorization not required for this service.  I have forwarded the pts NPSG order to the sleep lab so they can contact the pt to schedule this NPSG.

## 2023-11-15 ENCOUNTER — Ambulatory Visit (INDEPENDENT_AMBULATORY_CARE_PROVIDER_SITE_OTHER): Payer: 59 | Admitting: Podiatry

## 2023-11-15 ENCOUNTER — Encounter: Payer: Self-pay | Admitting: Podiatry

## 2023-11-15 VITALS — Ht 69.0 in

## 2023-11-15 DIAGNOSIS — L84 Corns and callosities: Secondary | ICD-10-CM

## 2023-11-15 DIAGNOSIS — B351 Tinea unguium: Secondary | ICD-10-CM

## 2023-11-15 DIAGNOSIS — E119 Type 2 diabetes mellitus without complications: Secondary | ICD-10-CM | POA: Diagnosis not present

## 2023-11-15 DIAGNOSIS — M79674 Pain in right toe(s): Secondary | ICD-10-CM

## 2023-11-15 DIAGNOSIS — M79675 Pain in left toe(s): Secondary | ICD-10-CM | POA: Diagnosis not present

## 2023-11-15 DIAGNOSIS — E1142 Type 2 diabetes mellitus with diabetic polyneuropathy: Secondary | ICD-10-CM

## 2023-11-16 ENCOUNTER — Other Ambulatory Visit: Payer: Self-pay | Admitting: Family Medicine

## 2023-11-16 DIAGNOSIS — Z1231 Encounter for screening mammogram for malignant neoplasm of breast: Secondary | ICD-10-CM

## 2023-11-21 NOTE — Progress Notes (Signed)
 ANNUAL DIABETIC FOOT EXAM  Subjective: Deborah Weeks presents today for annual diabetic foot exam.  Chief Complaint  Patient presents with   [facility]    She is here for nail trim and callous, Last A1C was 6.9, PCP is Dr> Rolinda and last seen about 3 months ago.    Patient confirms h/o diabetes.  Patient denies any h/o foot wounds.  Patient has been diagnosd with neuropathy.  Rolinda Millman, MD is patient's PCP.  Past Medical History:  Diagnosis Date   Arthritis    Diabetes mellitus without complication (HCC)    High cholesterol    Hypertension    Lupus    Neuropathy    Patient Active Problem List   Diagnosis Date Noted   Anxiety about health 05/25/2022   Chronic diastolic heart failure (HCC) 05/25/2022   Chronic kidney disease, stage 3b (HCC) 05/25/2022   External hemorrhoids 05/25/2022   Neuropathy 05/25/2022   Oxygen  dependent 05/25/2022   History of colonic polyps 05/25/2022   Pure hypercholesterolemia 05/25/2022   Other secondary pulmonary hypertension (HCC) 01/15/2022   COPD (chronic obstructive pulmonary disease) (HCC) 01/15/2022   Dyspnea 12/29/2021   HLD (hyperlipidemia)    Abnormal urinalysis 10/15/2018   Hypokalemia 10/15/2018   Chest pain 10/15/2018   Chronic respiratory failure with hypoxia (HCC) 10/14/2018   Recurrent major depressive disorder, in full remission (HCC) 10/28/2014   Multinodular goiter 08/30/2012   Allergic rhinitis 05/30/2012   DM (diabetes mellitus) (HCC) 01/05/2011   Combined hyperlipidemia associated with type 2 diabetes mellitus (HCC) 01/05/2011   Controlled type 2 diabetes mellitus without complication (HCC) 01/04/2011   Hypertension 01/04/2011   Controlled type 2 diabetes mellitus with diabetic polyneuropathy, without long-term current use of insulin  (HCC) 01/04/2011   Osteopenia 12/07/2010   Glaucoma 02/27/2010   History of neoplasm of bladder 02/27/2010   Systemic lupus erythematosus (HCC) 02/27/2010   Past Surgical  History:  Procedure Laterality Date   BACK SURGERY     LAPAROSCOPIC TUBAL LIGATION     RIGHT/LEFT HEART CATH AND CORONARY ANGIOGRAPHY N/A 02/24/2022   Procedure: RIGHT/LEFT HEART CATH AND CORONARY ANGIOGRAPHY;  Surgeon: Cherrie Toribio SAUNDERS, MD;  Location: [facility] INVASIVE [facility] LAB;  Service: Cardiovascular;  Laterality: N/A;   Current Outpatient Medications on File Prior to Visit  Medication Sig Dispense Refill   acetaminophen  (TYLENOL ) 500 MG tablet Take 1,000 mg by mouth every 8 (eight) hours as needed.     amLODipine  (NORVASC ) 5 MG tablet Take 5 mg by mouth daily.     apixaban  (ELIQUIS ) 5 MG TABS tablet Take 1 tablet (5 mg total) by mouth 2 (two) times daily. 60 tablet 6   Blood Glucose Monitoring Suppl (ONETOUCH VERIO FLEX SYSTEM) w/Device KIT 2 (two) times daily.     carvedilol  (COREG ) 12.5 MG tablet TAKE 1 TABLET BY MOUTH TWICE A DAY 180 tablet 3   Cholecalciferol (VITAMIN D3 PO) Take 1,000 mg by mouth daily in the afternoon.     Cyanocobalamin  (VITAMIN B12) 1000 MCG TBCR Take 1,000 mcg by mouth daily.     furosemide  (LASIX ) 20 MG tablet TAKE 1 TABLET BY MOUTH EVERY DAY 90 tablet 0   GLYXAMBI 10-5 MG TABS Take 1 tablet by mouth every morning.     hydrALAZINE  (APRESOLINE ) 25 MG tablet Take 25 mg by mouth 2 (two) times daily.     ketoconazole  (NIZORAL ) 2 % cream Apply to both feet and between toes once daily for 6 weeks. 60 g 1   Lancets (ONETOUCH  DELICA PLUS LANCET33G) MISC 3 (three) times daily.     ondansetron  (ZOFRAN -ODT) 4 MG disintegrating tablet Take 1 tablet (4 mg total) by mouth every 8 (eight) hours as needed for nausea or vomiting. 20 tablet 0   ONETOUCH VERIO test strip 3 (three) times daily.     pregabalin  (LYRICA ) 75 MG capsule Take 75 mg by mouth 2 (two) times daily.     pregabalin  (LYRICA ) 75 MG capsule Take 100 mg by mouth at bedtime.     spironolactone  (ALDACTONE ) 25 MG tablet Take 0.5 tablets (12.5 mg total) by mouth daily. 15 tablet 3   Current Facility-Administered  Medications on File Prior to Visit  Medication Dose Route Frequency Provider Last Rate Last Admin   triamcinolone  acetonide (KENALOG ) 10 MG/ML injection 10 mg  10 mg Other Once Stover, Titorya, DPM        Allergies  Allergen Reactions   Lexapro  [Escitalopram  Oxalate] Other (See Comments)    Insomnia, but patient currently takes this   Lipitor [Atorvastatin] Other (See Comments)    Cramps    Escitalopram      Other reaction(s): keeps her up at night   Iodinated Contrast Media Nausea And Vomiting   Xanax [Alprazolam] Other (See Comments)    Felt like I had a hangover, HA   Codeine Nausea And Vomiting    Other reaction(s): vomiting   Neurontin [Gabapentin] Other (See Comments)    Makes pt shake    Niacin And Related Itching and Rash   Spironolactone  Other (See Comments)    hyperkalemia   Social History   Occupational History   Not on file  Tobacco Use   Smoking status: Former    Current packs/day: 0.00    Average packs/day: 1 pack/day for 40.0 years (40.0 ttl pk-yrs)    Types: Cigarettes    Start date: 10/11/1976    Quit date: 10/11/2016    Years since quitting: 7.1   Smokeless tobacco: Never  Vaping Use   Vaping status: Never Used  Substance and Sexual Activity   Alcohol use: Never    Alcohol/week: 0.0 standard drinks of alcohol   Drug use: Never   Sexual activity: Not on file   Family History  Problem Relation Age of Onset   Diabetes Mother    Hypertension Mother    Diabetes Father    Hypertension Father    Breast cancer Cousin    Alpha-1 antitrypsin deficiency Neg Hx    Lupus Neg Hx    Neurofibromatosis Neg Hx    Coronary artery disease Neg Hx    Immunization History  Administered Date(s) Administered   Influenza Split 08/19/2010   Influenza, High Dose Seasonal PF 08/30/2012, 07/24/2014, 06/16/2021   Influenza,inj,Quad PF,6+ Mos 09/14/2017   Influenza-Unspecified 06/22/2013   PFIZER(Purple Top)SARS-COV-2 Vaccination 06/12/2020, 07/03/2020, 12/31/2020    Pneumococcal Conjugate-13 09/10/2010, 12/14/2017   Pneumococcal Polysaccharide-23 09/10/2010, 10/31/2015   Tdap 10/11/2002, 05/13/2021   Zoster, Live 09/28/2010     Review of Systems: Negative except as noted in the HPI.   Objective: There were no vitals filed for this visit.  LLESENIA FOGAL is a pleasant 78 y.o. female in NAD. AAO X 3.  Title   Diabetic Foot Exam - detailed Date & Time: 11/15/2023  1:45 PM Diabetic Foot exam was performed with the following findings: Yes  Visual Foot Exam completed.: Yes  Is there a history of foot ulcer?: No Is there a foot ulcer now?: No Is there swelling?: No Is there elevated skin temperature?:  No Is there abnormal foot shape?: No Is there a claw toe deformity?: No Are the toenails long?: Yes Are the toenails thick?: Yes Are the toenails ingrown?: No Is the skin thin, fragile, shiny and hairless?: No Normal Range of Motion?: Yes Is there foot or ankle muscle weakness?: No Do you have pain in calf while walking?: No Are the shoes appropriate in style and fit?: Yes Can the patient see the bottom of their feet?: Yes Pulse Foot Exam completed.: Yes   Right Posterior Tibialis: Present Left posterior Tibialis: Present   Right Dorsalis Pedis: Present Left Dorsalis Pedis: Present     Sensory Foot Exam Completed.: Yes Semmes-Weinstein Monofilament Test + means has sensation and - means no sensation  R Foot Test Control: Pos L Foot Test Control: Pos   R Site 1-Great Toe: Pos L Site 1-Great Toe: Pos   R Site 4: Pos L Site 4: Pos   R site 5: Pos L Site 5: Pos  R Site 6: Pos L Site 6: Pos     Image components are not supported.   Image components are not supported. Image components are not supported.  T[place] Right vibratory: present Left vibratory: present  Comments Hyperkeratotic lesion(s) ball of both feet b/l.  No erythema, no edema, no drainage, no fluctuance.      Lab Results  Component Value Date   HGBA1C 7.5  (H) 09/22/2019   ADA Risk Categorization: Low Risk :  Patient has all of the following: Intact protective sensation No prior foot ulcer  No severe deformity Pedal pulses present  Assessment: 1. Pain due to onychomycosis of toenails of both feet   2. Callus   3. Diabetic polyneuropathy associated with type 2 diabetes mellitus (HCC)   4. Encounter for diabetic foot exam [facility])     Plan: -Patient was evaluated today. All questions/concerns addressed on today's visit. -Diabetic foot examination performed today. -Continue diabetic foot care principles: inspect feet daily, monitor glucose as recommended by PCP and/or Endocrinologist, and follow prescribed diet per PCP, Endocrinologist and/or dietician. -Patient to continue soft, supportive shoe gear daily. -Mycotic toenails 1-5 bilaterally were debrided in length and girth with sterile nail nippers and dremel without incident. -Callus(es) ball of both feet pared utilizing sterile scalpel blade without complication or incident. Total number debrided =2. -Patient/POA to call should there be question/concern in the interim. Return in about 3 months (around 02/12/2024).  Delon LITTIE Merlin, DPM      [place] LOCATION: [address], [place] [address]                   Office [phone]   [facility] LOCATION: [address] [place], [place] [address] Office [phone]

## 2023-11-25 ENCOUNTER — Emergency Department (HOSPITAL_COMMUNITY)
Admission: EM | Admit: 2023-11-25 | Discharge: 2023-11-26 | Disposition: A | Discharge status: Home / Self Care | Payer: 59 | Attending: Emergency Medicine | Admitting: Emergency Medicine

## 2023-11-25 ENCOUNTER — Encounter (HOSPITAL_COMMUNITY): Payer: Self-pay

## 2023-11-25 ENCOUNTER — Emergency Department (HOSPITAL_COMMUNITY): Payer: 59

## 2023-11-25 DIAGNOSIS — E119 Type 2 diabetes mellitus without complications: Secondary | ICD-10-CM | POA: Insufficient documentation

## 2023-11-25 DIAGNOSIS — Z79899 Other long term (current) drug therapy: Secondary | ICD-10-CM | POA: Diagnosis not present

## 2023-11-25 DIAGNOSIS — Z7901 Long term (current) use of anticoagulants: Secondary | ICD-10-CM | POA: Insufficient documentation

## 2023-11-25 DIAGNOSIS — R509 Fever, unspecified: Secondary | ICD-10-CM | POA: Diagnosis present

## 2023-11-25 DIAGNOSIS — R059 Cough, unspecified: Secondary | ICD-10-CM | POA: Diagnosis not present

## 2023-11-25 DIAGNOSIS — R918 Other nonspecific abnormal finding of lung field: Secondary | ICD-10-CM | POA: Diagnosis not present

## 2023-11-25 DIAGNOSIS — J101 Influenza due to other identified influenza virus with other respiratory manifestations: Secondary | ICD-10-CM | POA: Diagnosis not present

## 2023-11-25 DIAGNOSIS — I10 Essential (primary) hypertension: Secondary | ICD-10-CM | POA: Insufficient documentation

## 2023-11-25 LAB — CBC
HCT: 47.1 % — ABNORMAL HIGH (ref 36.0–46.0)
Hemoglobin: 15.1 g/dL — ABNORMAL HIGH (ref 12.0–15.0)
MCH: 25.4 pg — ABNORMAL LOW (ref 26.0–34.0)
MCHC: 32.1 g/dL (ref 30.0–36.0)
MCV: 79.3 fL — ABNORMAL LOW (ref 80.0–100.0)
Platelets: 227 10*3/uL (ref 150–400)
RBC: 5.94 MIL/uL — ABNORMAL HIGH (ref 3.87–5.11)
RDW: 14.8 % (ref 11.5–15.5)
WBC: 7.2 10*3/uL (ref 4.0–10.5)
nRBC: 0 % (ref 0.0–0.2)

## 2023-11-25 LAB — RESP PANEL BY RT-PCR (RSV, FLU A&B, COVID)  RVPGX2
Influenza A by PCR: POSITIVE — AB
Influenza B by PCR: NEGATIVE
Resp Syncytial Virus by PCR: NEGATIVE
SARS Coronavirus 2 by RT PCR: NEGATIVE

## 2023-11-25 LAB — BASIC METABOLIC PANEL
Anion gap: 10 (ref 5–15)
BUN: 17 mg/dL (ref 8–23)
CO2: 25 mmol/L (ref 22–32)
Calcium: 9.5 mg/dL (ref 8.9–10.3)
Chloride: 101 mmol/L (ref 98–111)
Creatinine, Ser: 1.67 mg/dL — ABNORMAL HIGH (ref 0.44–1.00)
GFR, Estimated: 31 mL/min — ABNORMAL LOW (ref >60)
Glucose, Bld: 149 mg/dL — ABNORMAL HIGH (ref 70–99)
Potassium: 4.6 mmol/L (ref 3.5–5.1)
Sodium: 136 mmol/L (ref 135–145)

## 2023-11-25 MED ORDER — ACETAMINOPHEN 325 MG PO TABS
650.0000 mg | ORAL_TABLET | Freq: Once | ORAL | Status: AC
  Administered 2023-11-25: 650 mg via ORAL
  Filled 2023-11-25: qty 2

## 2023-11-25 NOTE — ED Triage Notes (Signed)
Pt states that she has been feeling weak and dizzy all day with cough and fevers, grandchildren have been sick as well.

## 2023-11-26 DIAGNOSIS — J101 Influenza due to other identified influenza virus with other respiratory manifestations: Secondary | ICD-10-CM | POA: Diagnosis not present

## 2023-11-26 MED ORDER — OSELTAMIVIR PHOSPHATE 30 MG PO CAPS
30.0000 mg | ORAL_CAPSULE | Freq: Two times a day (BID) | ORAL | Status: AC
  Administered 2023-11-26: 30 mg via ORAL
  Filled 2023-11-26: qty 1

## 2023-11-26 MED ORDER — OSELTAMIVIR PHOSPHATE 30 MG PO CAPS: 30.0000 mg | ORAL_CAPSULE | Freq: Two times a day (BID) | ORAL | 0 refills | Status: AC

## 2023-11-26 NOTE — ED Notes (Signed)
Patient verbalizes understanding of discharge instructions. Opportunity for questioning and answers were provided. Armband removed by staff, pt discharged from ED. Ambulated out to lobby with daughter

## 2023-11-26 NOTE — ED Provider Notes (Signed)
[place] EMERGENCY DEPARTMENT AT [facility] Provider Note   CSN: [id number] Arrival date & time: 11/25/23  2142     History  Chief Complaint  Patient presents with   Weakness    Deborah Weeks is a 78 y.o. female.  78 year old female presents to the emergency department for evaluation of URI symptoms.  She notes cough as well as sore throat and subjective fever which has been waxing and waning since this morning.  This afternoon she developed worsening fatigue and malaise.  Has been utilizing Tylenol for symptoms which was last administered shortly after ED arrival at 2200.  States that she has been caring for her grandchildren who are sick with similar symptoms.  No vomiting, diarrhea, shortness of breath, syncope.  Patient does report receiving her flu shot this year.  The history is provided by the patient. No language interpreter was used.  Weakness      Home Medications Prior to Admission medications   Medication Sig Start Date End Date Taking? Authorizing Provider  oseltamivir (TAMIFLU) 30 MG capsule Take 1 capsule (30 mg total) by mouth 2 (two) times daily for 5 days. 11/26/23 12/01/23 Yes Antony Madura, PA-C  acetaminophen (TYLENOL) 500 MG tablet Take 1,000 mg by mouth every 8 (eight) hours as needed.    [provider]  amLODipine (NORVASC) 5 MG tablet Take 5 mg by mouth daily.    [provider]  apixaban (ELIQUIS) 5 MG TABS tablet Take 1 tablet (5 mg total) by mouth 2 (two) times daily. 05/20/22   Bensimhon, Bevelyn Buckles, MD  Blood Glucose Monitoring Suppl (ONETOUCH VERIO FLEX SYSTEM) w/Device KIT 2 (two) times daily. 05/23/19   [provider]  carvedilol (COREG) 12.5 MG tablet TAKE 1 TABLET BY MOUTH TWICE A DAY 08/08/23   Bensimhon, Bevelyn Buckles, MD  Cholecalciferol (VITAMIN D3 PO) Take 1,000 mg by mouth daily in the afternoon.    [provider]  Cyanocobalamin (VITAMIN B12) 1000 MCG TBCR Take 1,000 mcg by mouth daily. 05/19/21    [provider]  furosemide (LASIX) 20 MG tablet TAKE 1 TABLET BY MOUTH EVERY DAY 03/14/23   Jake Bathe, MD  GLYXAMBI 10-5 MG TABS Take 1 tablet by mouth every morning. 06/30/22   [provider]  hydrALAZINE (APRESOLINE) 25 MG tablet Take 25 mg by mouth 2 (two) times daily. 10/26/21   [provider]  ketoconazole (NIZORAL) 2 % cream Apply to both feet and between toes once daily for 6 weeks. 04/26/23   Freddie Breech, DPM  Lancets (ONETOUCH DELICA PLUS LANCET33G) MISC 3 (three) times daily. 05/25/19   [provider]  ondansetron (ZOFRAN-ODT) 4 MG disintegrating tablet Take 1 tablet (4 mg total) by mouth every 8 (eight) hours as needed for nausea or vomiting. 01/28/22   Sabas Sous, MD  [facility] VERIO test strip 3 (three) times daily. 05/24/19   [provider]  pregabalin (LYRICA) 75 MG capsule Take 75 mg by mouth 2 (two) times daily.    [provider]  pregabalin (LYRICA) 75 MG capsule Take 100 mg by mouth at bedtime.    [provider]  spironolactone (ALDACTONE) 25 MG tablet Take 0.5 tablets (12.5 mg total) by mouth daily. 02/09/22   Bensimhon, Bevelyn Buckles, MD      Allergies    Lexapro [escitalopram oxalate], Lipitor [atorvastatin], Escitalopram, Iodinated contrast media, Xanax [alprazolam], Codeine, Neurontin [gabapentin], Niacin and related, and Spironolactone    Review of Systems  Review of Systems  Neurological:  Positive for weakness.  Ten systems reviewed and are negative for acute change, except as noted in the HPI.    Physical Exam Updated Vital Signs BP 124/64   Pulse 72   Temp 100.3 F (37.9 C) (Oral)   Resp 20   Wt 97 kg   LMP  (LMP Unknown)   SpO2 97%   BMI 31.58 kg/m   Physical Exam Vitals and nursing note reviewed.  Constitutional:      General: She is not in acute distress.    Appearance: She is well-developed. She is not diaphoretic.     Comments: Appears fatigued, but nontoxic.  HENT:      Head: Normocephalic and atraumatic.  Eyes:     General: No scleral icterus.    Conjunctiva/sclera: Conjunctivae normal.  Cardiovascular:     Rate and Rhythm: Normal rate and regular rhythm.     Pulses: Normal pulses.  Pulmonary:     Effort: Pulmonary effort is normal. No respiratory distress.     Breath sounds: No stridor.     Comments: Respirations even and unlabored Musculoskeletal:        General: Normal range of motion.     Cervical back: Normal range of motion.  Skin:    General: Skin is warm and dry.     Coloration: Skin is not pale.     Findings: No erythema or rash.  Neurological:     Mental Status: She is alert and oriented to person, place, and time.     Coordination: Coordination normal.     Comments: Moving all extremities spontaneously   Psychiatric:        Behavior: Behavior normal.     ED Results / Procedures / Treatments   Labs (all labs ordered are listed, but only abnormal results are displayed) Labs Reviewed  RESP PANEL BY RT-PCR (RSV, FLU A&B, COVID)  RVPGX2 - Abnormal; Notable for the following components:      Result Value   Influenza A by PCR POSITIVE (*)    All other components within normal limits  CBC - Abnormal; Notable for the following components:   RBC 5.94 (*)    Hemoglobin 15.1 (*)    HCT 47.1 (*)    MCV 79.3 (*)    MCH 25.4 (*)    All other components within normal limits  BASIC METABOLIC PANEL - Abnormal; Notable for the following components:   Glucose, Bld 149 (*)    Creatinine, Ser 1.67 (*)    GFR, Estimated 31 (*)    All other components within normal limits    EKG None  Radiology DG Chest Portable 1 View Result Date: 11/25/2023 CLINICAL DATA:  cough weakness. EXAM: PORTABLE CHEST 1 VIEW COMPARISON:  Chest x-ray 01/27/2022 FINDINGS: The heart and mediastinal contours are within normal limits. Atherosclerotic plaque. Vague left base airspace opacity likely due to patient rotation. No pulmonary edema. No pleural effusion. No  pneumothorax. No acute osseous abnormality. IMPRESSION: Vague left base airspace opacity likely due to patient rotation may represent atelectasis. Recommend repeat PA and lateral view of chest for further evaluation. Electronically Signed   By: Tish Frederickson M.D.   On: 11/25/2023 22:49    Procedures Procedures    Medications Ordered in ED Medications  acetaminophen (TYLENOL) tablet 650 mg (650 mg Oral Given 11/25/23 2202)  oseltamivir (TAMIFLU) capsule 30 mg (30 mg Oral Given 11/26/23 0202)    ED Course/ Medical Decision Making/ A&P Clinical Course as  of 11/26/23 0216  Sat Nov 26, 2023  0143 Spoke with ED pharmacist regarding Tamiflu dosing. Recommendations are for 30mg  BID x 5 days given CKD hx and creatinine clearance.  [KH]    Clinical Course User Index [KH] Antony Madura, PA-C                                 Medical Decision Making Amount and/or Complexity of Data Reviewed Labs: ordered.  Risk OTC drugs. Prescription drug management.   This patient presents to the ED for concern of fatigue, malaise and cough, this involves an extensive number of treatment options, and is a complaint that carries with it a high risk of complications and morbidity.  The differential diagnosis includes viral illness vs pleural effusion vs PNA vs CHF   Co morbidities that complicate the patient evaluation  HTN DM Lupus HLD   Additional history obtained:  Additional history obtained from family, at bedside.   Lab Tests:  I Ordered, and personally interpreted labs.  The pertinent results include:  Hgb 15.1, Creatinine 1.67 (stable compared to baseline), Influenza A positive.   Imaging Studies ordered:  I ordered imaging studies including CXR  I independently visualized and interpreted imaging which showed suspected malpositioning vs atelectasis L lung base I agree with the radiologist interpretation   Cardiac Monitoring:  The patient was maintained on a cardiac monitor.  I  personally viewed and interpreted the cardiac monitored which showed an underlying rhythm of: NSR   Medicines ordered and prescription drug management:  I ordered medication including Tamiflu for influenza; APAP for fever.  Reevaluation of the patient after these medicines showed that the patient improved I have reviewed the patients home medicines and have made adjustments as needed   Test Considered:  Repeat CXR - felt low yield given lack of respiratory distress, hypoxia.   Problem List / ED Course:  As above Positive for influenza. Fever improving with antipyretics. No tachypnea, dyspnea, hypoxemia. Labs c/w baseline. Started on Tamiflu prior to discharge. Return precautions discussed and provided. Patient discharged in stable condition with no unaddressed concerns.    Reevaluation:  After the interventions noted above, I reevaluated the patient and found that they have :stayed the same   Social Determinants of Health:  Good social support; family at bedside   Dispostion:  After consideration of the diagnostic results and the patients response to treatment, I feel that the patent would benefit from Tamiflu course for symptoms. Encouraged tylenol for fever, body aches. Stable for outpatient reassessment by PCP.          Final Clinical Impression(s) / ED Diagnoses Final diagnoses:  Influenza A    Rx / DC Orders ED Discharge Orders          Ordered    oseltamivir (TAMIFLU) 30 MG capsule  2 times daily        11/26/23 0145              Antony Madura, PA-C 11/26/23 0300    Nira Conn, MD 11/26/23 [phone]

## 2023-11-26 NOTE — Discharge Instructions (Signed)
Take tylenol for fever or body aches. You have been prescribed Tamiflu. Your first dose was given in the ED prior to discharge. Take your next dose on the evening of 11/26/23 and then continue as prescribed until finished. Follow up with your primary care doctor. Return for worsening or other concerning symptoms.

## 2023-11-30 ENCOUNTER — Ambulatory Visit: Payer: 59

## 2023-12-09 ENCOUNTER — Ambulatory Visit
Admission: RE | Admit: 2023-12-09 | Discharge: 2023-12-09 | Disposition: A | Discharge status: Home / Self Care | Payer: 59 | Source: Ambulatory Visit | Attending: Family Medicine | Admitting: Family Medicine

## 2023-12-09 DIAGNOSIS — Z1231 Encounter for screening mammogram for malignant neoplasm of breast: Secondary | ICD-10-CM | POA: Diagnosis not present

## 2023-12-12 DIAGNOSIS — Z7185 Encounter for immunization safety counseling: Secondary | ICD-10-CM | POA: Diagnosis not present

## 2023-12-12 DIAGNOSIS — E7800 Pure hypercholesterolemia, unspecified: Secondary | ICD-10-CM | POA: Diagnosis not present

## 2023-12-12 DIAGNOSIS — G629 Polyneuropathy, unspecified: Secondary | ICD-10-CM | POA: Diagnosis not present

## 2023-12-12 DIAGNOSIS — E118 Type 2 diabetes mellitus with unspecified complications: Secondary | ICD-10-CM | POA: Diagnosis not present

## 2023-12-12 DIAGNOSIS — I480 Paroxysmal atrial fibrillation: Secondary | ICD-10-CM | POA: Diagnosis not present

## 2023-12-12 DIAGNOSIS — J449 Chronic obstructive pulmonary disease, unspecified: Secondary | ICD-10-CM | POA: Diagnosis not present

## 2023-12-12 DIAGNOSIS — R9389 Abnormal findings on diagnostic imaging of other specified body structures: Secondary | ICD-10-CM | POA: Diagnosis not present

## 2023-12-12 DIAGNOSIS — I10 Essential (primary) hypertension: Secondary | ICD-10-CM | POA: Diagnosis not present

## 2023-12-12 DIAGNOSIS — Z79899 Other long term (current) drug therapy: Secondary | ICD-10-CM | POA: Diagnosis not present

## 2023-12-14 ENCOUNTER — Ambulatory Visit
Admission: RE | Admit: 2023-12-14 | Discharge: 2023-12-14 | Disposition: A | Discharge status: Home / Self Care | Source: Ambulatory Visit | Attending: Family Medicine

## 2023-12-14 ENCOUNTER — Other Ambulatory Visit: Payer: Self-pay | Admitting: Family Medicine

## 2023-12-14 DIAGNOSIS — R928 Other abnormal and inconclusive findings on diagnostic imaging of breast: Secondary | ICD-10-CM | POA: Diagnosis not present

## 2023-12-16 ENCOUNTER — Encounter: Payer: Self-pay | Admitting: Emergency Medicine

## 2023-12-21 DIAGNOSIS — L259 Unspecified contact dermatitis, unspecified cause: Secondary | ICD-10-CM | POA: Diagnosis not present

## 2023-12-26 ENCOUNTER — Other Ambulatory Visit

## 2023-12-26 ENCOUNTER — Encounter

## 2023-12-28 ENCOUNTER — Ambulatory Visit (HOSPITAL_BASED_OUTPATIENT_CLINIC_OR_DEPARTMENT_OTHER): Payer: 59 | Attending: Cardiology | Admitting: Cardiology

## 2023-12-28 VITALS — Ht 69.0 in | Wt 210.0 lb

## 2023-12-28 DIAGNOSIS — G4733 Obstructive sleep apnea (adult) (pediatric): Secondary | ICD-10-CM | POA: Insufficient documentation

## 2023-12-28 DIAGNOSIS — R0683 Snoring: Secondary | ICD-10-CM | POA: Diagnosis not present

## 2023-12-28 DIAGNOSIS — G4734 Idiopathic sleep related nonobstructive alveolar hypoventilation: Secondary | ICD-10-CM

## 2023-12-28 DIAGNOSIS — G4736 Sleep related hypoventilation in conditions classified elsewhere: Secondary | ICD-10-CM | POA: Diagnosis not present

## 2024-01-08 NOTE — Procedures (Signed)
     [facility] [address] [place], [place] [address] Tel: [phone]   Fax: [phone]  Polysomnography Interpretation  Patient Name:  Deborah Weeks, Deborah Weeks Date:  12/28/2023 Referring Physician:  Dr. Armanda Magic  Indications for Polysomnography The patient is a 78 year-old Female who is 5\' 9"  and weighs 210.0 lbs. Her BMI equals 31.1.  A full night polysomnogram was performed to evaluate for obstructive sleep apnea.  Medication  No Medications.   Polysomnogram Data A full night polysomnogram recorded the standard physiologic parameters including EEG, EOG, EMG, EKG, nasal and oral airflow.  Respiratory parameters of chest and abdominal movements were recorded with Respiratory Inductance Plethysmography belts.  Oxygen saturation was recorded by pulse oximetry.   Sleep Architecture The total recording time of the polysomnogram was 438.7 minutes.  The total sleep time was 219.5 minutes.  The patient spent 8.7% of total sleep time in Stage N1, 89.5% in Stage N2, 0.0% in Stages N3, and 1.8% in REM.  Sleep latency was 48.9 minutes.  REM latency was 345.5 minutes.  Sleep Efficiency was 50.0%.  Wake after Sleep Onset time was 170.5 minutes.  Respiratory Events The polysomnogram revealed a presence of 0 obstructive,0 central, and 0 mixed apneas resulting in an Apnea index of 0 events per hour.  There were 4 hypopneas (>=3% desaturation and/or arousal) resulting in an Apnea\Hypopnea Index (AHI >=3% desaturation and/or arousal) of 1.1 events per hour.  There were 0 hypopneas (>=4% desaturation) resulting in an Apnea\Hypopnea Index (AHI >=4% desaturation) of 0 events per hour.  There were 8 Respiratory Effort Related Arousals resulting in a RERA index of 2.2 events per hour. The Respiratory Disturbance Index is 3.3 events per hour.  The snore index was 432.4 events per hour.  Mean oxygen saturation was 90.8%.  The lowest oxygen saturation during sleep was  85.0%.  Time spent <=88% oxygen saturation was 104.2 minutes (24.4%).  Limb Activity There were no total limb movements recorded  Cardiac Summary The average pulse rate was 64.7 bpm.  The minimum pulse rate was 52.0 bpm while the maximum pulse rate was 83.0 bpm.  Cardiac rhythm was normal/abnormal.  Diagnosis:  Nocturnal Hypoxemia  Recommendations:  Start O2 at 2L [place] nightly. Patient should be counseled in good sleep hygiene and avoid sleeping supine. Consider referral to ENT for evaluation of snoring. Recommend followup with Dr. Delton Coombes with Pulmonary    This study was personally reviewed and electronically signed by: Dr. Armanda Magic Accredited Board Certified in Sleep Medicine Date/Time: 01/08/2024 9:20PM

## 2024-02-14 ENCOUNTER — Encounter: Payer: Self-pay | Admitting: Podiatry

## 2024-02-14 ENCOUNTER — Ambulatory Visit (INDEPENDENT_AMBULATORY_CARE_PROVIDER_SITE_OTHER): Payer: 59 | Admitting: Podiatry

## 2024-02-14 VITALS — Ht 69.0 in | Wt 210.0 lb

## 2024-02-14 DIAGNOSIS — M79674 Pain in right toe(s): Secondary | ICD-10-CM | POA: Diagnosis not present

## 2024-02-14 DIAGNOSIS — M79675 Pain in left toe(s): Secondary | ICD-10-CM | POA: Diagnosis not present

## 2024-02-14 DIAGNOSIS — B351 Tinea unguium: Secondary | ICD-10-CM | POA: Diagnosis not present

## 2024-02-14 DIAGNOSIS — L84 Corns and callosities: Secondary | ICD-10-CM

## 2024-02-14 DIAGNOSIS — E1142 Type 2 diabetes mellitus with diabetic polyneuropathy: Secondary | ICD-10-CM

## 2024-02-19 NOTE — Progress Notes (Signed)
 Subjective:  Patient ID: Deborah Weeks, female    DOB: October 17, 1945,  MRN: [id number]  78 y.o. female presents at risk foot care with history of diabetic neuropathy and callus(es) of both feet and painful mycotic toenails that are difficult to trim. Painful toenails interfere with ambulation. Aggravating factors include wearing enclosed shoe gear. Pain is relieved with periodic professional debridement. Painful calluses are aggravated when weightbearing with and without shoegear. Pain is relieved with periodic professional debridement.  Chief Complaint  Patient presents with   Nail Problem    Pt is here for [facility] last A1C was 7 PCP is Dr Barbar Bonus and LOV was in April.    New problem(s): None   PCP is Dorena Gander, MD.  Allergies  Allergen Reactions   Lexapro  [Escitalopram  Oxalate] Other (See Comments)    Insomnia, but patient currently takes this   Lipitor [Atorvastatin] Other (See Comments)    Cramps    Escitalopram      Other reaction(s): keeps her up at night   Iodinated Contrast Media Nausea And Vomiting   Xanax [Alprazolam] Other (See Comments)    "Felt like I had a hangover", HA   Codeine Nausea And Vomiting    Other reaction(s): vomiting   Neurontin [Gabapentin] Other (See Comments)    Makes pt shake    Niacin And Related Itching and Rash   Spironolactone  Other (See Comments)    hyperkalemia    Review of Systems: Negative except as noted in the HPI.   Objective:  Deborah Weeks is a pleasant 78 y.o. female in NAD. AAO x 3.  Vascular Examination: Vascular status intact b/l with palpable pedal pulses. CFT immediate b/l. Pedal hair present. No edema. No pain with calf compression b/l. Skin temperature gradient WNL b/l. No varicosities noted. No cyanosis or clubbing noted.  Neurological Examination: Pt has subjective symptoms of neuropathy. Sensation grossly intact b/l with 10 gram monofilament. Vibratory sensation intact b/l.  Dermatological Examination: Pedal skin  with normal turgor, texture and tone b/l. No open wounds nor interdigital macerations noted. Toenails 1-5 b/l thick, discolored, elongated with subungual debris and pain on dorsal palpation. Hyperkeratotic lesion(s) submet head 2 b/l and submet head 3 b/l.  No erythema, no edema, no drainage, no fluctuance..   Musculoskeletal Examination: Muscle strength 5/5 to b/l LE.  No pain, crepitus noted b/l. No gross pedal deformities. Patient ambulates independently without assistive aids.   Radiographs: None  Last A1c:       No data to display           Assessment:   1. Pain due to onychomycosis of toenails of both feet   2. Callus   3. Diabetic polyneuropathy associated with type 2 diabetes mellitus (HCC)     Plan:  Consent given for treatment. Patient examined.All patient's and/or POA's questions/concerns addressed on today's visit. Toenails 1-5 debrided in length and girth without incident. Callus(es) submet head 2 b/l and submet head 3 b/l pared with sharp debridement without incident. Continue foot and shoe inspections daily. Monitor blood glucose per PCP/Endocrinologist's recommendations.Continue soft, supportive shoe gear daily. Report any pedal injuries to medical professional. Call office if there are any questions/concerns. -Patient/POA to call should there be question/concern in the interim.  Return in about 3 months (around 05/16/2024).  Luella Sager, DPM      [place] LOCATION: [address]  [place], [place] [address]                   Office [phone]   [facility] LOCATION: [address] [place], [place] [address] Office [phone]

## 2024-04-02 DIAGNOSIS — J302 Other seasonal allergic rhinitis: Secondary | ICD-10-CM | POA: Diagnosis not present

## 2024-05-23 ENCOUNTER — Ambulatory Visit (INDEPENDENT_AMBULATORY_CARE_PROVIDER_SITE_OTHER): Admitting: Podiatry

## 2024-05-23 ENCOUNTER — Encounter: Payer: Self-pay | Admitting: Podiatry

## 2024-05-23 DIAGNOSIS — M79675 Pain in left toe(s): Secondary | ICD-10-CM | POA: Diagnosis not present

## 2024-05-23 DIAGNOSIS — M79674 Pain in right toe(s): Secondary | ICD-10-CM

## 2024-05-23 DIAGNOSIS — E1142 Type 2 diabetes mellitus with diabetic polyneuropathy: Secondary | ICD-10-CM

## 2024-05-23 DIAGNOSIS — B351 Tinea unguium: Secondary | ICD-10-CM

## 2024-05-28 NOTE — Progress Notes (Signed)
  Subjective:  Patient ID: Deborah Weeks, female    DOB: November 16, 1945,  MRN: [id number]  Deborah Weeks presents to clinic today for at risk foot care with history of diabetic neuropathy and painful thick toenails that are difficult to trim. Pain interferes with ambulation. Aggravating factors include wearing enclosed shoe gear. Pain is relieved with periodic professional debridement.  Chief Complaint  Patient presents with   Diabetes    [facility] NIDDM A1C 7.5 toenail trim. Upcomming appointment with PCP 06/07/24.   New problem(s): None.   PCP is Rolinda Millman, MD.  Allergies  Allergen Reactions   Lexapro  [Escitalopram  Oxalate] Other (See Comments)    Insomnia, but patient currently takes this   Lipitor [Atorvastatin] Other (See Comments)    Cramps    Escitalopram      Other reaction(s): keeps her up at night   Iodinated Contrast Media Nausea And Vomiting   Xanax [Alprazolam] Other (See Comments)    Felt like I had a hangover, HA   Codeine Nausea And Vomiting    Other reaction(s): vomiting   Neurontin [Gabapentin] Other (See Comments)    Makes pt shake    Niacin And Related Itching and Rash   Spironolactone  Other (See Comments)    hyperkalemia    Review of Systems: Negative except as noted in the HPI.  Objective:  There were no vitals filed for this visit. Deborah Weeks is a pleasant 78 y.o. female WD, WN in NAD. AAO x 3.  Vascular Examination: Vascular status intact b/l with palpable pedal pulses. CFT immediate b/l. Pedal hair present. No edema. No pain with calf compression b/l. Skin temperature gradient WNL b/l. No varicosities noted. No cyanosis or clubbing noted.  Neurological Examination: Pt has subjective symptoms of neuropathy. Sensation grossly intact b/l with 10 gram monofilament. Vibratory sensation intact b/l.  Dermatological Examination: Pedal skin with normal turgor, texture and tone b/l. No open wounds nor interdigital macerations noted. Toenails 1-5 b/l  thick, discolored, elongated with subungual debris and pain on dorsal palpation. No hyperkeratotic nor porokeratotic lesions present on today's visit.  Musculoskeletal Examination: Muscle strength 5/5 to b/l LE.  No pain, crepitus noted b/l. No gross pedal deformities. Patient ambulates independently without assistive aids.   Radiographs: None  Assessment/Plan: 1. Pain due to onychomycosis of toenails of both feet   2. Diabetic polyneuropathy associated with type 2 diabetes mellitus (HCC)   Patient was evaluated and treated. All patient's and/or POA's questions/concerns addressed on today's visit. Mycotic toenails 1-5 debrided in length and girth without incident.  Continue daily foot inspections and monitor blood glucose per PCP/Endocrinologist's recommendations.Continue soft, supportive shoe gear daily. Report any pedal injuries to medical professional. Call office if there are any quesitons/concerns. -Patient/POA to call should there be question/concern in the interim.   Return in about 3 months (around 08/23/2024).  Deborah Weeks, DPM      [place] LOCATION: [address] [place] [address]                   Office [phone]   [facility] LOCATION: [address] [place], [place] [address] Office [phone]

## 2024-06-04 DIAGNOSIS — Z0000 Encounter for general adult medical examination without abnormal findings: Secondary | ICD-10-CM | POA: Diagnosis not present

## 2024-06-04 DIAGNOSIS — E118 Type 2 diabetes mellitus with unspecified complications: Secondary | ICD-10-CM | POA: Diagnosis not present

## 2024-06-04 DIAGNOSIS — Z136 Encounter for screening for cardiovascular disorders: Secondary | ICD-10-CM | POA: Diagnosis not present

## 2024-06-06 DIAGNOSIS — G629 Polyneuropathy, unspecified: Secondary | ICD-10-CM | POA: Diagnosis not present

## 2024-06-06 DIAGNOSIS — Z0000 Encounter for general adult medical examination without abnormal findings: Secondary | ICD-10-CM | POA: Diagnosis not present

## 2024-06-06 DIAGNOSIS — E7800 Pure hypercholesterolemia, unspecified: Secondary | ICD-10-CM | POA: Diagnosis not present

## 2024-06-06 DIAGNOSIS — I480 Paroxysmal atrial fibrillation: Secondary | ICD-10-CM | POA: Diagnosis not present

## 2024-06-06 DIAGNOSIS — I10 Essential (primary) hypertension: Secondary | ICD-10-CM | POA: Diagnosis not present

## 2024-06-06 DIAGNOSIS — I5032 Chronic diastolic (congestive) heart failure: Secondary | ICD-10-CM | POA: Diagnosis not present

## 2024-06-06 DIAGNOSIS — E118 Type 2 diabetes mellitus with unspecified complications: Secondary | ICD-10-CM | POA: Diagnosis not present

## 2024-07-11 DIAGNOSIS — Z23 Encounter for immunization: Secondary | ICD-10-CM | POA: Diagnosis not present

## 2024-09-05 ENCOUNTER — Ambulatory Visit: Admitting: Podiatry

## 2024-11-13 ENCOUNTER — Ambulatory Visit (INDEPENDENT_AMBULATORY_CARE_PROVIDER_SITE_OTHER): Admitting: Podiatry

## 2024-11-13 ENCOUNTER — Encounter: Payer: Self-pay | Admitting: Podiatry

## 2024-11-13 DIAGNOSIS — M79675 Pain in left toe(s): Secondary | ICD-10-CM

## 2024-11-13 DIAGNOSIS — M79674 Pain in right toe(s): Secondary | ICD-10-CM | POA: Diagnosis not present

## 2024-11-13 DIAGNOSIS — E1142 Type 2 diabetes mellitus with diabetic polyneuropathy: Secondary | ICD-10-CM

## 2024-11-13 DIAGNOSIS — B351 Tinea unguium: Secondary | ICD-10-CM

## 2025-02-13 ENCOUNTER — Ambulatory Visit: Admitting: Podiatry
